# Patient Record
Sex: Female | Born: 1960 | Race: Black or African American | Hispanic: No | Smoking: Former smoker
Health system: Southern US, Community
[De-identification: ages and names within clinical notes are randomized; demographics above are authoritative.]

## PROBLEM LIST (undated history)

## (undated) DIAGNOSIS — Z923 Personal history of irradiation: Secondary | ICD-10-CM

## (undated) DIAGNOSIS — E785 Hyperlipidemia, unspecified: Secondary | ICD-10-CM

## (undated) DIAGNOSIS — C3490 Malignant neoplasm of unspecified part of unspecified bronchus or lung: Secondary | ICD-10-CM

## (undated) DIAGNOSIS — C7931 Secondary malignant neoplasm of brain: Secondary | ICD-10-CM

## (undated) DIAGNOSIS — M4800 Spinal stenosis, site unspecified: Secondary | ICD-10-CM

## (undated) DIAGNOSIS — D509 Iron deficiency anemia, unspecified: Secondary | ICD-10-CM

## (undated) DIAGNOSIS — I10 Essential (primary) hypertension: Secondary | ICD-10-CM

## (undated) DIAGNOSIS — C7951 Secondary malignant neoplasm of bone: Secondary | ICD-10-CM

## (undated) DIAGNOSIS — D519 Vitamin B12 deficiency anemia, unspecified: Secondary | ICD-10-CM

## (undated) DIAGNOSIS — E43 Unspecified severe protein-calorie malnutrition: Secondary | ICD-10-CM

## (undated) HISTORY — PX: TUBAL LIGATION: SHX77

---

## 2000-07-24 ENCOUNTER — Emergency Department (HOSPITAL_COMMUNITY): Admission: EM | Admit: 2000-07-24 | Discharge: 2000-07-25 | Payer: Self-pay | Admitting: *Deleted

## 2000-07-26 ENCOUNTER — Emergency Department (HOSPITAL_COMMUNITY): Admission: EM | Admit: 2000-07-26 | Discharge: 2000-07-26 | Payer: Self-pay | Admitting: *Deleted

## 2003-01-28 ENCOUNTER — Emergency Department (HOSPITAL_COMMUNITY): Admission: EM | Admit: 2003-01-28 | Discharge: 2003-01-28 | Payer: Self-pay | Admitting: Emergency Medicine

## 2003-09-25 ENCOUNTER — Emergency Department (HOSPITAL_COMMUNITY): Admission: EM | Admit: 2003-09-25 | Discharge: 2003-09-25 | Payer: Self-pay | Admitting: Emergency Medicine

## 2005-08-08 ENCOUNTER — Ambulatory Visit (HOSPITAL_COMMUNITY): Admission: RE | Admit: 2005-08-08 | Discharge: 2005-08-08 | Payer: Self-pay | Admitting: Obstetrics and Gynecology

## 2006-03-20 ENCOUNTER — Emergency Department (HOSPITAL_COMMUNITY): Admission: EM | Admit: 2006-03-20 | Discharge: 2006-03-20 | Payer: Self-pay | Admitting: Emergency Medicine

## 2007-05-17 ENCOUNTER — Emergency Department (HOSPITAL_COMMUNITY): Admission: EM | Admit: 2007-05-17 | Discharge: 2007-05-17 | Payer: Self-pay | Admitting: Emergency Medicine

## 2010-03-05 ENCOUNTER — Encounter: Payer: Self-pay | Admitting: Family Medicine

## 2011-08-22 ENCOUNTER — Encounter (HOSPITAL_COMMUNITY): Payer: Self-pay | Admitting: *Deleted

## 2011-08-22 ENCOUNTER — Emergency Department (HOSPITAL_COMMUNITY): Payer: Self-pay

## 2011-08-22 ENCOUNTER — Emergency Department (HOSPITAL_COMMUNITY)
Admission: EM | Admit: 2011-08-22 | Discharge: 2011-08-22 | Disposition: A | Discharge status: Home / Self Care | Payer: Self-pay | Attending: Emergency Medicine | Admitting: Emergency Medicine

## 2011-08-22 DIAGNOSIS — R51 Headache: Secondary | ICD-10-CM | POA: Insufficient documentation

## 2011-08-22 DIAGNOSIS — F17200 Nicotine dependence, unspecified, uncomplicated: Secondary | ICD-10-CM | POA: Insufficient documentation

## 2011-08-22 DIAGNOSIS — I10 Essential (primary) hypertension: Secondary | ICD-10-CM | POA: Insufficient documentation

## 2011-08-22 DIAGNOSIS — R519 Headache, unspecified: Secondary | ICD-10-CM

## 2011-08-22 HISTORY — DX: Essential (primary) hypertension: I10

## 2011-08-22 MED ORDER — NAPROXEN 500 MG PO TABS: 500.0000 mg | ORAL_TABLET | Freq: Two times a day (BID) | ORAL | Status: AC

## 2011-08-22 MED ORDER — OXYCODONE-ACETAMINOPHEN 5-325 MG PO TABS: 1.0000 | ORAL_TABLET | Freq: Four times a day (QID) | ORAL | Status: AC | PRN

## 2011-08-22 MED ORDER — OXYCODONE-ACETAMINOPHEN 5-325 MG PO TABS
1.0000 | ORAL_TABLET | Freq: Once | ORAL | Status: AC
  Administered 2011-08-22: 1 via ORAL
  Filled 2011-08-22: qty 1

## 2011-08-22 NOTE — ED Notes (Signed)
Pain rt periorbital region,  No injury.  Alert,

## 2011-08-22 NOTE — ED Provider Notes (Signed)
History     CSN: [id number]  Arrival date & time 08/22/11  1553   First MD Initiated Contact with Patient 08/22/11 1618      Chief Complaint  Patient presents with  . Facial Pain    (Consider location/radiation/quality/duration/timing/severity/associated sxs/prior treatment) Patient is a 51 y.o. female presenting with headaches. The history is provided by the patient (the pt complains of pain on the right side of her face). No language interpreter was used.  Headache  This is a new problem. The current episode started 2 days ago. The problem occurs constantly. The problem has not changed since onset.The headache is associated with nothing. The pain is located in the right unilateral region. The quality of the pain is described as sharp. The pain is at a severity of 6/10. The pain does not radiate. She has tried acetaminophen for the symptoms. The treatment provided no relief.    Past Medical History  Diagnosis Date  . Hypertension     History reviewed. No pertinent past surgical history.  History reviewed. No pertinent family history.  History  Substance Use Topics  . Smoking status: Current Everyday Smoker  . Smokeless tobacco: Not on file  . Alcohol Use: No    OB History    Grav Para Term Preterm Abortions TAB SAB Ect Mult Living                  Review of Systems  Constitutional: Negative for fatigue.  HENT: Negative for congestion, sinus pressure and ear discharge.        Facial pain  Eyes: Negative for discharge.  Respiratory: Negative for cough.   Cardiovascular: Negative for chest pain.  Gastrointestinal: Negative for abdominal pain and diarrhea.  Genitourinary: Negative for frequency and hematuria.  Musculoskeletal: Negative for back pain.  Skin: Negative for rash.  Neurological: Positive for headaches. Negative for seizures.  Hematological: Negative.   Psychiatric/Behavioral: Negative for hallucinations.    Allergies  Review of patient's allergies  indicates no known allergies.  Home Medications   Current Outpatient Rx  Name Route Sig Dispense Refill  . AMLODIPINE BESYLATE 10 MG PO TABS Oral Take 10 mg by mouth daily.    . IBUPROFEN 200 MG PO TABS Oral Take 800 mg by mouth as needed. For pain    . NAPROXEN 500 MG PO TABS Oral Take 1 tablet (500 mg total) by mouth 2 (two) times daily. 30 tablet 0  . OXYCODONE-ACETAMINOPHEN 5-325 MG PO TABS Oral Take 1 tablet by mouth every 6 (six) hours as needed for pain. 30 tablet 0    BP 145/84  Pulse 79  Temp 98.2 F (36.8 C) (Oral)  Resp 20  Ht 5\' 6"  (1.676 m)  Wt 160 lb (72.576 kg)  BMI 25.82 kg/m2  SpO2 98%  Physical Exam  Constitutional: She is oriented to person, place, and time. She appears well-developed.  HENT:  Head: Normocephalic and atraumatic.       Mild tenderness right cheek  Eyes: Conjunctivae and EOM are normal. No scleral icterus.  Neck: Neck supple. No thyromegaly present.  Cardiovascular: Normal rate and regular rhythm.  Exam reveals no gallop and no friction rub.   No murmur heard. Pulmonary/Chest: No stridor. She has no wheezes. She has no rales. She exhibits no tenderness.  Abdominal: She exhibits no distension. There is no tenderness. There is no rebound.  Musculoskeletal: Normal range of motion. She exhibits no edema.  Lymphadenopathy:    She has no cervical adenopathy.  Neurological: She is oriented to person, place, and time. Coordination normal.  Skin: No rash noted. No erythema.  Psychiatric: She has a normal mood and affect. Her behavior is normal.    ED Course  Procedures (including critical care time)  Labs Reviewed - No data to display Ct Head Wo Contrast  08/22/2011  *RADIOLOGY REPORT*  Clinical Data: Pain and headache  CT HEAD WITHOUT CONTRAST  Technique:  Contiguous axial images were obtained from the base of the skull through the vertex without contrast.  Comparison: None.  Findings: The brain has a normal appearance without evidence for  hemorrhage, infarction, hydrocephalus, or mass lesion.  There is no extra axial fluid collection.  The skull and paranasal sinuses are normal.  IMPRESSION: Negative exam.  Original Report Authenticated By: Rosealee Albee, M.D.     1. Facial pain       MDM          Benny Lennert, MD 08/22/11 [phone]

## 2011-08-22 NOTE — Discharge Instructions (Signed)
Follow up in 1-2 weeks if not improving. °

## 2012-11-04 ENCOUNTER — Other Ambulatory Visit (HOSPITAL_COMMUNITY): Payer: Self-pay | Admitting: Nurse Practitioner

## 2012-11-04 DIAGNOSIS — Z139 Encounter for screening, unspecified: Secondary | ICD-10-CM

## 2012-11-07 ENCOUNTER — Ambulatory Visit (HOSPITAL_COMMUNITY)
Admission: RE | Admit: 2012-11-07 | Discharge: 2012-11-07 | Disposition: A | Discharge status: Home / Self Care | Payer: PRIVATE HEALTH INSURANCE | Source: Ambulatory Visit | Attending: Nurse Practitioner | Admitting: Nurse Practitioner

## 2012-11-07 DIAGNOSIS — Z139 Encounter for screening, unspecified: Secondary | ICD-10-CM

## 2012-11-07 DIAGNOSIS — Z1231 Encounter for screening mammogram for malignant neoplasm of breast: Secondary | ICD-10-CM | POA: Insufficient documentation

## 2013-07-03 ENCOUNTER — Emergency Department (HOSPITAL_COMMUNITY): Payer: Self-pay

## 2013-07-03 ENCOUNTER — Emergency Department (HOSPITAL_COMMUNITY)
Admission: EM | Admit: 2013-07-03 | Discharge: 2013-07-03 | Disposition: A | Discharge status: Home / Self Care | Payer: Self-pay | Attending: Emergency Medicine | Admitting: Emergency Medicine

## 2013-07-03 ENCOUNTER — Encounter (HOSPITAL_COMMUNITY): Payer: Self-pay | Admitting: Emergency Medicine

## 2013-07-03 DIAGNOSIS — Y9389 Activity, other specified: Secondary | ICD-10-CM | POA: Insufficient documentation

## 2013-07-03 DIAGNOSIS — Y9289 Other specified places as the place of occurrence of the external cause: Secondary | ICD-10-CM | POA: Insufficient documentation

## 2013-07-03 DIAGNOSIS — M533 Sacrococcygeal disorders, not elsewhere classified: Secondary | ICD-10-CM

## 2013-07-03 DIAGNOSIS — F17200 Nicotine dependence, unspecified, uncomplicated: Secondary | ICD-10-CM | POA: Insufficient documentation

## 2013-07-03 DIAGNOSIS — I10 Essential (primary) hypertension: Secondary | ICD-10-CM | POA: Insufficient documentation

## 2013-07-03 DIAGNOSIS — IMO0002 Reserved for concepts with insufficient information to code with codable children: Secondary | ICD-10-CM | POA: Insufficient documentation

## 2013-07-03 DIAGNOSIS — W19XXXA Unspecified fall, initial encounter: Secondary | ICD-10-CM

## 2013-07-03 DIAGNOSIS — Z79899 Other long term (current) drug therapy: Secondary | ICD-10-CM | POA: Insufficient documentation

## 2013-07-03 DIAGNOSIS — W010XXA Fall on same level from slipping, tripping and stumbling without subsequent striking against object, initial encounter: Secondary | ICD-10-CM | POA: Insufficient documentation

## 2013-07-03 DIAGNOSIS — Z791 Long term (current) use of non-steroidal anti-inflammatories (NSAID): Secondary | ICD-10-CM | POA: Insufficient documentation

## 2013-07-03 MED ORDER — IBUPROFEN 800 MG PO TABS: 800.0000 mg | ORAL_TABLET | Freq: Three times a day (TID) | ORAL | Status: DC

## 2013-07-03 MED ORDER — TRAMADOL HCL 50 MG PO TABS: 50.0000 mg | ORAL_TABLET | Freq: Four times a day (QID) | ORAL | Status: DC | PRN

## 2013-07-03 NOTE — ED Notes (Signed)
Fell mopping floor.  Slipped on wet floor and fell onto back.  Since that time has had increasing coccyx pain.  Denies radiation of pain or loss of bowel or bladder function.

## 2013-07-03 NOTE — ED Notes (Signed)
Fell on Tuesday, slipped on wet floor. Pain sacral-coccyx region

## 2013-07-03 NOTE — Discharge Instructions (Signed)
Tailbone Injury °The tailbone (coccyx) is the small bone at the lower end of the spine. A tailbone injury may involve stretched ligaments, bruising, or a broken bone (fracture). Women are more vulnerable to this injury due to having a wider pelvis. °CAUSES  °This type of injury typically occurs from falling and landing on the tailbone. Repeated strain or friction from actions such as rowing and bicycling may also injure the area. The tailbone can be injured during childbirth. Infections or tumors may also press on the tailbone and cause pain. Sometimes, the cause of injury is unknown. °SYMPTOMS  °· Bruising. °· Pain when sitting. °· Painful bowel movements. °· In women, pain during intercourse. °DIAGNOSIS  °Your caregiver can diagnose a tailbone injury based on your symptoms and a physical exam. X-rays may be taken if a fracture is suspected. Your caregiver may also use an MRI scan imaging test to evaluate your symptoms. °TREATMENT  °Your caregiver may prescribe medicines to help relieve your pain. Most tailbone injuries heal on their own in 4 to 6 weeks. However, if the injury is caused by an infection or tumor, the recovery period may vary. °PREVENTION  °Wear appropriate padding and sports gear when bicycling and rowing. This can help prevent an injury from repeated strain or friction. °HOME CARE INSTRUCTIONS  °· Put ice on the injured area. °· Put ice in a plastic bag. °· Place a towel between your skin and the bag. °· Leave the ice on for 15-20 minutes, every hour while awake for the first 1 to 2 days. °· Sit on a large, rubber or inflated ring or cushion to ease your pain. Lean forward when sitting to help decrease discomfort. °· Avoid sitting for long periods of time. °· Increase your activity as the pain allows. °· Only take over-the-counter or prescription medicines for pain, discomfort, or fever as directed by your caregiver. °· You may use stool softeners if it is painful to have a bowel movement, or as  directed by your caregiver. °· Eat a diet with plenty of fiber to help prevent constipation. °· Keep all follow-up appointments as directed by your caregiver. °SEEK MEDICAL CARE IF:  °· Your pain becomes worse. °· Your bowel movements cause a great deal of discomfort. °· You are unable to have a bowel movement. °· You have a fever. °MAKE SURE YOU: °· Understand these instructions. °· Will watch your condition. °· Will get help right away if you are not doing well or get worse. °Document Released: 01/27/2000 Document Revised: 04/23/2011 Document Reviewed: 08/24/2010 °[employer]® Patient Information ©2014 [employer]. ° °

## 2013-07-06 NOTE — ED Provider Notes (Signed)
CSN: [id number]     Arrival date & time 07/03/13  1652 History   First MD Initiated Contact with Patient 07/03/13 1953     Chief Complaint  Patient presents with  . Fall     (Consider location/radiation/quality/duration/timing/severity/associated sxs/prior Treatment) Patient is a 53 y.o. female presenting with back pain. The history is provided by the patient.  Back Pain Location:  Gluteal region Quality:  Aching and shooting Radiates to:  Does not radiate Pain severity:  Severe Pain is:  Same all the time Onset quality:  Sudden Timing:  Constant Progression:  Unchanged Chronicity:  New Context: falling   Context comment:  Patient c/o pain to her tailbone after mechanical fall on hard floor.   Relieved by:  Being still and lying down Worsened by:  Bending, sitting and twisting Ineffective treatments:  OTC medications Associated symptoms: no abdominal pain, no abdominal swelling, no bladder incontinence, no bowel incontinence, no chest pain, no dysuria, no fever, no headaches, no leg pain, no numbness, no paresthesias, no pelvic pain, no perianal numbness, no tingling and no weakness     Past Medical History  Diagnosis Date  . Hypertension    History reviewed. No pertinent past surgical history. History reviewed. No pertinent family history. History  Substance Use Topics  . Smoking status: Current Every Day Smoker -- 0.50 packs/day    Types: Cigarettes  . Smokeless tobacco: Not on file  . Alcohol Use: No   OB History   Grav Para Term Preterm Abortions TAB SAB Ect Mult Living                 Review of Systems  Constitutional: Negative for fever.  Respiratory: Negative for chest tightness and shortness of breath.   Cardiovascular: Negative for chest pain.  Gastrointestinal: Negative for vomiting, abdominal pain, constipation and bowel incontinence.  Genitourinary: Negative for bladder incontinence, dysuria, hematuria, flank pain, decreased urine volume, vaginal  bleeding, difficulty urinating and pelvic pain.       No perineal numbness or incontinence of urine or feces  Musculoskeletal: Positive for back pain. Negative for joint swelling and neck pain.  Skin: Negative for rash.  Neurological: Negative for tingling, syncope, weakness, numbness, headaches and paresthesias.  All other systems reviewed and are negative.     Allergies  Review of patient's allergies indicates no known allergies.  Home Medications   Prior to Admission medications   Medication Sig Start Date End Date Taking? Authorizing Provider  acetaminophen (TYLENOL) 500 MG tablet Take 1,000 mg by mouth daily as needed for mild pain or moderate pain.   Yes Historical Provider, MD  amLODipine (NORVASC) 10 MG tablet Take 10 mg by mouth daily.   Yes Historical Provider, MD  ibuprofen (ADVIL,MOTRIN) 800 MG tablet Take 1 tablet (800 mg total) by mouth 3 (three) times daily. Take with food 07/03/13   Sebastyan Snodgrass L. Jabree Rebert, PA-C  traMADol (ULTRAM) 50 MG tablet Take 1 tablet (50 mg total) by mouth every 6 (six) hours as needed. 07/03/13   Keidy Thurgood L. Seraphim Affinito, PA-C   BP 148/79  Pulse 72  Temp(Src) 97.5 F (36.4 C) (Oral)  Resp 24  Wt 172 lb (78.019 kg)  SpO2 100% Physical Exam  Nursing note and vitals reviewed. Constitutional: She is oriented to person, place, and time. She appears well-developed and well-nourished. No distress.  HENT:  Head: Normocephalic and atraumatic.  Neck: Normal range of motion. Neck supple.  Cardiovascular: Normal rate, regular rhythm, normal heart sounds and intact distal pulses.  No murmur heard. Pulmonary/Chest: Effort normal and breath sounds normal. No respiratory distress. She exhibits no tenderness.  Abdominal: Soft. She exhibits no distension. There is no tenderness. There is no rebound and no guarding.  Musculoskeletal: She exhibits tenderness. She exhibits no edema.       Lumbar back: She exhibits tenderness, bony tenderness and pain. She exhibits normal  range of motion, no swelling, no deformity, no laceration and normal pulse.  Localized ttp of the  Coccyx.  No edema.  DP pulses are brisk and symmetrical.  Distal sensation intact.  Hip Flexors/Extensors are intact.  Pt has normal strength against resistance of bilateral lower extremities.     Neurological: She is alert and oriented to person, place, and time. She has normal strength. No sensory deficit. She exhibits normal muscle tone. Coordination and gait normal.  Reflex Scores:      Patellar reflexes are 2+ on the right side and 2+ on the left side.      Achilles reflexes are 2+ on the right side and 2+ on the left side. Skin: Skin is warm and dry. No rash noted.    ED Course  Procedures (including critical care time) Labs Review Labs Reviewed - No data to display  Imaging Review Dg Sacrum/coccyx  07/03/2013   CLINICAL DATA:  Patient fell 3 days ago and has pain in the coccyx.  EXAM: SACRUM AND COCCYX - 2+ VIEW  COMPARISON:  None.  FINDINGS: The mineralization and alignment are normal. There is no evidence of acute fracture or dislocation. The sacroiliac joints are intact. There are mild degenerative changes at the symphysis pubis.  IMPRESSION: No evidence of sacrococcygeal fracture.   Electronically Signed   By: Camie Patience M.D.   On: 07/03/2013 18:52      EKG Interpretation None      MDM   Final diagnoses:  Acute coccygeal pain  Fall    Pt with pain localized to the coccyx only.  XR neg for fx.  Discussed with pt possibility of occult fx and she verbalized understanding and agrees to use "dougnut ring" ibuprofen, and ultram for pain.  She is ambulatory, no focal neuro deficits. No concerning sx's for emergent neurological process.  VSS and pt appears stable for d/c    Merary Garguilo L. Amer Alcindor, PA-C 07/06/13 1608

## 2013-07-08 NOTE — ED Provider Notes (Signed)
Medical screening examination/treatment/procedure(s) were performed by non-physician practitioner and as supervising physician I was immediately available for consultation/collaboration.   EKG Interpretation None        Alfonzo Feller, DO 07/08/13 1054

## 2014-01-15 ENCOUNTER — Telehealth: Payer: Self-pay | Admitting: Physician Assistant

## 2014-01-17 NOTE — Telephone Encounter (Signed)
Opened in error

## 2014-08-19 ENCOUNTER — Other Ambulatory Visit (HOSPITAL_COMMUNITY): Payer: Self-pay | Admitting: *Deleted

## 2014-08-19 DIAGNOSIS — Z1231 Encounter for screening mammogram for malignant neoplasm of breast: Secondary | ICD-10-CM

## 2014-09-13 ENCOUNTER — Ambulatory Visit (HOSPITAL_COMMUNITY): Payer: Self-pay

## 2014-10-11 ENCOUNTER — Ambulatory Visit (HOSPITAL_COMMUNITY): Payer: Self-pay

## 2014-10-21 ENCOUNTER — Emergency Department (HOSPITAL_COMMUNITY)
Admission: EM | Admit: 2014-10-21 | Discharge: 2014-10-21 | Disposition: A | Discharge status: Home / Self Care | Payer: Self-pay | Attending: Emergency Medicine | Admitting: Emergency Medicine

## 2014-10-21 ENCOUNTER — Encounter (HOSPITAL_COMMUNITY): Payer: Self-pay | Admitting: Emergency Medicine

## 2014-10-21 DIAGNOSIS — I10 Essential (primary) hypertension: Secondary | ICD-10-CM | POA: Insufficient documentation

## 2014-10-21 DIAGNOSIS — H109 Unspecified conjunctivitis: Secondary | ICD-10-CM | POA: Insufficient documentation

## 2014-10-21 DIAGNOSIS — Z720 Tobacco use: Secondary | ICD-10-CM | POA: Insufficient documentation

## 2014-10-21 DIAGNOSIS — Z79899 Other long term (current) drug therapy: Secondary | ICD-10-CM | POA: Insufficient documentation

## 2014-10-21 MED ORDER — TOBRAMYCIN 0.3 % OP SOLN: 2.0000 [drp] | OPHTHALMIC | Status: DC

## 2014-10-21 NOTE — ED Notes (Signed)
Pt c/o drainage coming from L eye for approx last week

## 2014-10-21 NOTE — ED Provider Notes (Signed)
CSN: [id number]     Arrival date & time 10/21/14  9371 History   First MD Initiated Contact with Patient 10/21/14 0827     Chief Complaint  Patient presents with  . Eye Drainage     (Consider location/radiation/quality/duration/timing/severity/associated sxs/prior Treatment) Patient is a 54 y.o. female presenting with conjunctivitis. The history is provided by the patient. No language interpreter was used.  Conjunctivitis This is a new problem. The current episode started in the past 7 days. The problem occurs constantly. The problem has been gradually worsening. Pertinent negatives include no fever. Nothing aggravates the symptoms. She has tried nothing for the symptoms. The treatment provided mild relief.    Past Medical History  Diagnosis Date  . Hypertension    History reviewed. No pertinent past surgical history. No family history on file. Social History  Substance Use Topics  . Smoking status: Current Every Day Smoker -- 0.50 packs/day for 5 years    Types: Cigarettes  . Smokeless tobacco: None  . Alcohol Use: No   OB History    Gravida Para Term Preterm AB TAB SAB Ectopic Multiple Living   9 8             Review of Systems  Constitutional: Negative for fever.  Eyes: Positive for discharge, redness and itching.  All other systems reviewed and are negative.     Allergies  Review of patient's allergies indicates no known allergies.  Home Medications   Prior to Admission medications   Medication Sig Start Date End Date Taking? Authorizing Provider  acetaminophen (TYLENOL) 500 MG tablet Take 1,000 mg by mouth daily as needed for mild pain or moderate pain.    Historical Provider, MD  amLODipine (NORVASC) 10 MG tablet Take 10 mg by mouth daily.    Historical Provider, MD  ibuprofen (ADVIL,MOTRIN) 800 MG tablet Take 1 tablet (800 mg total) by mouth 3 (three) times daily. Take with food 07/03/13   Tammy Triplett, PA-C  traMADol (ULTRAM) 50 MG tablet Take 1 tablet (50  mg total) by mouth every 6 (six) hours as needed. 07/03/13   Tammy Triplett, PA-C   BP 165/94 mmHg  Pulse 83  Temp(Src) 98.2 F (36.8 C) (Oral)  Resp 18  Ht '5\' 6"'$  (1.676 m)  Wt 172 lb (78.019 kg)  BMI 27.77 kg/m2  SpO2 100% Physical Exam  Constitutional: She is oriented to person, place, and time. She appears well-developed and well-nourished.  HENT:  Head: Normocephalic.  Eyes: EOM are normal. Pupils are equal, round, and reactive to light.  Injected conjunctiva, drainage dorner of eye  Neck: Normal range of motion.  Pulmonary/Chest: Effort normal.  Abdominal: She exhibits no distension.  Musculoskeletal: Normal range of motion.  Neurological: She is alert and oriented to person, place, and time.  Skin: Skin is warm.  Psychiatric: She has a normal mood and affect.  Nursing note and vitals reviewed.   ED Course  Procedures (including critical care time) Labs Review Labs Reviewed - No data to display  Imaging Review No results found. I have personally reviewed and evaluated these images and lab results as part of my medical decision-making.   EKG Interpretation None      MDM  Pt works in a day care   Final diagnoses:  Conjunctivitis, left eye    tobrex Cool compresses Work note Recheck in 2-3 days if not improving    Fransico Meadow, PA-C 10/21/14 [place], MD 10/21/14 [phone]

## 2014-10-21 NOTE — ED Notes (Signed)
MD at bedside. 

## 2014-10-21 NOTE — Discharge Instructions (Signed)

## 2014-10-21 NOTE — ED Notes (Signed)
Discharge information reviewed with pt and understanding was reflected by same. Pt departed under her own power and in NAD.

## 2014-11-11 ENCOUNTER — Ambulatory Visit (HOSPITAL_COMMUNITY)
Admission: RE | Admit: 2014-11-11 | Discharge: 2014-11-11 | Disposition: A | Discharge status: Home / Self Care | Payer: PRIVATE HEALTH INSURANCE | Source: Ambulatory Visit | Attending: *Deleted | Admitting: *Deleted

## 2014-11-11 DIAGNOSIS — Z1231 Encounter for screening mammogram for malignant neoplasm of breast: Secondary | ICD-10-CM

## 2014-11-16 ENCOUNTER — Other Ambulatory Visit (HOSPITAL_COMMUNITY): Payer: Self-pay | Admitting: *Deleted

## 2014-11-16 ENCOUNTER — Other Ambulatory Visit: Payer: Self-pay | Admitting: *Deleted

## 2014-11-16 DIAGNOSIS — R928 Other abnormal and inconclusive findings on diagnostic imaging of breast: Secondary | ICD-10-CM

## 2014-11-16 DIAGNOSIS — N6310 Unspecified lump in the right breast, unspecified quadrant: Secondary | ICD-10-CM

## 2014-11-30 ENCOUNTER — Ambulatory Visit (HOSPITAL_COMMUNITY)
Admission: RE | Admit: 2014-11-30 | Discharge: 2014-11-30 | Disposition: A | Discharge status: Home / Self Care | Payer: PRIVATE HEALTH INSURANCE | Source: Ambulatory Visit | Attending: *Deleted | Admitting: *Deleted

## 2014-11-30 DIAGNOSIS — N6310 Unspecified lump in the right breast, unspecified quadrant: Secondary | ICD-10-CM

## 2014-11-30 DIAGNOSIS — R928 Other abnormal and inconclusive findings on diagnostic imaging of breast: Secondary | ICD-10-CM | POA: Diagnosis not present

## 2014-11-30 DIAGNOSIS — Z803 Family history of malignant neoplasm of breast: Secondary | ICD-10-CM | POA: Diagnosis not present

## 2014-11-30 DIAGNOSIS — N6001 Solitary cyst of right breast: Secondary | ICD-10-CM | POA: Insufficient documentation

## 2015-06-09 ENCOUNTER — Other Ambulatory Visit (HOSPITAL_COMMUNITY): Payer: Self-pay | Admitting: *Deleted

## 2015-06-09 DIAGNOSIS — Z09 Encounter for follow-up examination after completed treatment for conditions other than malignant neoplasm: Secondary | ICD-10-CM

## 2015-06-09 DIAGNOSIS — N6001 Solitary cyst of right breast: Secondary | ICD-10-CM

## 2015-06-14 ENCOUNTER — Inpatient Hospital Stay (HOSPITAL_COMMUNITY): Admission: RE | Admit: 2015-06-14 | Payer: PRIVATE HEALTH INSURANCE | Source: Ambulatory Visit

## 2015-06-14 ENCOUNTER — Ambulatory Visit (HOSPITAL_COMMUNITY)
Admission: RE | Admit: 2015-06-14 | Discharge: 2015-06-14 | Disposition: A | Discharge status: Home / Self Care | Payer: PRIVATE HEALTH INSURANCE | Source: Ambulatory Visit | Attending: *Deleted | Admitting: *Deleted

## 2015-06-14 DIAGNOSIS — Z09 Encounter for follow-up examination after completed treatment for conditions other than malignant neoplasm: Secondary | ICD-10-CM | POA: Diagnosis present

## 2015-06-14 DIAGNOSIS — N6001 Solitary cyst of right breast: Secondary | ICD-10-CM | POA: Insufficient documentation

## 2015-10-01 ENCOUNTER — Emergency Department (HOSPITAL_COMMUNITY)
Admission: EM | Admit: 2015-10-01 | Discharge: 2015-10-01 | Disposition: A | Discharge status: Home / Self Care | Payer: Self-pay | Attending: Emergency Medicine | Admitting: Emergency Medicine

## 2015-10-01 ENCOUNTER — Encounter (HOSPITAL_COMMUNITY): Payer: Self-pay | Admitting: *Deleted

## 2015-10-01 DIAGNOSIS — Z791 Long term (current) use of non-steroidal anti-inflammatories (NSAID): Secondary | ICD-10-CM | POA: Insufficient documentation

## 2015-10-01 DIAGNOSIS — K029 Dental caries, unspecified: Secondary | ICD-10-CM | POA: Insufficient documentation

## 2015-10-01 DIAGNOSIS — I10 Essential (primary) hypertension: Secondary | ICD-10-CM | POA: Insufficient documentation

## 2015-10-01 DIAGNOSIS — F17210 Nicotine dependence, cigarettes, uncomplicated: Secondary | ICD-10-CM | POA: Insufficient documentation

## 2015-10-01 DIAGNOSIS — R0781 Pleurodynia: Secondary | ICD-10-CM | POA: Insufficient documentation

## 2015-10-01 DIAGNOSIS — Z79899 Other long term (current) drug therapy: Secondary | ICD-10-CM | POA: Insufficient documentation

## 2015-10-01 DIAGNOSIS — R0981 Nasal congestion: Secondary | ICD-10-CM

## 2015-10-01 MED ORDER — FLUTICASONE PROPIONATE 50 MCG/ACT NA SUSP: 2.0000 | Freq: Every day | NASAL | 0 refills | Status: DC

## 2015-10-01 MED ORDER — PENICILLIN V POTASSIUM 250 MG PO TABS: 250.0000 mg | ORAL_TABLET | Freq: Four times a day (QID) | ORAL | 0 refills | Status: DC

## 2015-10-01 MED ORDER — NAPROXEN 250 MG PO TABS
250.0000 mg | ORAL_TABLET | Freq: Two times a day (BID) | ORAL | 0 refills | Status: DC | PRN
Start: 2015-10-01 — End: 2016-05-24

## 2015-10-01 NOTE — ED Notes (Signed)
Pt made aware to return if symptoms worsen or if any life threatening symptoms occur.   

## 2015-10-01 NOTE — ED Notes (Signed)
Dr. McManus at bedside. 

## 2015-10-01 NOTE — ED Triage Notes (Signed)
Pt comes in with left face pain. Pt staets she feels pressure in her left cheek area. She occasionally has pain in her left ear. She denies any nasal congestion, cough, or runny nose.

## 2015-10-01 NOTE — ED Provider Notes (Signed)
[facility] DEPT Provider Note   CSN: [id number] Arrival date & time: 10/01/15  2355     History   Chief Complaint Chief Complaint  Patient presents with  . Facial Pain    HPI Heather [facility] is a 55 y.o. female.  HPI  Pt was seen at 0735.   Per pt, c/o gradual onset and persistence of constant runny/stuffy nose and sinus congestion for the past 2-3 days. Has been associated with left sided face and ear "pain."  Denies injury, no teeth pain, no fevers, no rash, no CP/SOB, no N/V/D, no abd pain.    Past Medical History:  Diagnosis Date  . Hypertension     There are no active problems to display for this patient.   Past Surgical History:  Procedure Laterality Date  . TUBAL LIGATION      OB History    Gravida Para Term Preterm AB Living   9 8           SAB TAB Ectopic Multiple Live Births                   Home Medications    Prior to Admission medications   Medication Sig Start Date End Date Taking? Authorizing Provider  acetaminophen (TYLENOL) 500 MG tablet Take 1,000 mg by mouth daily as needed for mild pain or moderate pain.    Historical Provider, MD  amLODipine (NORVASC) 10 MG tablet Take 10 mg by mouth daily.    Historical Provider, MD  ibuprofen (ADVIL,MOTRIN) 800 MG tablet Take 1 tablet (800 mg total) by mouth 3 (three) times daily. Take with food 07/03/13   Tammy Triplett, PA-C  tobramycin (TOBREX) 0.3 % ophthalmic solution Place 2 drops into the left eye every 4 (four) hours. 10/21/14   Fransico Meadow, PA-C  traMADol (ULTRAM) 50 MG tablet Take 1 tablet (50 mg total) by mouth every 6 (six) hours as needed. 07/03/13   Tammy Triplett, PA-C    Family History No family history on file.  Social History Social History  Substance Use Topics  . Smoking status: Current Every Day Smoker    Packs/day: 0.50    Years: 5.00    Types: Cigarettes  . Smokeless tobacco: Never Used  . Alcohol use No     Allergies   Review of patient's allergies indicates no  known allergies.   Review of Systems Review of Systems ROS: Statement: All systems negative except as marked or noted in the HPI; Constitutional: Negative for fever and chills. ; ; Eyes: Negative for eye pain, redness and discharge. ; ; ENMT: Negative for ear pain, hoarseness, sore throat. +nasal congestion, sinus pressure and rhinorrhea.. ; ; Cardiovascular: Negative for chest pain, palpitations, diaphoresis, dyspnea and peripheral edema. ; ; Respiratory: Negative for cough, wheezing and stridor. ; ; Gastrointestinal: Negative for nausea, vomiting, diarrhea, abdominal pain, blood in stool, hematemesis, jaundice and rectal bleeding. . ; ; Genitourinary: Negative for dysuria, flank pain and hematuria. ; ; Musculoskeletal: Negative for back pain and neck pain. Negative for swelling and trauma.; ; Skin: Negative for pruritus, rash, abrasions, blisters, bruising and skin lesion.; ; Neuro: Negative for headache, lightheadedness and neck stiffness. Negative for weakness, altered level of consciousness, altered mental status, extremity weakness, paresthesias, involuntary movement, seizure and syncope.       Physical Exam Updated Vital Signs BP 110/82 (BP Location: Left Arm)   Pulse 80   Temp 97.9 F (36.6 C) (Oral)   Resp 16   Ht 5'  4" (1.626 m)   Wt 170 lb (77.1 kg)   SpO2 99%   BMI 29.18 kg/m   Physical Exam 0740: Physical examination: Vital signs and O2 SAT: Reviewed; Constitutional: Well developed, Well nourished, Well hydrated, In no acute distress; Head and Face: Normocephalic, Atraumatic; Eyes: EOMI, PERRL, No scleral icterus; ENMT: Mouth and pharynx normal, Poor dentition, Widespread dental decay, Left TM normal, Right TM normal, +edemetous nasal turbinates bilat with clear rhinorrhea. +tender to percuss left maxillary sinus. Mucous membranes moist, Multiple missing teeth. +upper left remaining molars with extensive dental decay. No gingival erythema, edema, fluctuance, or drainage.  No  intra-oral edema. No submandibular or sublingual edema. No hoarse voice, no drooling, no stridor. No trismus. ; Neck: Supple, Full range of motion, No lymphadenopathy; Cardiovascular: Regular rate and rhythm, No murmur, rub, or gallop; Respiratory: Breath sounds clear & equal bilaterally, No rales, rhonchi, wheezes, Normal respiratory effort/excursion; Chest: Nontender, Movement normal; Extremities: Pulses normal, No tenderness, No edema; Neuro: AA&Ox3, Major CN grossly intact.  No gross focal motor or sensory deficits in extremities.; Skin: Color normal, No rash, No petechiae, Warm, Dry   ED Treatments / Results  Labs (all labs ordered are listed, but only abnormal results are displayed)   EKG  EKG Interpretation None       Radiology   Procedures Procedures (including critical care time)  Medications Ordered in ED Medications - No data to display   Initial Impression / Assessment and Plan / ED Course  I have reviewed the triage vital signs and the nursing notes.  Pertinent labs & imaging results that were available during my care of the patient were reviewed by me and considered in my medical decision making (see chart for details).   MDM Reviewed: previous chart, nursing note and vitals    0745:  Pt with widespread dental decay and sinus congestion. Tx dental decay with PCN and symptomatically for sinusitis. Dx d/w pt.  Questions answered.  Verb understanding, agreeable to d/c home with outpt f/u.   Final Clinical Impressions(s) / ED Diagnoses   Final diagnoses:  None    New Prescriptions New Prescriptions   No medications on file     Francine Graven, DO 10/04/15 1507

## 2015-10-01 NOTE — Discharge Instructions (Signed)
Take over the counter decongestant (such as high blood pressure formulation sudafed), as directed on packaging, for the next week.  Use over the counter normal saline nasal spray, as instructed in the Emergency Department, several times per day for the next 2 weeks.  Call your regular medical doctor on Monday to schedule a follow up appointment this week.  Call your dentist on Monday to schedule a follow up appointment within the next week. Return to the Emergency Department immediately if worsening.

## 2015-11-29 ENCOUNTER — Other Ambulatory Visit (HOSPITAL_COMMUNITY): Payer: Self-pay | Admitting: *Deleted

## 2015-11-29 DIAGNOSIS — N6009 Solitary cyst of unspecified breast: Secondary | ICD-10-CM

## 2015-12-15 ENCOUNTER — Other Ambulatory Visit (HOSPITAL_COMMUNITY): Payer: Self-pay | Admitting: *Deleted

## 2015-12-15 DIAGNOSIS — N6009 Solitary cyst of unspecified breast: Secondary | ICD-10-CM

## 2015-12-20 ENCOUNTER — Encounter (HOSPITAL_COMMUNITY): Payer: Self-pay

## 2015-12-30 ENCOUNTER — Other Ambulatory Visit (HOSPITAL_COMMUNITY): Payer: Self-pay | Admitting: *Deleted

## 2015-12-30 DIAGNOSIS — N6009 Solitary cyst of unspecified breast: Secondary | ICD-10-CM

## 2016-01-03 ENCOUNTER — Inpatient Hospital Stay (HOSPITAL_COMMUNITY): Admission: RE | Admit: 2016-01-03 | Payer: Self-pay | Source: Ambulatory Visit

## 2016-01-24 ENCOUNTER — Ambulatory Visit (HOSPITAL_COMMUNITY)
Admission: RE | Admit: 2016-01-24 | Discharge: 2016-01-24 | Disposition: A | Discharge status: Home / Self Care | Payer: PRIVATE HEALTH INSURANCE | Source: Ambulatory Visit | Attending: *Deleted | Admitting: *Deleted

## 2016-01-24 DIAGNOSIS — N6009 Solitary cyst of unspecified breast: Secondary | ICD-10-CM

## 2016-01-24 DIAGNOSIS — N6001 Solitary cyst of right breast: Secondary | ICD-10-CM | POA: Insufficient documentation

## 2016-04-15 ENCOUNTER — Emergency Department (HOSPITAL_COMMUNITY)
Admission: EM | Admit: 2016-04-15 | Discharge: 2016-04-15 | Disposition: A | Discharge status: Home / Self Care | Payer: Self-pay | Attending: Emergency Medicine | Admitting: Emergency Medicine

## 2016-04-15 ENCOUNTER — Encounter (HOSPITAL_COMMUNITY): Payer: Self-pay | Admitting: *Deleted

## 2016-04-15 ENCOUNTER — Emergency Department (HOSPITAL_COMMUNITY): Payer: Self-pay

## 2016-04-15 DIAGNOSIS — D649 Anemia, unspecified: Secondary | ICD-10-CM | POA: Insufficient documentation

## 2016-04-15 DIAGNOSIS — Z79899 Other long term (current) drug therapy: Secondary | ICD-10-CM | POA: Insufficient documentation

## 2016-04-15 DIAGNOSIS — I10 Essential (primary) hypertension: Secondary | ICD-10-CM | POA: Insufficient documentation

## 2016-04-15 DIAGNOSIS — F17210 Nicotine dependence, cigarettes, uncomplicated: Secondary | ICD-10-CM | POA: Insufficient documentation

## 2016-04-15 DIAGNOSIS — R059 Cough, unspecified: Secondary | ICD-10-CM

## 2016-04-15 DIAGNOSIS — R05 Cough: Secondary | ICD-10-CM

## 2016-04-15 LAB — COMPREHENSIVE METABOLIC PANEL
ALK PHOS: 75 U/L (ref 38–126)
ALT: 17 U/L (ref 14–54)
ANION GAP: 8 (ref 5–15)
AST: 24 U/L (ref 15–41)
Albumin: 3.3 g/dL — ABNORMAL LOW (ref 3.5–5.0)
BILIRUBIN TOTAL: 0.5 mg/dL (ref 0.3–1.2)
BUN: 10 mg/dL (ref 6–20)
CALCIUM: 8.7 mg/dL — AB (ref 8.9–10.3)
CO2: 23 mmol/L (ref 22–32)
CREATININE: 0.73 mg/dL (ref 0.44–1.00)
Chloride: 112 mmol/L — ABNORMAL HIGH (ref 101–111)
GFR calc non Af Amer: >60 mL/min (ref >60)
Glucose, Bld: 111 mg/dL — ABNORMAL HIGH (ref 65–99)
Potassium: 3.5 mmol/L (ref 3.5–5.1)
Sodium: 143 mmol/L (ref 135–145)
TOTAL PROTEIN: 7.5 g/dL (ref 6.5–8.1)

## 2016-04-15 LAB — CBC WITH DIFFERENTIAL/PLATELET
BASOS ABS: 0 10*3/uL (ref 0.0–0.1)
BASOS PCT: 0 %
EOS ABS: 0 10*3/uL (ref 0.0–0.7)
Eosinophils Relative: 0 %
HEMATOCRIT: 30 % — AB (ref 36.0–46.0)
HEMOGLOBIN: 9.5 g/dL — AB (ref 12.0–15.0)
Lymphocytes Relative: 37 %
Lymphs Abs: 3.3 10*3/uL (ref 0.7–4.0)
MCH: 26.6 pg (ref 26.0–34.0)
MCHC: 31.7 g/dL (ref 30.0–36.0)
MCV: 84 fL (ref 78.0–100.0)
Monocytes Absolute: 0.8 10*3/uL (ref 0.1–1.0)
Monocytes Relative: 9 %
NEUTROS ABS: 4.8 10*3/uL (ref 1.7–7.7)
NEUTROS PCT: 54 %
Platelets: 368 10*3/uL (ref 150–400)
RBC: 3.57 MIL/uL — ABNORMAL LOW (ref 3.87–5.11)
RDW: 14.4 % (ref 11.5–15.5)
WBC: 8.9 10*3/uL (ref 4.0–10.5)

## 2016-04-15 MED ORDER — ALBUTEROL SULFATE HFA 108 (90 BASE) MCG/ACT IN AERS
2.0000 | INHALATION_SPRAY | RESPIRATORY_TRACT | Status: DC | PRN
  Administered 2016-04-15: 2 via RESPIRATORY_TRACT
  Filled 2016-04-15: qty 6.7

## 2016-04-15 MED ORDER — PREDNISONE 50 MG PO TABS
60.0000 mg | ORAL_TABLET | Freq: Once | ORAL | Status: AC
  Administered 2016-04-15: 60 mg via ORAL
  Filled 2016-04-15: qty 1

## 2016-04-15 MED ORDER — PREDNISONE 20 MG PO TABS: 60.0000 mg | ORAL_TABLET | Freq: Every day | ORAL | 0 refills | Status: DC

## 2016-04-15 MED ORDER — IPRATROPIUM-ALBUTEROL 0.5-2.5 (3) MG/3ML IN SOLN
3.0000 mL | Freq: Once | RESPIRATORY_TRACT | Status: AC
  Administered 2016-04-15: 3 mL via RESPIRATORY_TRACT
  Filled 2016-04-15: qty 3

## 2016-04-15 NOTE — ED Provider Notes (Signed)
[facility] DEPT Provider Note   CSN: [id number] Arrival date & time: 04/15/16  0239     History   Chief Complaint Chief Complaint  Patient presents with  . Cough    HPI Heather Hinton is a 56 y.o. female.  She has had a cough for the last 2 months. Cough is productive of white sputum. It is worse at night. Nothing makes it better. She has not tried any medication to treat it. Her last 2 weeks, she has had subjective fever with associated chills and sweats. She denies arthralgias or myalgias. She denies nausea, vomiting, diarrhea. She denies any sick contacts. She is sick with smoker admitting to approximately one third pack cigarettes a day. She thinks she has had some weight loss but it's not think she has had any change in her voice. She admits to early satiety.   The history is provided by the patient.    Past Medical History:  Diagnosis Date  . Hypertension     There are no active problems to display for this patient.   Past Surgical History:  Procedure Laterality Date  . TUBAL LIGATION      OB History    Gravida Para Term Preterm AB Living   9 8           SAB TAB Ectopic Multiple Live Births                   Home Medications    Prior to Admission medications   Medication Sig Start Date End Date Taking? Authorizing Provider  acetaminophen (TYLENOL) 500 MG tablet Take 1,000 mg by mouth daily as needed for mild pain or moderate pain.   Yes Historical Provider, MD  amLODipine (NORVASC) 10 MG tablet Take 10 mg by mouth daily.   Yes Historical Provider, MD  fluticasone (FLONASE) 50 MCG/ACT nasal spray Place 2 sprays into both nostrils daily. 10/01/15 10/15/15  Francine Graven, DO  ibuprofen (ADVIL,MOTRIN) 800 MG tablet Take 1 tablet (800 mg total) by mouth 3 (three) times daily. Take with food 07/03/13   Tammy Triplett, PA-C  naproxen (NAPROSYN) 250 MG tablet Take 1 tablet (250 mg total) by mouth 2 (two) times daily as needed for mild pain or moderate pain (take  with food). 10/01/15   Francine Graven, DO  penicillin v potassium (VEETID) 250 MG tablet Take 1 tablet (250 mg total) by mouth 4 (four) times daily. 10/01/15   Francine Graven, DO  tobramycin (TOBREX) 0.3 % ophthalmic solution Place 2 drops into the left eye every 4 (four) hours. 10/21/14   Fransico Meadow, PA-C  traMADol (ULTRAM) 50 MG tablet Take 1 tablet (50 mg total) by mouth every 6 (six) hours as needed. 07/03/13   Tammy Triplett, PA-C    Family History No family history on file.  Social History Social History  Substance Use Topics  . Smoking status: Current Every Day Smoker    Packs/day: 0.50    Years: 5.00    Types: Cigarettes  . Smokeless tobacco: Never Used  . Alcohol use No     Allergies   Iron   Review of Systems Review of Systems  All other systems reviewed and are negative.    Physical Exam Updated Vital Signs BP 156/71   Pulse 104   Temp 98.7 F (37.1 C) (Oral)   Resp 20   Ht '5\' 4"'$  (1.626 m)   Wt 172 lb (78 kg)   SpO2 100%   BMI 29.52 kg/m  Physical Exam  Nursing note and vitals reviewed.  56 year old female, resting comfortably and in no acute distress. Vital signs are significant for hypertension and tachycardia. Oxygen saturation is 100%, which is normal. Head is normocephalic and atraumatic. PERRLA, EOMI. Oropharynx is clear. Neck is nontender and supple without adenopathy or JVD. Back is nontender and there is no CVA tenderness. Lungs are clear without rales, wheezes, or rhonchi. Chest is nontender. Heart has regular rate and rhythm without murmur. Abdomen is soft, flat, nontender without masses or hepatosplenomegaly and peristalsis is normoactive. Extremities have no cyanosis or edema, full range of motion is present. Skin is warm and dry without rash. Neurologic: Mental status is normal, cranial nerves are intact, there are no motor or sensory deficits.  ED Treatments / Results  Labs (all labs ordered are listed, but only abnormal  results are displayed) Labs Reviewed  COMPREHENSIVE METABOLIC PANEL - Abnormal; Notable for the following:       Result Value   Chloride 112 (*)    Glucose, Bld 111 (*)    Calcium 8.7 (*)    Albumin 3.3 (*)    All other components within normal limits  CBC WITH DIFFERENTIAL/PLATELET - Abnormal; Notable for the following:    RBC 3.57 (*)    Hemoglobin 9.5 (*)    HCT 30.0 (*)    All other components within normal limits    Radiology Dg Chest 2 View  Result Date: 04/15/2016 CLINICAL DATA:  Cough with sputum production EXAM: CHEST  2 VIEW COMPARISON:  None. FINDINGS: The heart size and mediastinal contours are within normal limits. Both lungs are clear. The visualized skeletal structures are unremarkable. IMPRESSION: No active cardiopulmonary disease. Electronically Signed   By: Ulyses Jarred M.D.   On: 04/15/2016 03:15    Procedures Procedures (including critical care time)  Medications Ordered in ED Medications  predniSONE (DELTASONE) tablet 60 mg (not administered)  albuterol (PROVENTIL HFA;VENTOLIN HFA) 108 (90 Base) MCG/ACT inhaler 2 puff (not administered)  ipratropium-albuterol (DUONEB) 0.5-2.5 (3) MG/3ML nebulizer solution 3 mL (3 mLs Nebulization Given 04/15/16 0359)     Initial Impression / Assessment and Plan / ED Course  I have reviewed the triage vital signs and the nursing notes.  Pertinent labs & imaging results that were available during my care of the patient were reviewed by me and considered in my medical decision making (see chart for details).  Cough with no findings on physical exam. Reported subjective fevers but afebrile here. Chest x-ray shows no evidence of pneumonia. No worrisome shadows to suggest malignancy. Old records are reviewed, and she has no relevant past visits. Given the duration of symptoms and possible weight loss, will check screening labs. She will be given a therapeutic trial of albuterol with ipratropium.  She feels significantly better  after albuterol with ipratropium. She is given albuterol inhaler to take home and given prescription for prednisone. Laboratory workup shows moderate anemia. No old labs available for comparison. Patient is advised of this finding. Advise follow-up with PCP in one week. Urged to stop smoking.  Final Clinical Impressions(s) / ED Diagnoses   Final diagnoses:  Cough  Normochromic normocytic anemia    New Prescriptions New Prescriptions   PREDNISONE (DELTASONE) 20 MG TABLET    Take 3 tablets (60 mg total) by mouth daily. 2 tabs po daily x 4 days     Delora Fuel, MD 26/83/41 [id number]

## 2016-04-15 NOTE — ED Notes (Signed)
Pt alert & oriented x4, stable gait. Patient given discharge instructions, paperwork & prescription(s). Patient  instructed to stop at the registration desk to finish any additional paperwork. Patient verbalized understanding. Pt left department w/ no further questions. 

## 2016-04-15 NOTE — Discharge Instructions (Signed)
Use the inhaler - two puffs, every four hours as needed to control the cough  Stop smoking.

## 2016-04-15 NOTE — ED Triage Notes (Signed)
Pt c/o cough with white sputum production that started a few weeks ago that has also been associated with chills for the past few days,

## 2016-05-03 ENCOUNTER — Emergency Department (HOSPITAL_COMMUNITY): Payer: Self-pay

## 2016-05-03 ENCOUNTER — Emergency Department (HOSPITAL_COMMUNITY)
Admission: EM | Admit: 2016-05-03 | Discharge: 2016-05-03 | Disposition: A | Discharge status: Home / Self Care | Payer: Self-pay | Attending: Emergency Medicine | Admitting: Emergency Medicine

## 2016-05-03 ENCOUNTER — Encounter (HOSPITAL_COMMUNITY): Payer: Self-pay | Admitting: Emergency Medicine

## 2016-05-03 DIAGNOSIS — F17210 Nicotine dependence, cigarettes, uncomplicated: Secondary | ICD-10-CM | POA: Insufficient documentation

## 2016-05-03 DIAGNOSIS — S76212A Strain of adductor muscle, fascia and tendon of left thigh, initial encounter: Secondary | ICD-10-CM | POA: Insufficient documentation

## 2016-05-03 DIAGNOSIS — Z79899 Other long term (current) drug therapy: Secondary | ICD-10-CM | POA: Insufficient documentation

## 2016-05-03 DIAGNOSIS — I10 Essential (primary) hypertension: Secondary | ICD-10-CM | POA: Insufficient documentation

## 2016-05-03 DIAGNOSIS — Y929 Unspecified place or not applicable: Secondary | ICD-10-CM | POA: Insufficient documentation

## 2016-05-03 DIAGNOSIS — Y939 Activity, unspecified: Secondary | ICD-10-CM | POA: Insufficient documentation

## 2016-05-03 DIAGNOSIS — Y999 Unspecified external cause status: Secondary | ICD-10-CM | POA: Insufficient documentation

## 2016-05-03 DIAGNOSIS — X58XXXA Exposure to other specified factors, initial encounter: Secondary | ICD-10-CM | POA: Insufficient documentation

## 2016-05-03 MED ORDER — IBUPROFEN 600 MG PO TABS: 600.0000 mg | ORAL_TABLET | Freq: Four times a day (QID) | ORAL | 0 refills | Status: DC

## 2016-05-03 MED ORDER — CYCLOBENZAPRINE HCL 10 MG PO TABS: 10.0000 mg | ORAL_TABLET | Freq: Three times a day (TID) | ORAL | 0 refills | Status: DC

## 2016-05-03 NOTE — Discharge Instructions (Signed)
Your x-ray is negative for fracture, or dislocation of the hip or the pelvis area. I suspect you have a strain in the groin muscle area. Please use a heating pad to the area. Please rest your groin muscles is much as possible. Use Flexeril 3 times daily, use ibuprofen with breakfast, lunch, dinner, and at bedtime. Please see Dr. Aline Brochure for orthopedic evaluation if not improving.

## 2016-05-03 NOTE — ED Triage Notes (Signed)
Patient c/o left hip pain that radiates into left leg x3 weeks. Denies any injury or swelling. Per patient nothing relieves pain. Pedal pulse present.

## 2016-05-03 NOTE — ED Provider Notes (Signed)
[facility] DEPT Provider Note   CSN: [id number] Arrival date & time: 05/03/16  1421     History   Chief Complaint Chief Complaint  Patient presents with  . Leg Pain    HPI Heather Hinton is a 56 y.o. female.  Patient is a 56 year old female who presents to the emergency department with a complaint of left hip pain.  The patient states that over the last 3 weeks she's been having pain in her left hip and thigh area. She has not had any recent injury or trauma. She's not had any recent procedures. The patient reports that she has started a new job. And the pain that was previously in her thigh area has gotten worse. Rest helps the pain, but does not resolve it. Changing positions and certain movements make the pain worse. She denies any fever or chills related to this pain. She denies any vaginal issues, and his been no urine issues reported.      Past Medical History:  Diagnosis Date  . Hypertension     There are no active problems to display for this patient.   Past Surgical History:  Procedure Laterality Date  . TUBAL LIGATION      OB History    Gravida Para Term Preterm AB Living   9 8           SAB TAB Ectopic Multiple Live Births                   Home Medications    Prior to Admission medications   Medication Sig Start Date End Date Taking? Authorizing Provider  acetaminophen (TYLENOL) 500 MG tablet Take 1,000 mg by mouth daily as needed for mild pain or moderate pain.    Historical Provider, MD  amLODipine (NORVASC) 10 MG tablet Take 10 mg by mouth daily.    Historical Provider, MD  fluticasone (FLONASE) 50 MCG/ACT nasal spray Place 2 sprays into both nostrils daily. 10/01/15 10/15/15  Francine Graven, DO  ibuprofen (ADVIL,MOTRIN) 800 MG tablet Take 1 tablet (800 mg total) by mouth 3 (three) times daily. Take with food 07/03/13   Tammy Triplett, PA-C  naproxen (NAPROSYN) 250 MG tablet Take 1 tablet (250 mg total) by mouth 2 (two) times daily as needed  for mild pain or moderate pain (take with food). 10/01/15   Francine Graven, DO  penicillin v potassium (VEETID) 250 MG tablet Take 1 tablet (250 mg total) by mouth 4 (four) times daily. 10/01/15   Francine Graven, DO  predniSONE (DELTASONE) 20 MG tablet Take 3 tablets (60 mg total) by mouth daily. 2 tabs po daily x 4 days 05/16/30   Delora Fuel, MD  tobramycin (TOBREX) 0.3 % ophthalmic solution Place 2 drops into the left eye every 4 (four) hours. 10/21/14   Fransico Meadow, PA-C  traMADol (ULTRAM) 50 MG tablet Take 1 tablet (50 mg total) by mouth every 6 (six) hours as needed. 07/03/13   Tammy Triplett, PA-C    Family History No family history on file.  Social History Social History  Substance Use Topics  . Smoking status: Current Every Day Smoker    Packs/day: 0.50    Years: 5.00    Types: Cigarettes  . Smokeless tobacco: Never Used  . Alcohol use No     Allergies   Iron   Review of Systems Review of Systems  Musculoskeletal: Positive for arthralgias.  All other systems reviewed and are negative.    Physical Exam Updated Vital Signs  BP (!) 155/68 (BP Location: Left Arm)   Pulse 96   Temp 97.9 F (36.6 C) (Oral)   Resp 18   Ht '5\' 4"'$  (1.626 m)   Wt 78 kg   SpO2 99%   BMI 29.52 kg/m   Physical Exam  Constitutional: She is oriented to person, place, and time. She appears well-developed and well-nourished.  Non-toxic appearance.  HENT:  Head: Normocephalic.  Right Ear: Tympanic membrane and external ear normal.  Left Ear: Tympanic membrane and external ear normal.  Eyes: EOM and lids are normal. Pupils are equal, round, and reactive to light.  Neck: Normal range of motion. Neck supple. Carotid bruit is not present.  Cardiovascular: Normal rate, regular rhythm, normal heart sounds, intact distal pulses and normal pulses.   Pulmonary/Chest: Breath sounds normal. No respiratory distress.  Abdominal: Soft. Bowel sounds are normal. There is no tenderness. There is no  guarding.  Musculoskeletal: Normal range of motion.  There is no palpable step off of the cervical, thoracic, or lumbar spine. There is pain of the left hip with attempted range of motion. Pain is worse with standing. Is no evidence of any dislocation. There is no shortening of the lower extremities. There no hot areas are related to the left hip and thigh area.  Lymphadenopathy:       Head (right side): No submandibular adenopathy present.       Head (left side): No submandibular adenopathy present.    She has no cervical adenopathy.  Neurological: She is alert and oriented to person, place, and time. She has normal strength. No cranial nerve deficit or sensory deficit.  There no motor or sensory deficits of the upper or lower extremities.  Skin: Skin is warm and dry.  Psychiatric: She has a normal mood and affect. Her speech is normal.  Nursing note and vitals reviewed.    ED Treatments / Results  Labs (all labs ordered are listed, but only abnormal results are displayed) Labs Reviewed - No data to display  EKG  EKG Interpretation None       Radiology No results found.  Procedures Procedures (including critical care time)  Medications Ordered in ED Medications - No data to display   Initial Impression / Assessment and Plan / ED Course  I have reviewed the triage vital signs and the nursing notes.  Pertinent labs & imaging results that were available during my care of the patient were reviewed by me and considered in my medical decision making (see chart for details).     *I have reviewed nursing notes, vital signs, and all appropriate lab and imaging results for this patient.**  Final Clinical Impressions(s) / ED Diagnoses MDM Vital signs reviewed. Patient has pain in the thigh and hip area with attempting to walk and with change of positions. X-ray of the hip and pelvis has been ordered.  X-ray of the left hip and pelvis is negative for fracture or dislocation.  The examination suggests groin muscle strain. Patient will be treated with heating pad, Flexeril, ibuprofen. I've asked the patient to use crutches to keep the weight off of her groin area. The patient will follow-up with Dr. Aline Brochure for orthopedic evaluation if not improving.    Final diagnoses:  Groin strain, left, initial encounter    New Prescriptions New Prescriptions   No medications on file     Lily Kocher, PA-C 05/03/16 [place], MD 05/04/16 [phone]

## 2016-05-24 ENCOUNTER — Encounter: Payer: Self-pay | Admitting: Orthopaedic Surgery

## 2016-05-24 ENCOUNTER — Ambulatory Visit (INDEPENDENT_AMBULATORY_CARE_PROVIDER_SITE_OTHER): Payer: Self-pay | Admitting: Orthopaedic Surgery

## 2016-05-24 VITALS — BP 135/89 | HR 106 | Temp 97.5°F | Ht 67.5 in | Wt 148.0 lb

## 2016-05-24 DIAGNOSIS — M25552 Pain in left hip: Secondary | ICD-10-CM

## 2016-05-24 MED ORDER — PREDNISONE 5 MG (21) PO TBPK: ORAL_TABLET | ORAL | 0 refills | Status: DC

## 2016-05-24 NOTE — Progress Notes (Signed)
Subjective:    Patient ID: Heather Hinton, female    DOB: [birth year], 56 y.o.   MRN: [id number]  HPI She has had pain with motion of the left hip over the last four to five months getting worse.  She has pain with weight bearing and with external motion of the hip.  She has tried Advil, Tylenol, heat, ice and rest with no help.  She has been to the ER several times for this.  She has had x-rays of the left hip which are negative.  She has no trauma, no redness, no swelling, no paresthesias.  She is tired of hurting more and more.   Review of Systems  HENT: Negative for congestion.   Respiratory: Negative for cough and shortness of breath.   Cardiovascular: Negative for chest pain and leg swelling.  Endocrine: Negative for cold intolerance.  Musculoskeletal: Positive for arthralgias and gait problem.  Allergic/Immunologic: Negative for environmental allergies.   Past Medical History:  Diagnosis Date  . Hypertension     Past Surgical History:  Procedure Laterality Date  . TUBAL LIGATION      Current Outpatient Prescriptions on File Prior to Visit  Medication Sig Dispense Refill  . cyclobenzaprine (FLEXERIL) 10 MG tablet Take 1 tablet (10 mg total) by mouth 3 (three) times daily. 20 tablet 0  . ibuprofen (ADVIL,MOTRIN) 600 MG tablet Take 1 tablet (600 mg total) by mouth 4 (four) times daily. 30 tablet 0   No current facility-administered medications on file prior to visit.     Social History   Social History  . Marital status: Legally Separated    Spouse name: N/A  . Number of children: N/A  . Years of education: N/A   Occupational History  . Not on file.   Social History Main Topics  . Smoking status: Current Every Day Smoker    Packs/day: 0.50    Years: 5.00    Types: Cigarettes  . Smokeless tobacco: Never Used  . Alcohol use No  . Drug use: No  . Sexual activity: Yes   Other Topics Concern  . Not on file   Social History Narrative  . No narrative on file     Family History  Problem Relation Age of Onset  . Cancer Mother   . Hypertension Mother   . Diabetes Mother   . Hypertension Father   . Cancer Sister   . Hypertension Sister     BP 135/89   Pulse (!) 106   Temp 97.5 F (36.4 C)   Ht 5' 7.5" (1.715 m)   Wt 148 lb (67.1 kg)   BMI 22.84 kg/m      Objective:   Physical Exam  Constitutional: She is oriented to person, place, and time. She appears well-developed and well-nourished.  HENT:  Head: Normocephalic and atraumatic.  Eyes: Conjunctivae and EOM are normal. Pupils are equal, round, and reactive to light.  Neck: Normal range of motion. Neck supple.  Cardiovascular: Normal rate, regular rhythm and intact distal pulses.   Pulmonary/Chest: Effort normal.  Abdominal: Soft.  Musculoskeletal: She exhibits tenderness (Pain of left hip with motion, external only 10 degrees, internal 20, flexion 90, abduction and adduction painful, extension 5, limp to the left, no redness.  NV intact.  Right hip negative.  Back negative.).  Neurological: She is alert and oriented to person, place, and time. She displays normal reflexes. No cranial nerve deficit. She exhibits normal muscle tone. Coordination normal.  Skin: Skin is warm and  dry.  Psychiatric: She has a normal mood and affect. Her behavior is normal. Judgment and thought content normal.  Vitals reviewed.         Assessment & Plan:   Encounter Diagnosis  Name Primary?  . Left hip pain Yes   I will give prednisone dose pack.  I have told her about [facility] and she will see if she qualifies.  I believe she needs MRI of the hip to rule out avascular necrosis.  Return in two weeks.  Call if any problem.  Precautions given.  Electronically Signed Sanjuana Kava, MD 4/12/20189:30 AM

## 2016-05-24 NOTE — Patient Instructions (Signed)
Steps to Quit Smoking Smoking tobacco can be bad for your health. It can also affect almost every organ in your body. Smoking puts you and people around you at risk for many serious long-lasting (chronic) diseases. Quitting smoking is hard, but it is one of the best things that you can do for your health. It is never too late to quit. What are the benefits of quitting smoking? When you quit smoking, you lower your risk for getting serious diseases and conditions. They can include:  Lung cancer or lung disease.  Heart disease.  Stroke.  Heart attack.  Not being able to have children (infertility).  Weak bones (osteoporosis) and broken bones (fractures). If you have coughing, wheezing, and shortness of breath, those symptoms may get better when you quit. You may also get sick less often. If you are pregnant, quitting smoking can help to lower your chances of having a baby of low birth weight. What can I do to help me quit smoking? Talk with your doctor about what can help you quit smoking. Some things you can do (strategies) include:  Quitting smoking totally, instead of slowly cutting back how much you smoke over a period of time.  Going to in-person counseling. You are more likely to quit if you go to many counseling sessions.  Using resources and support systems, such as:  Online chats with a counselor.  Phone quitlines.  Printed self-help materials.  Support groups or group counseling.  Text messaging programs.  Mobile phone apps or applications.  Taking medicines. Some of these medicines may have nicotine in them. If you are pregnant or breastfeeding, do not take any medicines to quit smoking unless your doctor says it is okay. Talk with your doctor about counseling or other things that can help you. Talk with your doctor about using more than one strategy at the same time, such as taking medicines while you are also going to in-person counseling. This can help make quitting  easier. What things can I do to make it easier to quit? Quitting smoking might feel very hard at first, but there is a lot that you can do to make it easier. Take these steps:  Talk to your family and friends. Ask them to support and encourage you.  Call phone quitlines, reach out to support groups, or work with a counselor.  Ask people who smoke to not smoke around you.  Avoid places that make you want (trigger) to smoke, such as:  Bars.  Parties.  Smoke-break areas at work.  Spend time with people who do not smoke.  Lower the stress in your life. Stress can make you want to smoke. Try these things to help your stress:  Getting regular exercise.  Deep-breathing exercises.  Yoga.  Meditating.  Doing a body scan. To do this, close your eyes, focus on one area of your body at a time from head to toe, and notice which parts of your body are tense. Try to relax the muscles in those areas.  Download or buy apps on your mobile phone or tablet that can help you stick to your quit plan. There are many free apps, such as QuitGuide from the CDC (Centers for Disease Control and Prevention). You can find more support from smokefree.gov and other websites. This information is not intended to replace advice given to you by your health care provider. Make sure you discuss any questions you have with your health care provider. Document Released: 11/25/2008 Document Revised: 09/27/2015 Document   Reviewed: 06/15/2014 Elsevier Interactive Patient Education  2017 [employer]  

## 2016-06-05 ENCOUNTER — Encounter (HOSPITAL_COMMUNITY): Payer: Self-pay | Admitting: *Deleted

## 2016-06-05 ENCOUNTER — Emergency Department (HOSPITAL_COMMUNITY)
Admission: EM | Admit: 2016-06-05 | Discharge: 2016-06-05 | Disposition: A | Discharge status: Home / Self Care | Payer: Self-pay | Attending: Emergency Medicine | Admitting: Emergency Medicine

## 2016-06-05 DIAGNOSIS — G8929 Other chronic pain: Secondary | ICD-10-CM | POA: Insufficient documentation

## 2016-06-05 DIAGNOSIS — I10 Essential (primary) hypertension: Secondary | ICD-10-CM | POA: Insufficient documentation

## 2016-06-05 DIAGNOSIS — M79605 Pain in left leg: Secondary | ICD-10-CM | POA: Insufficient documentation

## 2016-06-05 DIAGNOSIS — Z79899 Other long term (current) drug therapy: Secondary | ICD-10-CM | POA: Insufficient documentation

## 2016-06-05 DIAGNOSIS — F17210 Nicotine dependence, cigarettes, uncomplicated: Secondary | ICD-10-CM | POA: Insufficient documentation

## 2016-06-05 MED ORDER — TRAMADOL HCL 50 MG PO TABS: 50.0000 mg | ORAL_TABLET | Freq: Four times a day (QID) | ORAL | 0 refills | Status: DC | PRN

## 2016-06-05 MED ORDER — METHOCARBAMOL 500 MG PO TABS: 500.0000 mg | ORAL_TABLET | Freq: Two times a day (BID) | ORAL | 0 refills | Status: DC

## 2016-06-05 MED ORDER — IBUPROFEN 800 MG PO TABS: 800.0000 mg | ORAL_TABLET | Freq: Three times a day (TID) | ORAL | 0 refills | Status: DC | PRN

## 2016-06-05 NOTE — Discharge Instructions (Signed)

## 2016-06-05 NOTE — ED Triage Notes (Addendum)
Pt c/o pain that starts in the left buttocks and goes down into pt's left thigh x 2-3 months. Pt reports the pain is constant. Denies injury.

## 2016-06-05 NOTE — ED Provider Notes (Signed)
Emergency Department Provider Note   I have reviewed the triage vital signs and the nursing notes.  HISTORY  Chief Complaint Leg Pain   HPI Heather Hinton is a 56 y.o. female with a history of hypertension who presents to the ED with complaints of chronic left leg pain. She describes this pain as a knot near her posterior left buttocks, with radiation down her down leg, ending above the knee joint. She has associated left muscular atrophy. Per chart review, pt was seen on 05/24/2016 with a negative hip x-ray, and was discharged with prednisone, however, her sx have persisted. It was proposed at that time that she may need an MRI to rule out avascular necrosis. Pt denies experiencing any back pain, swelling, redness, bruising, dysuria, abdominal pain, vaginal bleeding, vaginal discharge, numbness or tingling. She reports no recent injury, but during discussion notes a broken tail bone >4 months ago that has since healed. Pt has no PCP and made an outpatient orthopedic appointment. This was scheduled for next month and in the setting of her sx, the pt felt that she could not wait until then.  Past Medical History:  Diagnosis Date  . Hypertension     There are no active problems to display for this patient.   Past Surgical History:  Procedure Laterality Date  . TUBAL LIGATION      Current Outpatient Rx  . Order #: [id number] Class: Historical Med  . Order #: [id number] Class: Print  . Order #: [id number] Class: Print  . Order #: [id number] Class: Normal  . Order #: [id number] Class: Print    Allergies Iron  Family History  Problem Relation Age of Onset  . Cancer Mother   . Hypertension Mother   . Diabetes Mother   . Hypertension Father   . Cancer Sister   . Hypertension Sister     Social History Social History  Substance Use Topics  . Smoking status: Current Every Day Smoker    Packs/day: 0.50    Years: 5.00    Types: Cigarettes  . Smokeless tobacco: Never Used  .  Alcohol use No    Review of Systems Constitutional: No fever/chills Eyes: No visual changes. ENT: No sore throat. Cardiovascular: Denies chest pain. Respiratory: Denies shortness of breath. Gastrointestinal: No abdominal pain.  No nausea, no vomiting.  No diarrhea.  No constipation. Genitourinary: Negative for dysuria. Musculoskeletal: Negative for back pain. Positive for left leg pain and muscular atrophy.  Skin: Negative for rash. Neurological: Negative for headaches, focal weakness or numbness.  10-point ROS otherwise negative.  ____________________________________________   PHYSICAL EXAM:  VITAL SIGNS: ED Triage Vitals  Enc Vitals Group     BP 06/05/16 0859 (!) 141/68     Pulse Rate 06/05/16 0859 100     Resp 06/05/16 0859 16     Temp 06/05/16 0859 98.5 F (36.9 C)     Temp Source 06/05/16 0859 Oral     SpO2 06/05/16 0859 100 %     Weight 06/05/16 0858 148 lb (67.1 kg)     Height 06/05/16 0858 5' 7.5" (1.715 m)     Pain Score 06/05/16 0857 2   Constitutional: Alert and oriented. Well appearing and in no acute distress. Eyes: Conjunctivae are normal. Head: Atraumatic. Nose: No congestion/rhinnorhea. Mouth/Throat: Mucous membranes are moist.  Neck: No stridor.  Cardiovascular: Normal rate, regular rhythm. Good peripheral circulation. Grossly normal heart sounds.   Respiratory: Normal respiratory effort.  No retractions. Lungs CTAB. Gastrointestinal: Soft and nontender. No distention.  Musculoskeletal:  No lower extremity tenderness nor edema. No gross deformities of extremities. Slightly limited ROM of the left hip. No midline spine tenderness.  Neurologic:  Normal speech and language. No gross focal neurologic deficits are appreciated.  Skin:  Skin is warm, dry and intact. No rash noted.  ____________________________________________   PROCEDURES  Procedure(s) performed:   Procedures  None ____________________________________________   INITIAL IMPRESSION  / ASSESSMENT AND PLAN / ED COURSE  Pertinent labs & imaging results that were available during my care of the patient were reviewed by me and considered in my medical decision making (see chart for details).  Patient presents to the ED with chronic left hip pain. No midline spine tenderness. Normal reflexes. No evidence to suggest spinal cord injury/emergency. No evidence of DVT. No evidence to suggest septic hip. Patient has seen orthopedics with plan to obtain second opinion next week. Plain film of the hip performed during last ED presentation. No indication for emergent CT or MRI at this time.   At this time, I do not feel there is any life-threatening condition present. I have reviewed and discussed all results (EKG, imaging, lab, urine as appropriate), exam findings with patient. I have reviewed nursing notes and appropriate previous records.  I feel the patient is safe to be discharged home without further emergent workup. Discussed usual and customary return precautions. Patient and family (if present) verbalize understanding and are comfortable with this plan.  Patient will follow-up with their primary care provider. If they do not have a primary care provider, information for follow-up has been provided to them. All questions have been answered.    ____________________________________________  FINAL CLINICAL IMPRESSION(S) / ED DIAGNOSES  Final diagnoses:  Left leg pain     MEDICATIONS GIVEN DURING THIS VISIT:  None  NEW OUTPATIENT MEDICATIONS STARTED DURING THIS VISIT:  Discharge Medication List as of 06/05/2016  9:19 AM    START taking these medications   Details  methocarbamol (ROBAXIN) 500 MG tablet Take 1 tablet (500 mg total) by mouth 2 (two) times daily., Starting Tue 06/05/2016, Print    traMADol (ULTRAM) 50 MG tablet Take 1 tablet (50 mg total) by mouth every 6 (six) hours as needed., Starting Tue 06/05/2016, Print         Note:  This document was prepared using  Dragon voice recognition software and may include unintentional dictation errors.  Nanda Quinton, MD Emergency Medicine  I personally performed the services described in this documentation, which was scribed in my presence. The recorded information has been reviewed and is accurate.   By signing my name below, I, Levester Fresh, attest that this documentation has been prepared under the direction and in the presence of Margette Fast, MD . Electronically Signed: Levester Fresh, Scribe. 06/05/2016. 9:29 AM.   Margette Fast, MD 06/05/16 [phone]

## 2016-06-07 ENCOUNTER — Ambulatory Visit: Payer: Self-pay | Admitting: Family Medicine

## 2016-06-11 ENCOUNTER — Other Ambulatory Visit (HOSPITAL_COMMUNITY): Payer: Self-pay | Admitting: Preventative Medicine

## 2016-06-11 ENCOUNTER — Other Ambulatory Visit: Payer: Self-pay | Admitting: Preventative Medicine

## 2016-06-11 DIAGNOSIS — M5416 Radiculopathy, lumbar region: Secondary | ICD-10-CM

## 2016-06-13 ENCOUNTER — Encounter: Payer: Self-pay | Admitting: Adult Health

## 2016-06-16 ENCOUNTER — Ambulatory Visit (HOSPITAL_COMMUNITY)
Admission: RE | Admit: 2016-06-16 | Discharge: 2016-06-16 | Disposition: A | Discharge status: Home / Self Care | Payer: Medicaid Other | Source: Ambulatory Visit | Attending: Preventative Medicine | Admitting: Preventative Medicine

## 2016-06-16 DIAGNOSIS — M5126 Other intervertebral disc displacement, lumbar region: Secondary | ICD-10-CM | POA: Insufficient documentation

## 2016-06-16 DIAGNOSIS — M4316 Spondylolisthesis, lumbar region: Secondary | ICD-10-CM | POA: Diagnosis not present

## 2016-06-16 DIAGNOSIS — M5416 Radiculopathy, lumbar region: Secondary | ICD-10-CM | POA: Insufficient documentation

## 2016-06-16 DIAGNOSIS — N281 Cyst of kidney, acquired: Secondary | ICD-10-CM | POA: Diagnosis not present

## 2016-06-16 DIAGNOSIS — M48061 Spinal stenosis, lumbar region without neurogenic claudication: Secondary | ICD-10-CM | POA: Diagnosis not present

## 2016-07-14 ENCOUNTER — Encounter (HOSPITAL_COMMUNITY): Payer: Self-pay | Admitting: Emergency Medicine

## 2016-07-14 ENCOUNTER — Inpatient Hospital Stay (HOSPITAL_COMMUNITY)
Admission: EM | Admit: 2016-07-14 | Discharge: 2016-07-25 | DRG: 477 | Disposition: A | Discharge status: Home / Self Care | Payer: Medicaid Other | Attending: Internal Medicine | Admitting: Internal Medicine

## 2016-07-14 ENCOUNTER — Emergency Department (HOSPITAL_COMMUNITY): Payer: Medicaid Other

## 2016-07-14 DIAGNOSIS — J189 Pneumonia, unspecified organism: Secondary | ICD-10-CM | POA: Diagnosis present

## 2016-07-14 DIAGNOSIS — K5903 Drug induced constipation: Secondary | ICD-10-CM | POA: Diagnosis not present

## 2016-07-14 DIAGNOSIS — D473 Essential (hemorrhagic) thrombocythemia: Secondary | ICD-10-CM | POA: Diagnosis present

## 2016-07-14 DIAGNOSIS — I10 Essential (primary) hypertension: Secondary | ICD-10-CM | POA: Diagnosis present

## 2016-07-14 DIAGNOSIS — C7801 Secondary malignant neoplasm of right lung: Secondary | ICD-10-CM | POA: Diagnosis present

## 2016-07-14 DIAGNOSIS — K5909 Other constipation: Secondary | ICD-10-CM

## 2016-07-14 DIAGNOSIS — C342 Malignant neoplasm of middle lobe, bronchus or lung: Secondary | ICD-10-CM | POA: Diagnosis present

## 2016-07-14 DIAGNOSIS — D649 Anemia, unspecified: Secondary | ICD-10-CM | POA: Diagnosis not present

## 2016-07-14 DIAGNOSIS — R64 Cachexia: Secondary | ICD-10-CM | POA: Diagnosis present

## 2016-07-14 DIAGNOSIS — C7951 Secondary malignant neoplasm of bone: Secondary | ICD-10-CM | POA: Diagnosis present

## 2016-07-14 DIAGNOSIS — G893 Neoplasm related pain (acute) (chronic): Secondary | ICD-10-CM | POA: Diagnosis present

## 2016-07-14 DIAGNOSIS — Z6823 Body mass index (BMI) 23.0-23.9, adult: Secondary | ICD-10-CM

## 2016-07-14 DIAGNOSIS — C3490 Malignant neoplasm of unspecified part of unspecified bronchus or lung: Secondary | ICD-10-CM | POA: Diagnosis not present

## 2016-07-14 DIAGNOSIS — Z87891 Personal history of nicotine dependence: Secondary | ICD-10-CM | POA: Diagnosis not present

## 2016-07-14 DIAGNOSIS — D7589 Other specified diseases of blood and blood-forming organs: Secondary | ICD-10-CM | POA: Diagnosis present

## 2016-07-14 DIAGNOSIS — D72829 Elevated white blood cell count, unspecified: Secondary | ICD-10-CM

## 2016-07-14 DIAGNOSIS — R739 Hyperglycemia, unspecified: Secondary | ICD-10-CM | POA: Diagnosis present

## 2016-07-14 DIAGNOSIS — E876 Hypokalemia: Secondary | ICD-10-CM | POA: Diagnosis not present

## 2016-07-14 DIAGNOSIS — Z7189 Other specified counseling: Secondary | ICD-10-CM | POA: Diagnosis not present

## 2016-07-14 DIAGNOSIS — E785 Hyperlipidemia, unspecified: Secondary | ICD-10-CM | POA: Diagnosis present

## 2016-07-14 DIAGNOSIS — R195 Other fecal abnormalities: Secondary | ICD-10-CM | POA: Diagnosis not present

## 2016-07-14 DIAGNOSIS — C3491 Malignant neoplasm of unspecified part of right bronchus or lung: Secondary | ICD-10-CM | POA: Diagnosis not present

## 2016-07-14 DIAGNOSIS — R509 Fever, unspecified: Secondary | ICD-10-CM

## 2016-07-14 DIAGNOSIS — Z510 Encounter for antineoplastic radiation therapy: Secondary | ICD-10-CM | POA: Diagnosis present

## 2016-07-14 DIAGNOSIS — C7931 Secondary malignant neoplasm of brain: Secondary | ICD-10-CM | POA: Diagnosis not present

## 2016-07-14 DIAGNOSIS — T451X5A Adverse effect of antineoplastic and immunosuppressive drugs, initial encounter: Secondary | ICD-10-CM | POA: Diagnosis present

## 2016-07-14 DIAGNOSIS — Z79899 Other long term (current) drug therapy: Secondary | ICD-10-CM | POA: Diagnosis not present

## 2016-07-14 DIAGNOSIS — M48061 Spinal stenosis, lumbar region without neurogenic claudication: Secondary | ICD-10-CM | POA: Diagnosis present

## 2016-07-14 DIAGNOSIS — E43 Unspecified severe protein-calorie malnutrition: Secondary | ICD-10-CM | POA: Diagnosis present

## 2016-07-14 DIAGNOSIS — Z515 Encounter for palliative care: Secondary | ICD-10-CM

## 2016-07-14 DIAGNOSIS — K5900 Constipation, unspecified: Secondary | ICD-10-CM | POA: Diagnosis not present

## 2016-07-14 DIAGNOSIS — D75839 Thrombocytosis, unspecified: Secondary | ICD-10-CM | POA: Diagnosis present

## 2016-07-14 DIAGNOSIS — D509 Iron deficiency anemia, unspecified: Secondary | ICD-10-CM

## 2016-07-14 DIAGNOSIS — C7802 Secondary malignant neoplasm of left lung: Secondary | ICD-10-CM | POA: Diagnosis not present

## 2016-07-14 DIAGNOSIS — D496 Neoplasm of unspecified behavior of brain: Secondary | ICD-10-CM

## 2016-07-14 DIAGNOSIS — D638 Anemia in other chronic diseases classified elsewhere: Secondary | ICD-10-CM | POA: Diagnosis present

## 2016-07-14 DIAGNOSIS — D61810 Antineoplastic chemotherapy induced pancytopenia: Secondary | ICD-10-CM | POA: Diagnosis present

## 2016-07-14 DIAGNOSIS — R531 Weakness: Secondary | ICD-10-CM | POA: Diagnosis present

## 2016-07-14 DIAGNOSIS — M25552 Pain in left hip: Secondary | ICD-10-CM | POA: Diagnosis not present

## 2016-07-14 DIAGNOSIS — T402X5A Adverse effect of other opioids, initial encounter: Secondary | ICD-10-CM | POA: Diagnosis not present

## 2016-07-14 HISTORY — DX: Spinal stenosis, site unspecified: M48.00

## 2016-07-14 LAB — CBC WITH DIFFERENTIAL/PLATELET
BASOS PCT: 0 %
Basophils Absolute: 0 10*3/uL (ref 0.0–0.1)
EOS ABS: 0.1 10*3/uL (ref 0.0–0.7)
EOS PCT: 0 %
HCT: 25.9 % — ABNORMAL LOW (ref 36.0–46.0)
Hemoglobin: 7.9 g/dL — ABNORMAL LOW (ref 12.0–15.0)
Lymphocytes Relative: 15 %
Lymphs Abs: 3.4 10*3/uL (ref 0.7–4.0)
MCH: 24.1 pg — ABNORMAL LOW (ref 26.0–34.0)
MCHC: 30.5 g/dL (ref 30.0–36.0)
MCV: 79 fL (ref 78.0–100.0)
MONOS PCT: 5 %
Monocytes Absolute: 1.2 10*3/uL — ABNORMAL HIGH (ref 0.1–1.0)
NEUTROS PCT: 79 %
Neutro Abs: 17.7 10*3/uL — ABNORMAL HIGH (ref 1.7–7.7)
PLATELETS: 743 10*3/uL — AB (ref 150–400)
RBC: 3.28 MIL/uL — ABNORMAL LOW (ref 3.87–5.11)
RDW: 16.1 % — AB (ref 11.5–15.5)
WBC: 22.4 10*3/uL — AB (ref 4.0–10.5)

## 2016-07-14 LAB — PREPARE RBC (CROSSMATCH)

## 2016-07-14 LAB — COMPREHENSIVE METABOLIC PANEL
ALK PHOS: 174 U/L — AB (ref 38–126)
ALT: 11 U/L — AB (ref 14–54)
AST: 14 U/L — AB (ref 15–41)
Albumin: 2.5 g/dL — ABNORMAL LOW (ref 3.5–5.0)
Anion gap: 13 (ref 5–15)
BUN: 12 mg/dL (ref 6–20)
CALCIUM: 9.1 mg/dL (ref 8.9–10.3)
CO2: 25 mmol/L (ref 22–32)
CREATININE: 0.82 mg/dL (ref 0.44–1.00)
Chloride: 104 mmol/L (ref 101–111)
Glucose, Bld: 115 mg/dL — ABNORMAL HIGH (ref 65–99)
Potassium: 2.5 mmol/L — CL (ref 3.5–5.1)
Sodium: 142 mmol/L (ref 135–145)
Total Bilirubin: 0.6 mg/dL (ref 0.3–1.2)
Total Protein: 8.1 g/dL (ref 6.5–8.1)

## 2016-07-14 LAB — URINALYSIS, ROUTINE W REFLEX MICROSCOPIC
BILIRUBIN URINE: NEGATIVE
GLUCOSE, UA: NEGATIVE mg/dL
HGB URINE DIPSTICK: NEGATIVE
KETONES UR: NEGATIVE mg/dL
Leukocytes, UA: NEGATIVE
Nitrite: NEGATIVE
PROTEIN: NEGATIVE mg/dL
Specific Gravity, Urine: 1.02 (ref 1.005–1.030)
pH: 7 (ref 5.0–8.0)

## 2016-07-14 LAB — POC OCCULT BLOOD, ED: Fecal Occult Bld: POSITIVE — AB

## 2016-07-14 LAB — MAGNESIUM: MAGNESIUM: 2.1 mg/dL (ref 1.7–2.4)

## 2016-07-14 LAB — ABO/RH: ABO/RH(D): A POS

## 2016-07-14 MED ORDER — ACETAMINOPHEN 500 MG PO TABS
1000.0000 mg | ORAL_TABLET | Freq: Once | ORAL | Status: AC
  Administered 2016-07-14: 1000 mg via ORAL
  Filled 2016-07-14: qty 2

## 2016-07-14 MED ORDER — SODIUM CHLORIDE 0.9 % IV SOLN: Freq: Once | INTRAVENOUS | Status: DC

## 2016-07-14 MED ORDER — SODIUM CHLORIDE 0.9 % IV BOLUS (SEPSIS)
1000.0000 mL | Freq: Once | INTRAVENOUS | Status: AC
  Administered 2016-07-14: 1000 mL via INTRAVENOUS

## 2016-07-14 MED ORDER — FAMOTIDINE IN NACL 20-0.9 MG/50ML-% IV SOLN
20.0000 mg | Freq: Once | INTRAVENOUS | Status: AC
Start: 2016-07-14 — End: 2016-07-14
  Administered 2016-07-14: 20 mg via INTRAVENOUS
  Filled 2016-07-14: qty 50

## 2016-07-14 MED ORDER — POTASSIUM CHLORIDE IN NACL 40-0.9 MEQ/L-% IV SOLN
INTRAVENOUS | Status: DC
  Administered 2016-07-15 – 2016-07-18 (×3): 100 mL/h via INTRAVENOUS
  Administered 2016-07-19 – 2016-07-23 (×6): 75 mL/h via INTRAVENOUS
  Filled 2016-07-14 (×13): qty 1000

## 2016-07-14 MED ORDER — ACETAMINOPHEN 325 MG PO TABS
650.0000 mg | ORAL_TABLET | Freq: Four times a day (QID) | ORAL | Status: DC | PRN
  Administered 2016-07-15 (×2): 650 mg via ORAL
  Filled 2016-07-14 (×2): qty 2

## 2016-07-14 MED ORDER — ONDANSETRON HCL 4 MG PO TABS
4.0000 mg | ORAL_TABLET | Freq: Four times a day (QID) | ORAL | Status: DC | PRN
  Administered 2016-07-15: 4 mg via ORAL
  Filled 2016-07-14: qty 1

## 2016-07-14 MED ORDER — DOCUSATE SODIUM 100 MG PO CAPS
100.0000 mg | ORAL_CAPSULE | Freq: Two times a day (BID) | ORAL | Status: DC
  Administered 2016-07-15 – 2016-07-17 (×5): 100 mg via ORAL
  Filled 2016-07-14 (×6): qty 1

## 2016-07-14 MED ORDER — ACETAMINOPHEN 650 MG RE SUPP: 650.0000 mg | Freq: Four times a day (QID) | RECTAL | Status: DC | PRN

## 2016-07-14 MED ORDER — GABAPENTIN 300 MG PO CAPS
300.0000 mg | ORAL_CAPSULE | Freq: Two times a day (BID) | ORAL | Status: DC
  Administered 2016-07-15 – 2016-07-25 (×22): 300 mg via ORAL
  Filled 2016-07-14 (×22): qty 1

## 2016-07-14 MED ORDER — PRAVASTATIN SODIUM 20 MG PO TABS
20.0000 mg | ORAL_TABLET | Freq: Every day | ORAL | Status: DC
  Administered 2016-07-15 – 2016-07-25 (×10): 20 mg via ORAL
  Filled 2016-07-14 (×10): qty 1

## 2016-07-14 MED ORDER — ONDANSETRON HCL 4 MG/2ML IJ SOLN
4.0000 mg | Freq: Four times a day (QID) | INTRAMUSCULAR | Status: DC | PRN
  Filled 2016-07-14: qty 2

## 2016-07-14 MED ORDER — IOPAMIDOL (ISOVUE-300) INJECTION 61%
100.0000 mL | Freq: Once | INTRAVENOUS | Status: AC | PRN
  Administered 2016-07-14: 100 mL via INTRAVENOUS

## 2016-07-14 MED ORDER — DEXTROSE 5 % IV SOLN
500.0000 mg | Freq: Four times a day (QID) | INTRAVENOUS | Status: DC | PRN
  Administered 2016-07-15: 500 mg via INTRAVENOUS
  Filled 2016-07-14: qty 550
  Filled 2016-07-14: qty 5

## 2016-07-14 MED ORDER — MORPHINE SULFATE (PF) 2 MG/ML IV SOLN
2.0000 mg | INTRAVENOUS | Status: DC | PRN
  Administered 2016-07-15 – 2016-07-18 (×10): 2 mg via INTRAVENOUS
  Filled 2016-07-14 (×11): qty 1

## 2016-07-14 MED ORDER — POTASSIUM CHLORIDE 10 MEQ/100ML IV SOLN
10.0000 meq | Freq: Once | INTRAVENOUS | Status: AC
  Administered 2016-07-14: 10 meq via INTRAVENOUS
  Filled 2016-07-14: qty 100

## 2016-07-14 MED ORDER — POTASSIUM CHLORIDE CRYS ER 20 MEQ PO TBCR
40.0000 meq | EXTENDED_RELEASE_TABLET | Freq: Once | ORAL | Status: AC
  Administered 2016-07-14: 40 meq via ORAL
  Filled 2016-07-14: qty 2

## 2016-07-14 MED ORDER — AMLODIPINE BESYLATE 10 MG PO TABS
10.0000 mg | ORAL_TABLET | Freq: Every day | ORAL | Status: DC
  Administered 2016-07-15 – 2016-07-24 (×10): 10 mg via ORAL
  Filled 2016-07-14 (×11): qty 1

## 2016-07-14 NOTE — ED Notes (Signed)
CRITICAL VALUE ALERT  Critical Value: Potassium 2.5  Date & Time Notied: 07/14/2016 1652  Provider Notified: Dr. Gilford Raid

## 2016-07-14 NOTE — ED Triage Notes (Signed)
Pt reports feeling of fatigue and generally not feeling well since waking this morning.

## 2016-07-14 NOTE — ED Notes (Signed)
Phlebotomy at bedside.

## 2016-07-14 NOTE — ED Notes (Signed)
Pt to receive 2 units of blood. Per Dr. Lorin Mercy, pt can wait to receive the blood once she is transferred to [facility].

## 2016-07-14 NOTE — ED Provider Notes (Signed)
[place] DEPT Provider Note   CSN: [id number] Arrival date & time: 07/14/16  1550     History   Chief Complaint Chief Complaint  Patient presents with  . Weakness    HPI Heather Hinton is a 56 y.o. female.  Pt presents to the ED today with fatigue and not feeling here.  Pt did not know she had a fever, but felt hot.  The pt has spinal stenosis and has denies any new pain.      Past Medical History:  Diagnosis Date  . Hypertension   . Spinal stenosis     Patient Active Problem List   Diagnosis Date Noted  . 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 Metastasis to bone ([place])   . Metastatic lung cancer (metastasis from lung to other site) ([place]) 07/14/2016  . Essential hypertension 07/14/2016  . Hypokalemia 07/14/2016  . Hyperglycemia 07/14/2016  . Symptomatic anemia 07/14/2016  . Thrombocytosis ([place]) 07/14/2016    Past Surgical History:  Procedure Laterality Date  . TUBAL LIGATION      OB History    Gravida Para Term Preterm AB Living   9 8           SAB TAB Ectopic Multiple Live Births                   Home Medications    Prior to Admission medications   Medication Sig Start Date End Date Taking? Authorizing Provider  amLODipine (NORVASC) 10 MG tablet Take 10 mg by mouth at bedtime.   Yes [provider]  gabapentin (NEURONTIN) 300 MG capsule Take 300 mg by mouth 2 (two) times daily.   Yes [provider]  lovastatin (MEVACOR) 20 MG tablet Take 20 mg by mouth at bedtime.   Yes [provider]  Multiple Vitamin (MULTIVITAMIN WITH MINERALS) TABS tablet Take 1 tablet by mouth daily.   Yes [provider]    Family History Family History  Problem Relation Age of Onset  . Hypertension Mother   . Diabetes Mother   . Breast cancer Mother   . Hypertension Father   . Hypertension Sister   . Pancreatic cancer Sister   . Lung cancer Maternal Grandmother     Social History Social History  Substance Use Topics  .  Smoking status: Former Smoker    Packs/day: 0.50    Years: 40.00    Types: Cigarettes    Quit date: 10/2015  . Smokeless tobacco: Never Used  . Alcohol use No     Allergies   Iron   Review of Systems Review of Systems  Constitutional: Positive for fatigue.  Neurological: Positive for weakness.  All other systems reviewed and are negative.    Physical Exam Updated Vital Signs BP 129/88 (BP Location: Left Arm)   Pulse (!) 114   Temp (!) 100.5 F (38.1 C) (Oral)   Resp 18   Ht 5\' 7"  (1.702 m)   Wt 69.4 kg (153 lb)   SpO2 98%   BMI 23.96 kg/m   Physical Exam  Constitutional: She is oriented to person, place, and time. She appears well-developed and well-nourished.  HENT:  Head: Normocephalic and atraumatic.  Right Ear: External ear normal.  Left Ear: External ear normal.  Nose: Nose normal.  Mouth/Throat: Oropharynx is clear and moist.  Eyes: Conjunctivae and EOM are normal. Pupils are equal, round, and reactive to light.  Neck: Normal range of motion. Neck supple.  Cardiovascular: Normal rate, regular rhythm, normal heart  sounds and intact distal pulses.   Pulmonary/Chest: Effort normal and breath sounds normal.  Abdominal: Soft. Bowel sounds are normal.  Genitourinary: Rectal exam shows guaiac positive stool.  Musculoskeletal:       Left hip: She exhibits decreased range of motion and tenderness.  Neurological: She is alert and oriented to person, place, and time.  Skin: Skin is warm.  Psychiatric: She has a normal mood and affect. Her behavior is normal. Judgment and thought content normal.  Nursing note and vitals reviewed.    ED Treatments / Results  Labs (all labs ordered are listed, but only abnormal results are displayed) Labs Reviewed  COMPREHENSIVE METABOLIC PANEL - Abnormal; Notable for the following:       Result Value   Potassium 2.5 (*)    Glucose, Bld 115 (*)    Albumin 2.5 (*)    AST 14 (*)    ALT 11 (*)    Alkaline Phosphatase 174 (*)     All other components within normal limits  CBC WITH DIFFERENTIAL/PLATELET - Abnormal; Notable for the following:    WBC 22.4 (*)    RBC 3.28 (*)    Hemoglobin 7.9 (*)    HCT 25.9 (*)    MCH 24.1 (*)    RDW 16.1 (*)    Platelets 743 (*)    Neutro Abs 17.7 (*)    Monocytes Absolute 1.2 (*)    All other components within normal limits  URINALYSIS, ROUTINE W REFLEX MICROSCOPIC - Abnormal; Notable for the following:    Color, Urine STRAW (*)    All other components within normal limits  BASIC METABOLIC PANEL - Abnormal; Notable for the following:    Potassium 2.8 (*)    Glucose, Bld 120 (*)    Calcium 8.5 (*)    All other components within normal limits  CBC - Abnormal; Notable for the following:    WBC 29.1 (*)    Hemoglobin 10.6 (*)    HCT 31.9 (*)    MCV 76.7 (*)    MCH 25.5 (*)    Platelets 660 (*)    All other components within normal limits  POC OCCULT BLOOD, ED - Abnormal; Notable for the following:    Fecal Occult Bld POSITIVE (*)    All other components within normal limits  MAGNESIUM  HIV ANTIBODY (ROUTINE TESTING)  TYPE AND SCREEN  PREPARE RBC (CROSSMATCH)  ABO/RH  TYPE AND SCREEN  ABO/RH    EKG  EKG Interpretation None       Radiology Dg Chest 2 View  Result Date: 07/14/2016 CLINICAL DATA:  Fatigue EXAM: CHEST  2 VIEW COMPARISON:  04/15/2016 FINDINGS: Cardiac shadow is within normal limits. Lungs are well aerated bilaterally. Right middle lobe infiltrate with volume loss is noted. Additionally there is a a rounded masslike density in the lateral aspect of the right measuring approximately 3 cm. Considerable right peritracheal and right hilar adenopathy is noted. These changes are highly suspicious for underlying neoplasm. Further evaluation by means of CT of the chest with contrast is recommended. No bony abnormality is seen. IMPRESSION: Changes consistent with a right middle lobe mass and central hilar and mediastinal adenopathy with partial right middle  lobe collapse. This is consistent with neoplasm and CT evaluation is recommended. Electronically Signed   By: Inez Catalina M.D.   On: 07/14/2016 16:33   Ct Chest W Contrast  Result Date: 07/14/2016 CLINICAL DATA:  Right lung mass and right middle lobe collapse EXAM: CT CHEST,  ABDOMEN, AND PELVIS WITH CONTRAST TECHNIQUE: Multidetector CT imaging of the chest, abdomen and pelvis was performed following the standard protocol during bolus administration of intravenous contrast. CONTRAST:  179mL ISOVUE-300 IOPAMIDOL (ISOVUE-300) INJECTION 61% COMPARISON:  Chest x-ray from earlier in the same day. FINDINGS: CT CHEST FINDINGS Cardiovascular: The thoracic aorta shows a bovine it branching pattern. No aneurysmal dilatation or dissection is seen. No cardiac enlargement is noted. The pulmonary artery as visualized is within normal limits with the exception of attenuation of branches on the right secondary to hilar and mediastinal adenopathy. Mediastinum/Nodes: Thoracic inlet is within normal limits. Multiple mediastinal mobile masses are seen. One is noted in the anterior mediastinum measuring 5.8 x 2.9 cm. A second is noted anterior to the carina measuring at least 5.1 by 5.8 cm. This mildly displaces the superior vena cava and causes mass effect upon the right main pulmonary artery. Subcarinal nodal mass is also noted measuring 4.5 by 3.4 cm in greatest dimension. Smaller right hilar nodes are seen. The largest of these measures 26 mm in short axis. The esophagus as visualize is within normal limits. Lungs/Pleura: The left lung demonstrates some emphysematous changes. No focal infiltrate or sizable effusion is seen. There is a 1.8 x 1.0 soft tissue density along the posteromedial aspect of the left lung. This appears to be within the chest wall as opposed to within the lung itself. The right lung also demonstrates significant emphysematous changes. There is a mass lesion which measures 2.8 by 3.0 cm in greatest transverse  and AP dimension within the right middle lobe as seen on previous chest x-ray. Near complete collapse of the right middle lobe is seen secondary to central bronchial occlusion. Musculoskeletal: Degenerative changes of the thoracic spine are noted. No definitive lytic or sclerotic lesions are seen. CT ABDOMEN PELVIS FINDINGS Hepatobiliary: The liver demonstrates a focal hyperdense lesion likely representing a small hemangioma. The gallbladder is within normal limits. Pancreas: Pancreas is within normal limits. Spleen: Normal in size without focal abnormality. Adrenals/Urinary Tract: The adrenal glands are unremarkable. The kidneys demonstrate normal enhancement bilaterally. Renal cyst is noted on the left. A few small nonobstructing calculi are noted in the right kidney. Mild scarring in the right kidney is noted as well. The bladder is partially distended. Stomach/Bowel: No obstructive or inflammatory changes are seen. The appendix is within normal limits. Vascular/Lymphatic: Aortic atherosclerosis. No enlarged abdominal or pelvic lymph nodes. Reproductive: The uterus is within normal limits and somewhat displaced to the right. Ovarian cystic changes are seen bilaterally. Other: No abdominal wall hernia or abnormality. No abdominopelvic ascites. Musculoskeletal: In the left ischial tuberosity and extending superiorly into the posterior aspect of the left acetabulum, there is a lytic lesion with significant soft tissue component. It nearly completely destroyed is the ischial tuberosity and posterior elements of the right acetabulum. The soft tissue component extends inferiorly along the obturator foramen and measures approximately 12 by 8.6 cm in greatest dimension. It also extends superiorly along the left pelvic wall. IMPRESSION: Right middle lobe mass lesion with associated hilar and mediastinal adenopathy and near complete right middle lobe collapse. This is consistent with a primary pulmonary neoplasm with  metastatic lesions till proven otherwise. PET-CT and tissue sampling are recommended for further evaluation. Small soft tissue nodule along the posterior costophrenic angle medially on the left. This may represent a small metastatic focus. Bony metastatic disease involving the ischial tuberosity on the left with large significant surrounding soft tissue component consistent with metastatic disease. Electronically Signed  By: Inez Catalina M.D.   On: 07/14/2016 18:10   Ct Abdomen Pelvis W Contrast  Result Date: 07/14/2016 CLINICAL DATA:  Right lung mass and right middle lobe collapse EXAM: CT CHEST, ABDOMEN, AND PELVIS WITH CONTRAST TECHNIQUE: Multidetector CT imaging of the chest, abdomen and pelvis was performed following the standard protocol during bolus administration of intravenous contrast. CONTRAST:  134mL ISOVUE-300 IOPAMIDOL (ISOVUE-300) INJECTION 61% COMPARISON:  Chest x-ray from earlier in the same day. FINDINGS: CT CHEST FINDINGS Cardiovascular: The thoracic aorta shows a bovine it branching pattern. No aneurysmal dilatation or dissection is seen. No cardiac enlargement is noted. The pulmonary artery as visualized is within normal limits with the exception of attenuation of branches on the right secondary to hilar and mediastinal adenopathy. Mediastinum/Nodes: Thoracic inlet is within normal limits. Multiple mediastinal mobile masses are seen. One is noted in the anterior mediastinum measuring 5.8 x 2.9 cm. A second is noted anterior to the carina measuring at least 5.1 by 5.8 cm. This mildly displaces the superior vena cava and causes mass effect upon the right main pulmonary artery. Subcarinal nodal mass is also noted measuring 4.5 by 3.4 cm in greatest dimension. Smaller right hilar nodes are seen. The largest of these measures 26 mm in short axis. The esophagus as visualize is within normal limits. Lungs/Pleura: The left lung demonstrates some emphysematous changes. No focal infiltrate or sizable  effusion is seen. There is a 1.8 x 1.0 soft tissue density along the posteromedial aspect of the left lung. This appears to be within the chest wall as opposed to within the lung itself. The right lung also demonstrates significant emphysematous changes. There is a mass lesion which measures 2.8 by 3.0 cm in greatest transverse and AP dimension within the right middle lobe as seen on previous chest x-ray. Near complete collapse of the right middle lobe is seen secondary to central bronchial occlusion. Musculoskeletal: Degenerative changes of the thoracic spine are noted. No definitive lytic or sclerotic lesions are seen. CT ABDOMEN PELVIS FINDINGS Hepatobiliary: The liver demonstrates a focal hyperdense lesion likely representing a small hemangioma. The gallbladder is within normal limits. Pancreas: Pancreas is within normal limits. Spleen: Normal in size without focal abnormality. Adrenals/Urinary Tract: The adrenal glands are unremarkable. The kidneys demonstrate normal enhancement bilaterally. Renal cyst is noted on the left. A few small nonobstructing calculi are noted in the right kidney. Mild scarring in the right kidney is noted as well. The bladder is partially distended. Stomach/Bowel: No obstructive or inflammatory changes are seen. The appendix is within normal limits. Vascular/Lymphatic: Aortic atherosclerosis. No enlarged abdominal or pelvic lymph nodes. Reproductive: The uterus is within normal limits and somewhat displaced to the right. Ovarian cystic changes are seen bilaterally. Other: No abdominal wall hernia or abnormality. No abdominopelvic ascites. Musculoskeletal: In the left ischial tuberosity and extending superiorly into the posterior aspect of the left acetabulum, there is a lytic lesion with significant soft tissue component. It nearly completely destroyed is the ischial tuberosity and posterior elements of the right acetabulum. The soft tissue component extends inferiorly along the  obturator foramen and measures approximately 12 by 8.6 cm in greatest dimension. It also extends superiorly along the left pelvic wall. IMPRESSION: Right middle lobe mass lesion with associated hilar and mediastinal adenopathy and near complete right middle lobe collapse. This is consistent with a primary pulmonary neoplasm with metastatic lesions till proven otherwise. PET-CT and tissue sampling are recommended for further evaluation. Small soft tissue nodule along the posterior costophrenic  angle medially on the left. This may represent a small metastatic focus. Bony metastatic disease involving the ischial tuberosity on the left with large significant surrounding soft tissue component consistent with metastatic disease. Electronically Signed   By: Inez Catalina M.D.   On: 07/14/2016 18:10    Procedures Procedures (including critical care time)  Medications Ordered in ED Medications  amLODipine (NORVASC) tablet 10 mg (10 mg Oral Given 07/15/16 0018)  gabapentin (NEURONTIN) capsule 300 mg (300 mg Oral Given 07/15/16 0921)  pravastatin (PRAVACHOL) tablet 20 mg (not administered)  0.9 %  sodium chloride infusion ( Intravenous Not Given 07/14/16 2330)  acetaminophen (TYLENOL) tablet 650 mg (650 mg Oral Given 07/15/16 0403)    Or  acetaminophen (TYLENOL) suppository 650 mg ( Rectal See Alternative 07/15/16 0403)  docusate sodium (COLACE) capsule 100 mg (100 mg Oral Given 07/15/16 0922)  ondansetron (ZOFRAN) tablet 4 mg (4 mg Oral Given 07/15/16 0453)    Or  ondansetron (ZOFRAN) injection 4 mg ( Intravenous See Alternative 07/15/16 0453)  0.9 % NaCl with KCl 40 mEq / L  infusion (100 mL/hr Intravenous New Bag/Given 07/15/16 0019)  methocarbamol (ROBAXIN) 500 mg in dextrose 5 % 50 mL IVPB (500 mg Intravenous New Bag/Given 07/15/16 0019)  morphine 2 MG/ML injection 2 mg (2 mg Intravenous Given 07/15/16 1111)  piperacillin-tazobactam (ZOSYN) IVPB 3.375 g (0 g Intravenous Stopped 07/15/16 1321)  HYDROcodone-acetaminophen  (NORCO/VICODIN) 5-325 MG per tablet 1 tablet (not administered)  morphine (MS CONTIN) 12 hr tablet 15 mg (15 mg Oral Given 07/15/16 1330)  dexamethasone (DECADRON) tablet 2 mg (2 mg Oral Given 07/15/16 1330)  potassium chloride SA (K-DUR,KLOR-CON) CR tablet 40 mEq (not administered)  sodium chloride 0.9 % bolus 1,000 mL (0 mLs Intravenous Stopped 07/14/16 1813)  acetaminophen (TYLENOL) tablet 1,000 mg (1,000 mg Oral Given 07/14/16 1633)  potassium chloride 10 mEq in 100 mL IVPB (0 mEq Intravenous Stopped 07/14/16 1848)  iopamidol (ISOVUE-300) 61 % injection 100 mL (100 mLs Intravenous Contrast Given 07/14/16 1734)  famotidine (PEPCID) IVPB 20 mg premix (0 mg Intravenous Stopped 07/14/16 1952)  potassium chloride SA (K-DUR,KLOR-CON) CR tablet 40 mEq (40 mEq Oral Given 07/14/16 2032)     Initial Impression / Assessment and Plan / ED Course  I have reviewed the triage vital signs and the nursing notes.  Pertinent labs & imaging results that were available during my care of the patient were reviewed by me and considered in my medical decision making (see chart for details).    Pt and her husband told of abnormal CT results.    Pt is anemic, so I ordered 2 units for transfusion.  I think it is mainly from the cancer, but her stool was guaiac +.  Pt was d/w Dr. Lorin Mercy (triad).  She admitted pt and sent her to [facility] for pain control and expedited work up.    Final Clinical Impressions(s) / ED Diagnoses   Final diagnoses:  Hypokalemia  Primary malignant neoplasm of right lung metastatic to other site [facility])  Symptomatic anemia  Fever, unspecified fever cause  Metastasis to bone ([facility])  Guaiac positive stools  Leukocytosis, unspecified type  Left hip pain    New Prescriptions Current Discharge Medication List       Isla Pence, MD 07/15/16 1513

## 2016-07-14 NOTE — ED Notes (Signed)
EDP at bedside  

## 2016-07-14 NOTE — H&P (Addendum)
History and Physical    Heather Hinton ZOX:[id number] DOB: 1960/03/09 DOA: 07/14/2016  PCP: Neale Burly, MD Consultants:  None Patient coming from: Home - lives with granddaughter and husband; NOK: husband, [phone]  Chief Complaint: weakness  HPI: Heather Hinton is a 56 y.o. female with medical history significant of HTN and recent (3 months) h/o spinal stenosis presenting with weakness.  She reports that she came here today because she felt very hot and was sweating a lot.  Daughter encouraged her to come to the ER.  She has been seeing her doctor for spinal stenosis- pain in left hip and radiating down leg.  That has been happening for about 3 months.  Unable to lay on or sit on her left hip for the last year.  Coughs a little while lying down and turning and mildly SOB with exertion.  No other pain, no chest pain.  +weight loss maybe 10+ pounds in about 3 months (172 on 3/4 ER visit for cough, negative CXR; not weighed on 3/22 ER visit for hip pain; was 148 on 4/24 ER visit for hip pain; 153 today).  She has been feeling weak/tired.  She was told last week that her WBC count was high.  CXR from 3/4/8 was negative. Hip x-ray from 3/22 was negative. MRI L-spine from 5/5 showed disc and severe facet degeneration; disc herniation with mild-moderate stenosis.   ED Course: Metastatic lung cancer to left pelvis by CXR/CT results.  +sx anemia, ordered 2 units PRBC.  Heme positive.  Review of Systems: As per HPI; otherwise review of systems reviewed and negative.   Ambulatory Status:  Ambulates with a cane over the last 3 months  Past Medical History:  Diagnosis Date  . Hypertension   . Spinal stenosis     Past Surgical History:  Procedure Laterality Date  . TUBAL LIGATION      Social History   Social History  . Marital status: Legally Separated    Spouse name: N/A  . Number of children: N/A  . Years of education: N/A   Occupational History  . Unemployed    Social  History Main Topics  . Smoking status: Former Smoker    Packs/day: 0.50    Years: 40.00    Types: Cigarettes    Quit date: 10/2015  . Smokeless tobacco: Never Used  . Alcohol use No  . Drug use: No  . Sexual activity: Yes   Other Topics Concern  . Not on file   Social History Narrative  . No narrative on file    Allergies  Allergen Reactions  . Iron Nausea And Vomiting    Family History  Problem Relation Age of Onset  . Hypertension Mother   . Diabetes Mother   . Breast cancer Mother   . Hypertension Father   . Hypertension Sister   . Pancreatic cancer Sister   . Lung cancer Maternal Grandmother     Prior to Admission medications   Medication Sig Start Date End Date Taking? Authorizing Provider  amLODipine (NORVASC) 5 MG tablet Take 5 mg by mouth daily.    [provider]  ibuprofen (ADVIL,MOTRIN) 800 MG tablet Take 1 tablet (800 mg total) by mouth every 8 (eight) hours as needed. 06/05/16   Long, Wonda Olds, MD  methocarbamol (ROBAXIN) 500 MG tablet Take 1 tablet (500 mg total) by mouth 2 (two) times daily. 06/05/16   Long, Wonda Olds, MD  predniSONE (STERAPRED UNI-PAK 21 TAB) 5 MG (21) TBPK tablet  Take 6 pills first day; 5 pills second day; 4 pills third day; 3 pills fourth day; 2 pills next day and 1 pill last day. 05/24/16   Sanjuana Kava, MD  traMADol (ULTRAM) 50 MG tablet Take 1 tablet (50 mg total) by mouth every 6 (six) hours as needed. 06/05/16   Margette Fast, MD    Physical Exam: Vitals:   07/14/16 2000 07/14/16 2015 07/14/16 2130 07/14/16 2200  BP: (!) 151/73  (!) 143/76   Pulse: (!) 104 (!) 104  (!) 111  Resp:      Temp:      TempSrc:      SpO2: 96% 98%  98%  Weight:      Height:         General:  Appears calm and comfortable and is NAD Eyes:  PERRL, EOMI, normal lids, iris ENT:  grossly normal hearing, lips & tongue, mmm Neck:  no LAD, masses or thyromegaly Cardiovascular:  RRR, no m/r/g. No LE edema.  Respiratory:  CTA bilaterally, no  w/r/r. Normal respiratory effort. Abdomen:  soft, ntnd, NABS Skin:  no rash or induration seen on limited exam Musculoskeletal:  She has muscle wasting along her thighs and hips with bony/soft tissue prominence in her left gluteal region Psychiatric:  grossly normal mood and affect, speech fluent and appropriate, AOx3 Neurologic:  CN 2-12 grossly intact, moves all extremities in coordinated fashion, sensation intact  Labs on Admission: I have personally reviewed following labs and imaging studies  CBC:  Recent Labs Lab 07/14/16 1612  WBC 22.4*  NEUTROABS 17.7*  HGB 7.9*  HCT 25.9*  MCV 79.0  PLT 433*   Basic Metabolic Panel:  Recent Labs Lab 07/14/16 1612  NA 142  K 2.5*  CL 104  CO2 25  GLUCOSE 115*  BUN 12  CREATININE 0.82  CALCIUM 9.1  MG 2.1   GFR: Estimated Creatinine Clearance: 74.5 mL/min (by C-G formula based on SCr of 0.82 mg/dL). Liver Function Tests:  Recent Labs Lab 07/14/16 1612  AST 14*  ALT 11*  ALKPHOS 174*  BILITOT 0.6  PROT 8.1  ALBUMIN 2.5*   No results for input(s): LIPASE, AMYLASE in the last 168 hours. No results for input(s): AMMONIA in the last 168 hours. Coagulation Profile: No results for input(s): INR, PROTIME in the last 168 hours. Cardiac Enzymes: No results for input(s): CKTOTAL, CKMB, CKMBINDEX, TROPONINI in the last 168 hours. BNP (last 3 results) No results for input(s): PROBNP in the last 8760 hours. HbA1C: No results for input(s): HGBA1C in the last 72 hours. CBG: No results for input(s): GLUCAP in the last 168 hours. Lipid Profile: No results for input(s): CHOL, HDL, LDLCALC, TRIG, CHOLHDL, LDLDIRECT in the last 72 hours. Thyroid Function Tests: No results for input(s): TSH, T4TOTAL, FREET4, T3FREE, THYROIDAB in the last 72 hours. Anemia Panel: No results for input(s): VITAMINB12, FOLATE, FERRITIN, TIBC, IRON, RETICCTPCT in the last 72 hours. Urine analysis:    Component Value Date/Time   COLORURINE STRAW (A)  07/14/2016 1856   APPEARANCEUR CLEAR 07/14/2016 1856   LABSPEC 1.020 07/14/2016 1856   PHURINE 7.0 07/14/2016 1856   GLUCOSEU NEGATIVE 07/14/2016 1856   HGBUR NEGATIVE 07/14/2016 1856   BILIRUBINUR NEGATIVE 07/14/2016 1856   KETONESUR NEGATIVE 07/14/2016 1856   PROTEINUR NEGATIVE 07/14/2016 1856   NITRITE NEGATIVE 07/14/2016 1856   LEUKOCYTESUR NEGATIVE 07/14/2016 1856    Creatinine Clearance: Estimated Creatinine Clearance: 74.5 mL/min (by C-G formula based on SCr of 0.82 mg/dL).  Sepsis  Labs: @LABRCNTIP (procalcitonin:4,lacticidven:4) )No results found for this or any previous visit (from the past 240 hour(s)).   Radiological Exams on Admission: Dg Chest 2 View  Result Date: 07/14/2016 CLINICAL DATA:  Fatigue EXAM: CHEST  2 VIEW COMPARISON:  04/15/2016 FINDINGS: Cardiac shadow is within normal limits. Lungs are well aerated bilaterally. Right middle lobe infiltrate with volume loss is noted. Additionally there is a a rounded masslike density in the lateral aspect of the right measuring approximately 3 cm. Considerable right peritracheal and right hilar adenopathy is noted. These changes are highly suspicious for underlying neoplasm. Further evaluation by means of CT of the chest with contrast is recommended. No bony abnormality is seen. IMPRESSION: Changes consistent with a right middle lobe mass and central hilar and mediastinal adenopathy with partial right middle lobe collapse. This is consistent with neoplasm and CT evaluation is recommended. Electronically Signed   By: Inez Catalina M.D.   On: 07/14/2016 16:33   Ct Chest W Contrast  Result Date: 07/14/2016 CLINICAL DATA:  Right lung mass and right middle lobe collapse EXAM: CT CHEST, ABDOMEN, AND PELVIS WITH CONTRAST TECHNIQUE: Multidetector CT imaging of the chest, abdomen and pelvis was performed following the standard protocol during bolus administration of intravenous contrast. CONTRAST:  120mL ISOVUE-300 IOPAMIDOL (ISOVUE-300)  INJECTION 61% COMPARISON:  Chest x-ray from earlier in the same day. FINDINGS: CT CHEST FINDINGS Cardiovascular: The thoracic aorta shows a bovine it branching pattern. No aneurysmal dilatation or dissection is seen. No cardiac enlargement is noted. The pulmonary artery as visualized is within normal limits with the exception of attenuation of branches on the right secondary to hilar and mediastinal adenopathy. Mediastinum/Nodes: Thoracic inlet is within normal limits. Multiple mediastinal mobile masses are seen. One is noted in the anterior mediastinum measuring 5.8 x 2.9 cm. A second is noted anterior to the carina measuring at least 5.1 by 5.8 cm. This mildly displaces the superior vena cava and causes mass effect upon the right main pulmonary artery. Subcarinal nodal mass is also noted measuring 4.5 by 3.4 cm in greatest dimension. Smaller right hilar nodes are seen. The largest of these measures 26 mm in short axis. The esophagus as visualize is within normal limits. Lungs/Pleura: The left lung demonstrates some emphysematous changes. No focal infiltrate or sizable effusion is seen. There is a 1.8 x 1.0 soft tissue density along the posteromedial aspect of the left lung. This appears to be within the chest wall as opposed to within the lung itself. The right lung also demonstrates significant emphysematous changes. There is a mass lesion which measures 2.8 by 3.0 cm in greatest transverse and AP dimension within the right middle lobe as seen on previous chest x-ray. Near complete collapse of the right middle lobe is seen secondary to central bronchial occlusion. Musculoskeletal: Degenerative changes of the thoracic spine are noted. No definitive lytic or sclerotic lesions are seen. CT ABDOMEN PELVIS FINDINGS Hepatobiliary: The liver demonstrates a focal hyperdense lesion likely representing a small hemangioma. The gallbladder is within normal limits. Pancreas: Pancreas is within normal limits. Spleen: Normal in  size without focal abnormality. Adrenals/Urinary Tract: The adrenal glands are unremarkable. The kidneys demonstrate normal enhancement bilaterally. Renal cyst is noted on the left. A few small nonobstructing calculi are noted in the right kidney. Mild scarring in the right kidney is noted as well. The bladder is partially distended. Stomach/Bowel: No obstructive or inflammatory changes are seen. The appendix is within normal limits. Vascular/Lymphatic: Aortic atherosclerosis. No enlarged abdominal or pelvic lymph  nodes. Reproductive: The uterus is within normal limits and somewhat displaced to the right. Ovarian cystic changes are seen bilaterally. Other: No abdominal wall hernia or abnormality. No abdominopelvic ascites. Musculoskeletal: In the left ischial tuberosity and extending superiorly into the posterior aspect of the left acetabulum, there is a lytic lesion with significant soft tissue component. It nearly completely destroyed is the ischial tuberosity and posterior elements of the right acetabulum. The soft tissue component extends inferiorly along the obturator foramen and measures approximately 12 by 8.6 cm in greatest dimension. It also extends superiorly along the left pelvic wall. IMPRESSION: Right middle lobe mass lesion with associated hilar and mediastinal adenopathy and near complete right middle lobe collapse. This is consistent with a primary pulmonary neoplasm with metastatic lesions till proven otherwise. PET-CT and tissue sampling are recommended for further evaluation. Small soft tissue nodule along the posterior costophrenic angle medially on the left. This may represent a small metastatic focus. Bony metastatic disease involving the ischial tuberosity on the left with large significant surrounding soft tissue component consistent with metastatic disease. Electronically Signed   By: Inez Catalina M.D.   On: 07/14/2016 18:10   Ct Abdomen Pelvis W Contrast  Result Date: 07/14/2016 CLINICAL  DATA:  Right lung mass and right middle lobe collapse EXAM: CT CHEST, ABDOMEN, AND PELVIS WITH CONTRAST TECHNIQUE: Multidetector CT imaging of the chest, abdomen and pelvis was performed following the standard protocol during bolus administration of intravenous contrast. CONTRAST:  181mL ISOVUE-300 IOPAMIDOL (ISOVUE-300) INJECTION 61% COMPARISON:  Chest x-ray from earlier in the same day. FINDINGS: CT CHEST FINDINGS Cardiovascular: The thoracic aorta shows a bovine it branching pattern. No aneurysmal dilatation or dissection is seen. No cardiac enlargement is noted. The pulmonary artery as visualized is within normal limits with the exception of attenuation of branches on the right secondary to hilar and mediastinal adenopathy. Mediastinum/Nodes: Thoracic inlet is within normal limits. Multiple mediastinal mobile masses are seen. One is noted in the anterior mediastinum measuring 5.8 x 2.9 cm. A second is noted anterior to the carina measuring at least 5.1 by 5.8 cm. This mildly displaces the superior vena cava and causes mass effect upon the right main pulmonary artery. Subcarinal nodal mass is also noted measuring 4.5 by 3.4 cm in greatest dimension. Smaller right hilar nodes are seen. The largest of these measures 26 mm in short axis. The esophagus as visualize is within normal limits. Lungs/Pleura: The left lung demonstrates some emphysematous changes. No focal infiltrate or sizable effusion is seen. There is a 1.8 x 1.0 soft tissue density along the posteromedial aspect of the left lung. This appears to be within the chest wall as opposed to within the lung itself. The right lung also demonstrates significant emphysematous changes. There is a mass lesion which measures 2.8 by 3.0 cm in greatest transverse and AP dimension within the right middle lobe as seen on previous chest x-ray. Near complete collapse of the right middle lobe is seen secondary to central bronchial occlusion. Musculoskeletal: Degenerative  changes of the thoracic spine are noted. No definitive lytic or sclerotic lesions are seen. CT ABDOMEN PELVIS FINDINGS Hepatobiliary: The liver demonstrates a focal hyperdense lesion likely representing a small hemangioma. The gallbladder is within normal limits. Pancreas: Pancreas is within normal limits. Spleen: Normal in size without focal abnormality. Adrenals/Urinary Tract: The adrenal glands are unremarkable. The kidneys demonstrate normal enhancement bilaterally. Renal cyst is noted on the left. A few small nonobstructing calculi are noted in the right kidney. Mild  scarring in the right kidney is noted as well. The bladder is partially distended. Stomach/Bowel: No obstructive or inflammatory changes are seen. The appendix is within normal limits. Vascular/Lymphatic: Aortic atherosclerosis. No enlarged abdominal or pelvic lymph nodes. Reproductive: The uterus is within normal limits and somewhat displaced to the right. Ovarian cystic changes are seen bilaterally. Other: No abdominal wall hernia or abnormality. No abdominopelvic ascites. Musculoskeletal: In the left ischial tuberosity and extending superiorly into the posterior aspect of the left acetabulum, there is a lytic lesion with significant soft tissue component. It nearly completely destroyed is the ischial tuberosity and posterior elements of the right acetabulum. The soft tissue component extends inferiorly along the obturator foramen and measures approximately 12 by 8.6 cm in greatest dimension. It also extends superiorly along the left pelvic wall. IMPRESSION: Right middle lobe mass lesion with associated hilar and mediastinal adenopathy and near complete right middle lobe collapse. This is consistent with a primary pulmonary neoplasm with metastatic lesions till proven otherwise. PET-CT and tissue sampling are recommended for further evaluation. Small soft tissue nodule along the posterior costophrenic angle medially on the left. This may  represent a small metastatic focus. Bony metastatic disease involving the ischial tuberosity on the left with large significant surrounding soft tissue component consistent with metastatic disease. Electronically Signed   By: Inez Catalina M.D.   On: 07/14/2016 18:10    EKG: Not done  Assessment/Plan Principal Problem:   Metastatic lung cancer (metastasis from lung to other site) [facility]) Active Problems:   Essential hypertension   Hypokalemia   Hyperglycemia   Symptomatic anemia   Thrombocytosis (HCC)   Metastatic lung CA -Tragic situation -Patient with a long-standing smoking history presenting with apparent new-onset lung CA with mets to pelvis -She has been seeing ER and PCP for persistent left hip pain that was thought to be sciatica based on abnormal spinal MRI from 1 month ago -She also had a negative CXR and blood work just 3 months ago -This is obviously a rapidly progressive malignancy which indicates a possible poor prognosis -Her greatest complaint is the hip pain and she may be a candidate for radiation oncology for that issue -Her work-up may be expedited if she is admitted to [facility] rather than here at [facility] (where she is unlikely to see oncology until Monday and where rad onc is not available) -After discussion, patient/family prefer transfer to [place] -Patient will need oncology consultation in the AM -AP 174; prior 75 on 3/4 - likely from bony mets -WBC 22.4; Platelets 743; prior 8.9/368 on 3/4 - likely reactive in nature -She will need tissue biopsy - either via bronchoscopy or via IR-directed needle biopsy in all likelihood - probably early next week. -For now, IVF, pain control, and transfer for expert consultation  Symptomatic anemia -Hgb 7.9; prior 9.5 on 3/4 -Patient without other values available for consideration -She has been feeling very weak and tired and somewhat SOB with exertion -This may be a product of the cancer itself, but it is reasonable to  transfuse to see if this improves her symptoms and functionality -Transfusion initiated by ER physician, but since the patient is transferring to [facility] and she seems unlikely to be able to complete the transfusion prior to transfer and then would have to be re-type and screened there, will hold transfer and initiate after arrival at accepting facility -Recheck CBC post-transfusion -Heme positive so no Lovenox, but low suspicion for GI bleed at this time -She has been taking high-dose  Motrin for pain and so could have gastric irritation; will discontinue  Hypokalemia -K+ 2.5 -Given 70mEq IV KCl in the ER -Given an additional 40 mEq KCl PO -Will give 100 cc/hr NS with 40 mEq KCl/Liter -Recheck K+ in AM  Hyperglycemia -Glucose 115 -Possibly reactive -Will follow with AM labs  HTN -Continue Norvasc   DVT prophylaxis:  SCDs Code Status:  Full - confirmed with patient/family Family Communication: Husband, daughter, and other family members present throughout evaluation  Disposition Plan:  Home once clinically improved Consults called: Will need oncology in AM  Admission status: Admit - It is my clinical opinion that admission to INPATIENT is reasonable and necessary because this patient will require at least 2 midnights in the hospital to treat this condition based on the medical complexity of the problems presented.  Given the aforementioned information, the predictability of an adverse outcome is felt to be significant.    Karmen Bongo MD [facility]  If 7PM-7AM, please contact night-coverage [email or web address] Password [facility]  07/14/2016, 10:21 PM

## 2016-07-14 NOTE — ED Notes (Signed)
Pt returned from xray

## 2016-07-14 NOTE — ED Notes (Signed)
Patient assisted to restroom. Patient returned to room.

## 2016-07-14 NOTE — ED Triage Notes (Signed)
Pt reports feeling weak since this morning and desires to be checked out  Dr Sherrie Sport is PCP

## 2016-07-14 NOTE — ED Notes (Signed)
Pt attempted to obtain a urine sample without success. Will try again after IV fluid bolus.

## 2016-07-14 NOTE — ED Notes (Signed)
Pt transported to xray 

## 2016-07-14 NOTE — ED Notes (Signed)
Pt returned from CT °

## 2016-07-15 ENCOUNTER — Encounter (HOSPITAL_COMMUNITY): Payer: Self-pay | Admitting: *Deleted

## 2016-07-15 DIAGNOSIS — C3490 Malignant neoplasm of unspecified part of unspecified bronchus or lung: Secondary | ICD-10-CM

## 2016-07-15 DIAGNOSIS — R509 Fever, unspecified: Secondary | ICD-10-CM

## 2016-07-15 DIAGNOSIS — D509 Iron deficiency anemia, unspecified: Secondary | ICD-10-CM

## 2016-07-15 DIAGNOSIS — C7951 Secondary malignant neoplasm of bone: Secondary | ICD-10-CM

## 2016-07-15 DIAGNOSIS — G893 Neoplasm related pain (acute) (chronic): Secondary | ICD-10-CM

## 2016-07-15 DIAGNOSIS — R64 Cachexia: Secondary | ICD-10-CM

## 2016-07-15 DIAGNOSIS — R630 Anorexia: Secondary | ICD-10-CM

## 2016-07-15 DIAGNOSIS — R531 Weakness: Secondary | ICD-10-CM

## 2016-07-15 DIAGNOSIS — Z515 Encounter for palliative care: Secondary | ICD-10-CM

## 2016-07-15 DIAGNOSIS — M25552 Pain in left hip: Secondary | ICD-10-CM

## 2016-07-15 LAB — TYPE AND SCREEN
ABO/RH(D): A POS
ANTIBODY SCREEN: NEGATIVE
Unit division: 0
Unit division: 0

## 2016-07-15 LAB — CBC
HCT: 31.9 % — ABNORMAL LOW (ref 36.0–46.0)
Hemoglobin: 10.6 g/dL — ABNORMAL LOW (ref 12.0–15.0)
MCH: 25.5 pg — ABNORMAL LOW (ref 26.0–34.0)
MCHC: 33.2 g/dL (ref 30.0–36.0)
MCV: 76.7 fL — AB (ref 78.0–100.0)
Platelets: 660 10*3/uL — ABNORMAL HIGH (ref 150–400)
RBC: 4.16 MIL/uL (ref 3.87–5.11)
RDW: 15.4 % (ref 11.5–15.5)
WBC: 29.1 10*3/uL — AB (ref 4.0–10.5)

## 2016-07-15 LAB — BPAM RBC
BLOOD PRODUCT EXPIRATION DATE: 201806122359
Blood Product Expiration Date: 201806122359
UNIT TYPE AND RH: 6200
Unit Type and Rh: 6200

## 2016-07-15 LAB — BASIC METABOLIC PANEL
Anion gap: 10 (ref 5–15)
BUN: 7 mg/dL (ref 6–20)
CALCIUM: 8.5 mg/dL — AB (ref 8.9–10.3)
CHLORIDE: 104 mmol/L (ref 101–111)
CO2: 25 mmol/L (ref 22–32)
Creatinine, Ser: 0.67 mg/dL (ref 0.44–1.00)
Glucose, Bld: 120 mg/dL — ABNORMAL HIGH (ref 65–99)
Potassium: 2.8 mmol/L — ABNORMAL LOW (ref 3.5–5.1)
SODIUM: 139 mmol/L (ref 135–145)

## 2016-07-15 LAB — ABO/RH: ABO/RH(D): A POS

## 2016-07-15 MED ORDER — PIPERACILLIN-TAZOBACTAM 3.375 G IVPB
3.3750 g | Freq: Three times a day (TID) | INTRAVENOUS | Status: DC
  Administered 2016-07-15 – 2016-07-17 (×7): 3.375 g via INTRAVENOUS
  Filled 2016-07-15 (×7): qty 50

## 2016-07-15 MED ORDER — DEXAMETHASONE 4 MG PO TABS
2.0000 mg | ORAL_TABLET | Freq: Two times a day (BID) | ORAL | Status: DC
  Administered 2016-07-15 – 2016-07-16 (×4): 2 mg via ORAL
  Filled 2016-07-15 (×4): qty 1

## 2016-07-15 MED ORDER — POLYETHYLENE GLYCOL 3350 17 G PO PACK
17.0000 g | PACK | Freq: Every day | ORAL | Status: DC
  Administered 2016-07-16 – 2016-07-25 (×5): 17 g via ORAL
  Filled 2016-07-15 (×6): qty 1

## 2016-07-15 MED ORDER — HYDROCODONE-ACETAMINOPHEN 5-325 MG PO TABS
1.0000 | ORAL_TABLET | ORAL | Status: DC | PRN
  Filled 2016-07-15: qty 1

## 2016-07-15 MED ORDER — MORPHINE SULFATE ER 15 MG PO TBCR
15.0000 mg | EXTENDED_RELEASE_TABLET | Freq: Two times a day (BID) | ORAL | Status: DC
  Administered 2016-07-15 – 2016-07-25 (×21): 15 mg via ORAL
  Filled 2016-07-15 (×21): qty 1

## 2016-07-15 MED ORDER — POTASSIUM CHLORIDE CRYS ER 20 MEQ PO TBCR
40.0000 meq | EXTENDED_RELEASE_TABLET | Freq: Once | ORAL | Status: AC
  Administered 2016-07-15: 40 meq via ORAL
  Filled 2016-07-15: qty 2

## 2016-07-15 NOTE — Consult Note (Signed)
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 female is admitted to the hospital after severe, uncontrolled left hip pain, anorexia with weight loss. This patient has significant smoking history, quit several months ago. She has been complaining of severe anorexia, more than 20 pound weight loss and severe uncontrolled left hip pain for several months. She had chest x-ray and x-ray of her left hip in March 2018 that came back unremarkable. On 06/16/2016, she underwent MRI of her back which show degenerative joint disease. Orthopedic consultation was pending. She was prescribed ibuprofen and muscle relaxant without significant improvement of her pain control. In the meantime, she has been having anorexia, weight loss and occasional night sweats. She came to the emergency department again and repeat blood work shows significant leukocytosis, microcytic anemia with high platelet count. Her repeat chest x-ray came back abnormal, leading to CT imaging study. CT imaging study showed a large right middle lobe mass and mediastinal lymphadenopathy with near-complete right middle lobe collapse. There is also significant soft tissue destruction involving the left pelvis. She is being admitted to expedite workup and for further pain management. At present time, she complained of 10 out of 10 pain over the left hip area. She denies recent significant cough or hemoptysis. Appetite is poor. She had mild recent constipation. No nausea. She denies headache or new neurological deficits.  MEDICAL HISTORY:  Past Medical History:  Diagnosis Date  . Hypertension   . Spinal stenosis     SURGICAL HISTORY: Past Surgical History:  Procedure Laterality Date   . TUBAL LIGATION      SOCIAL HISTORY: Social History   Social History  . Marital status: Legally Separated    Spouse name: N/A  . Number of children: 31  . Years of education: N/A   Occupational History  . Unemployed    Social History Main Topics  . Smoking status: Former Smoker    Packs/day: 0.50    Years: 40.00    Types: Cigarettes    Quit date: 10/2015  . Smokeless tobacco: Never Used  . Alcohol use No  . Drug use: No  . Sexual activity: Yes   Other Topics Concern  . Not on file   Social History Narrative  . No narrative on file    FAMILY HISTORY: Family History  Problem Relation Age of Onset  . Hypertension Mother   . Diabetes Mother   . Breast cancer Mother   . Hypertension Father   . Hypertension Sister   . Pancreatic cancer Sister   . Lung cancer Maternal Grandmother     ALLERGIES:  is allergic to iron.  MEDICATIONS:  Current Facility-Administered Medications  Medication Dose Route Frequency Provider Last Rate Last Dose  . 0.9 %  sodium chloride infusion   Intravenous Once Karmen Bongo, MD      . 0.9 % NaCl with KCl 40 mEq / L  infusion   Intravenous Continuous Karmen Bongo, MD 100 mL/hr at 07/15/16 0019 100 mL/hr at 07/15/16 0019  . acetaminophen (TYLENOL) tablet 650 mg  650 mg Oral Q6H PRN Karmen Bongo, MD   650 mg at 07/15/16 0403   Or  . acetaminophen (TYLENOL) suppository 650 mg  650 mg Rectal Q6H PRN Karmen Bongo, MD      .  amLODipine (NORVASC) tablet 10 mg  10 mg Oral Ivery Quale, MD   10 mg at 07/15/16 0018  . dexamethasone (DECADRON) tablet 2 mg  2 mg Oral Q12H Cruz Bong, MD      . docusate sodium (COLACE) capsule 100 mg  100 mg Oral BID Karmen Bongo, MD   100 mg at 07/15/16 0350  . gabapentin (NEURONTIN) capsule 300 mg  300 mg Oral BID Karmen Bongo, MD   300 mg at 07/15/16 0938  . HYDROcodone-acetaminophen (NORCO/VICODIN) 5-325 MG per tablet 1 tablet  1 tablet Oral Q4H PRN Caren Griffins, MD      .  methocarbamol (ROBAXIN) 500 mg in dextrose 5 % 50 mL IVPB  500 mg Intravenous Q6H PRN Karmen Bongo, MD 110 mL/hr at 07/15/16 0019 500 mg at 07/15/16 0019  . morphine (MS CONTIN) 12 hr tablet 15 mg  15 mg Oral Q12H Ayona Yniguez, MD      . morphine 2 MG/ML injection 2 mg  2 mg Intravenous Q2H PRN Karmen Bongo, MD   2 mg at 07/15/16 1111  . ondansetron (ZOFRAN) tablet 4 mg  4 mg Oral Q6H PRN Karmen Bongo, MD   4 mg at 07/15/16 0453   Or  . ondansetron [employer]) injection 4 mg  4 mg Intravenous Q6H PRN Karmen Bongo, MD      . piperacillin-tazobactam (ZOSYN) IVPB 3.375 g  3.375 g Intravenous Q8H Adrian Saran, RPH 12.5 mL/hr at 07/15/16 0921 3.375 g at 07/15/16 0921  . pravastatin (PRAVACHOL) tablet 20 mg  20 mg Oral q1800 Karmen Bongo, MD        REVIEW OF SYSTEMS:   Constitutional: Denies fevers, chills or abnormal night sweats Eyes: Denies blurriness of vision, double vision or watery eyes Ears, nose, mouth, throat, and face: Denies mucositis or sore throat Respiratory: Denies cough, dyspnea or wheezes Cardiovascular: Denies palpitation, chest discomfort or lower extremity swelling Skin: Denies abnormal skin rashes Lymphatics: Denies new lymphadenopathy or easy bruising Behavioral/Psych: Mood is stable, no new changes  All other systems were reviewed with the patient and are negative.  PHYSICAL EXAMINATION: ECOG PERFORMANCE STATUS: 2 - Symptomatic, <50% confined to bed  Vitals:   07/15/16 0701 07/15/16 0920  BP: (!) 121/56 138/64  Pulse: 98 (!) 101  Resp: 18 18  Temp: 99.1 F (37.3 C) 99.2 F (37.3 C)   Filed Weights   07/14/16 1553  Weight: 153 lb (69.4 kg)    GENERAL:alert, no distress and comfortable. She looks thin and mildly cachectic SKIN: skin color, texture, turgor are normal, no rashes or significant lesions EYES: normal, conjunctiva are pale and non-injected, sclera clear OROPHARYNX:no exudate, no erythema and lips, buccal mucosa, and tongue normal   NECK: supple, thyroid normal size, non-tender, without nodularity LYMPH:  no palpable lymphadenopathy in the cervical, axillary or inguinal LUNGS: clear to auscultation and percussion with normal breathing effort HEART: regular rate & rhythm and no murmurs and no lower extremity edema ABDOMEN:abdomen soft, non-tender and normal bowel sounds Musculoskeletal:no cyanosis of digits and no clubbing . Significant pain over the left pelvis region PSYCH: alert & oriented x 3 with fluent speech NEURO: no focal motor/sensory deficits  LABORATORY DATA:  I have reviewed the data as listed Lab Results  Component Value Date   WBC 22.4 (H) 07/14/2016   HGB 7.9 (L) 07/14/2016   HCT 25.9 (L) 07/14/2016   MCV 79.0 07/14/2016   PLT 743 (H) 07/14/2016    Recent Labs  04/15/16 0429  07/14/16 1612  NA 143 142  K 3.5 2.5*  CL 112* 104  CO2 23 25  GLUCOSE 111* 115*  BUN 10 12  CREATININE 0.73 0.82  CALCIUM 8.7* 9.1  GFRNONAA >60 >60  GFRAA >60 >60  PROT 7.5 8.1  ALBUMIN 3.3* 2.5*  AST 24 14*  ALT 17 11*  ALKPHOS 75 174*  BILITOT 0.5 0.6    RADIOGRAPHIC STUDIES: I have personally reviewed the radiological images as listed and agreed with the findings in the report. Dg Chest 2 View  Result Date: 07/14/2016 CLINICAL DATA:  Fatigue EXAM: CHEST  2 VIEW COMPARISON:  04/15/2016 FINDINGS: Cardiac shadow is within normal limits. Lungs are well aerated bilaterally. Right middle lobe infiltrate with volume loss is noted. Additionally there is a a rounded masslike density in the lateral aspect of the right measuring approximately 3 cm. Considerable right peritracheal and right hilar adenopathy is noted. These changes are highly suspicious for underlying neoplasm. Further evaluation by means of CT of the chest with contrast is recommended. No bony abnormality is seen. IMPRESSION: Changes consistent with a right middle lobe mass and central hilar and mediastinal adenopathy with partial right middle lobe  collapse. This is consistent with neoplasm and CT evaluation is recommended. Electronically Signed   By: Inez Catalina M.D.   On: 07/14/2016 16:33   Ct Chest W Contrast  Result Date: 07/14/2016 CLINICAL DATA:  Right lung mass and right middle lobe collapse EXAM: CT CHEST, ABDOMEN, AND PELVIS WITH CONTRAST TECHNIQUE: Multidetector CT imaging of the chest, abdomen and pelvis was performed following the standard protocol during bolus administration of intravenous contrast. CONTRAST:  1100mL ISOVUE-300 IOPAMIDOL (ISOVUE-300) INJECTION 61% COMPARISON:  Chest x-ray from earlier in the same day. FINDINGS: CT CHEST FINDINGS Cardiovascular: The thoracic aorta shows a bovine it branching pattern. No aneurysmal dilatation or dissection is seen. No cardiac enlargement is noted. The pulmonary artery as visualized is within normal limits with the exception of attenuation of branches on the right secondary to hilar and mediastinal adenopathy. Mediastinum/Nodes: Thoracic inlet is within normal limits. Multiple mediastinal mobile masses are seen. One is noted in the anterior mediastinum measuring 5.8 x 2.9 cm. A second is noted anterior to the carina measuring at least 5.1 by 5.8 cm. This mildly displaces the superior vena cava and causes mass effect upon the right main pulmonary artery. Subcarinal nodal mass is also noted measuring 4.5 by 3.4 cm in greatest dimension. Smaller right hilar nodes are seen. The largest of these measures 26 mm in short axis. The esophagus as visualize is within normal limits. Lungs/Pleura: The left lung demonstrates some emphysematous changes. No focal infiltrate or sizable effusion is seen. There is a 1.8 x 1.0 soft tissue density along the posteromedial aspect of the left lung. This appears to be within the chest wall as opposed to within the lung itself. The right lung also demonstrates significant emphysematous changes. There is a mass lesion which measures 2.8 by 3.0 cm in greatest transverse and  AP dimension within the right middle lobe as seen on previous chest x-ray. Near complete collapse of the right middle lobe is seen secondary to central bronchial occlusion. Musculoskeletal: Degenerative changes of the thoracic spine are noted. No definitive lytic or sclerotic lesions are seen. CT ABDOMEN PELVIS FINDINGS Hepatobiliary: The liver demonstrates a focal hyperdense lesion likely representing a small hemangioma. The gallbladder is within normal limits. Pancreas: Pancreas is within normal limits. Spleen: Normal in size without focal abnormality. Adrenals/Urinary Tract: The  adrenal glands are unremarkable. The kidneys demonstrate normal enhancement bilaterally. Renal cyst is noted on the left. A few small nonobstructing calculi are noted in the right kidney. Mild scarring in the right kidney is noted as well. The bladder is partially distended. Stomach/Bowel: No obstructive or inflammatory changes are seen. The appendix is within normal limits. Vascular/Lymphatic: Aortic atherosclerosis. No enlarged abdominal or pelvic lymph nodes. Reproductive: The uterus is within normal limits and somewhat displaced to the right. Ovarian cystic changes are seen bilaterally. Other: No abdominal wall hernia or abnormality. No abdominopelvic ascites. Musculoskeletal: In the left ischial tuberosity and extending superiorly into the posterior aspect of the left acetabulum, there is a lytic lesion with significant soft tissue component. It nearly completely destroyed is the ischial tuberosity and posterior elements of the right acetabulum. The soft tissue component extends inferiorly along the obturator foramen and measures approximately 12 by 8.6 cm in greatest dimension. It also extends superiorly along the left pelvic wall. IMPRESSION: Right middle lobe mass lesion with associated hilar and mediastinal adenopathy and near complete right middle lobe collapse. This is consistent with a primary pulmonary neoplasm with  metastatic lesions till proven otherwise. PET-CT and tissue sampling are recommended for further evaluation. Small soft tissue nodule along the posterior costophrenic angle medially on the left. This may represent a small metastatic focus. Bony metastatic disease involving the ischial tuberosity on the left with large significant surrounding soft tissue component consistent with metastatic disease. Electronically Signed   By: Inez Catalina M.D.   On: 07/14/2016 18:10   Mr Lumbar Spine Wo Contrast  Result Date: 06/16/2016 CLINICAL DATA:  56 year old female with 2 months of lumbar back pain radiating to the left leg. No known injury. EXAM: MRI LUMBAR SPINE WITHOUT CONTRAST TECHNIQUE: Multiplanar, multisequence MR imaging of the lumbar spine was performed. No intravenous contrast was administered. COMPARISON:  [facility] radiographs 07/03/2013. FINDINGS: Segmentation: Lumbar segmentation appears to be normal and will be designated as such for this report. Alignment: Subtle anterolisthesis of L4 on L5. Otherwise within normal limits. Vertebrae: Degenerative appearing anterior endplate marrow edema at T12-L1. Underlying mildly to moderately heterogeneous bone marrow signal but no other marrow edema or acute osseous abnormality. Visible sacrum is intact. Conus medullaris: Extends to the L1-L2 level and appears normal. Paraspinal and other soft tissues: Small simple left lower pole renal cyst. Visualized abdominal viscera and paraspinal soft tissues are within normal limits. Disc levels: T11-T12:  Negative. T12-L1: Disc space loss and right eccentric circumferential disc bulge. Mild spinal stenosis and right lateral recess stenosis (descending right L1 nerve root level series 5, image 3). Mild right T12 foraminal stenosis. L1-L2:  Mild circumferential disc bulge.  No stenosis. L2-L3: Mild far lateral disc bulging. Mild facet hypertrophy. No significant stenosis. L3-L4: Mild far lateral disc bulging. Mild facet and ligament  flavum hypertrophy. No spinal or lateral recess stenosis. Borderline to mild bilateral L3 foraminal stenosis. L4-L5: Subtle anterolisthesis. Circumferential disc bulge with broad-based posterior component, but superimposed small left subarticular disc protrusion (series 2, image 10 and series 5, image 29). Severe facet and mild to moderate ligament flavum hypertrophy. Facet joint fluid on the right. Mild left greater than right lateral recess stenosis. No significant spinal stenosis. Mild to moderate left and mild right L4 foraminal stenosis. L5-S1: Mild disc bulge, endplate spurring and facet hypertrophy. No spinal or lateral recess stenosis. Mild bilateral L5 foraminal stenosis. IMPRESSION: 1. Symptomatic level favored to be L4-L5 where subtle anterolisthesis is associated with disc and severe facet  degeneration. There is a small left subarticular disc herniation contributing to mild to moderate left foraminal stenosis and mild left lateral recess stenosis. Query left L4 and/or L5 radiculitis, respectively. 2. Mild spinal and right lateral recess stenosis at T12-L1. No mass effect on the lower thoracic spinal cord at that level. 3. Otherwise no significant lumbar spinal stenosis. Disc bulging and facet degeneration elsewhere. Mild bilateral L3 and L5 neural foraminal stenosis. Electronically Signed   By: Genevie Ann M.D.   On: 06/16/2016 14:34   Ct Abdomen Pelvis W Contrast  Result Date: 07/14/2016 CLINICAL DATA:  Right lung mass and right middle lobe collapse EXAM: CT CHEST, ABDOMEN, AND PELVIS WITH CONTRAST TECHNIQUE: Multidetector CT imaging of the chest, abdomen and pelvis was performed following the standard protocol during bolus administration of intravenous contrast. CONTRAST:  178mL ISOVUE-300 IOPAMIDOL (ISOVUE-300) INJECTION 61% COMPARISON:  Chest x-ray from earlier in the same day. FINDINGS: CT CHEST FINDINGS Cardiovascular: The thoracic aorta shows a bovine it branching pattern. No aneurysmal dilatation  or dissection is seen. No cardiac enlargement is noted. The pulmonary artery as visualized is within normal limits with the exception of attenuation of branches on the right secondary to hilar and mediastinal adenopathy. Mediastinum/Nodes: Thoracic inlet is within normal limits. Multiple mediastinal mobile masses are seen. One is noted in the anterior mediastinum measuring 5.8 x 2.9 cm. A second is noted anterior to the carina measuring at least 5.1 by 5.8 cm. This mildly displaces the superior vena cava and causes mass effect upon the right main pulmonary artery. Subcarinal nodal mass is also noted measuring 4.5 by 3.4 cm in greatest dimension. Smaller right hilar nodes are seen. The largest of these measures 26 mm in short axis. The esophagus as visualize is within normal limits. Lungs/Pleura: The left lung demonstrates some emphysematous changes. No focal infiltrate or sizable effusion is seen. There is a 1.8 x 1.0 soft tissue density along the posteromedial aspect of the left lung. This appears to be within the chest wall as opposed to within the lung itself. The right lung also demonstrates significant emphysematous changes. There is a mass lesion which measures 2.8 by 3.0 cm in greatest transverse and AP dimension within the right middle lobe as seen on previous chest x-ray. Near complete collapse of the right middle lobe is seen secondary to central bronchial occlusion. Musculoskeletal: Degenerative changes of the thoracic spine are noted. No definitive lytic or sclerotic lesions are seen. CT ABDOMEN PELVIS FINDINGS Hepatobiliary: The liver demonstrates a focal hyperdense lesion likely representing a small hemangioma. The gallbladder is within normal limits. Pancreas: Pancreas is within normal limits. Spleen: Normal in size without focal abnormality. Adrenals/Urinary Tract: The adrenal glands are unremarkable. The kidneys demonstrate normal enhancement bilaterally. Renal cyst is noted on the left. A few small  nonobstructing calculi are noted in the right kidney. Mild scarring in the right kidney is noted as well. The bladder is partially distended. Stomach/Bowel: No obstructive or inflammatory changes are seen. The appendix is within normal limits. Vascular/Lymphatic: Aortic atherosclerosis. No enlarged abdominal or pelvic lymph nodes. Reproductive: The uterus is within normal limits and somewhat displaced to the right. Ovarian cystic changes are seen bilaterally. Other: No abdominal wall hernia or abnormality. No abdominopelvic ascites. Musculoskeletal: In the left ischial tuberosity and extending superiorly into the posterior aspect of the left acetabulum, there is a lytic lesion with significant soft tissue component. It nearly completely destroyed is the ischial tuberosity and posterior elements of the right acetabulum. The soft  tissue component extends inferiorly along the obturator foramen and measures approximately 12 by 8.6 cm in greatest dimension. It also extends superiorly along the left pelvic wall. IMPRESSION: Right middle lobe mass lesion with associated hilar and mediastinal adenopathy and near complete right middle lobe collapse. This is consistent with a primary pulmonary neoplasm with metastatic lesions till proven otherwise. PET-CT and tissue sampling are recommended for further evaluation. Small soft tissue nodule along the posterior costophrenic angle medially on the left. This may represent a small metastatic focus. Bony metastatic disease involving the ischial tuberosity on the left with large significant surrounding soft tissue component consistent with metastatic disease. Electronically Signed   By: Inez Catalina M.D.   On: 07/14/2016 18:10    ASSESSMENT & PLAN:  Lung masses, significant bone lesion, metastatic lung cancer until proven otherwise I recommend MRI of the brain to complete staging I recommend interventional radiologists for biopsy I recommend radiation oncology consultation for  palliative radiation treatments, both to alleviate postobstructive pneumonitis as well as palliative control of her bone pain in the pelvis I recommend palliative care consult to assist in pain management I will consult physical therapy to assess in gait assessment and safety  Severe cancer pain I will start her on MS Contin along with morphine as needed We'll consult palliative care to assist in pain management I will start her on low-dose dexamethasone for pain control  Severe anorexia, malignant cachexia Hopefully, low-dose dexamethasone will help with appetite She went a dietitian consult for nutritional supplement  Severe microcytic anemia She has received blood transfusion Likely anemia of chronic disease but I will check iron studies  Discharge planning She is too ill to be discharged. She will likely be here until the end of the week  All questions were answered. The patient knows to call the clinic with any problems, questions or concerns.    Heath Lark, MD 07/15/2016 12:00 PM

## 2016-07-15 NOTE — Progress Notes (Signed)
Pharmacy Antibiotic Note  Heather Hinton is a 56 y.o. female admitted on 07/14/2016 with post obstructive PNA.  Pharmacy has been consulted for Zosyn dosing.  Plan: Zosyn 3.375g IV q8h (4 hour infusion).  Height: 5\' 7"  (170.2 cm) Weight: 153 lb (69.4 kg) IBW/kg (Calculated) : 61.6  Temp (24hrs), Avg:99.9 F (37.7 C), Min:99 F (37.2 C), Max:101.1 F (38.4 C)   Recent Labs Lab 07/14/16 1612  WBC 22.4*  CREATININE 0.82    Estimated Creatinine Clearance: 74.5 mL/min (by C-G formula based on SCr of 0.82 mg/dL).    Allergies  Allergen Reactions  . Iron Nausea And Vomiting    Thank you for allowing pharmacy to be a part of this patient's care.   Adrian Saran, PharmD, BCPS Pager [phone] 07/15/2016 7:46 AM

## 2016-07-15 NOTE — Consult Note (Signed)
Consultation Note Date: 07/15/2016   Patient Name: Heather Hinton  DOB: November 07, 1960  MRN: [id number]  Age / Sex: 56 y.o., female  PCP: Neale Burly, MD Referring Physician: Caren Griffins, MD  Reason for Consultation: Pain control and Psychosocial/spiritual support  HPI/Patient Profile: 56 y.o. female  with past medical history of Hypertension, left hip pain with associated weakness, fevers, intermittent night sweats and anorexia admitted on 07/14/2016 with worsening left hip pain. Patient began experiencing left hip pain several months back. She had an x-ray of her left hip as well as chest x-ray in March 2018 which came back unremarkable. On 06/16/2016 she underwent an MRI of her back which showed degenerative joint disease. Orthopedic consult was pending at the time of her admission to [facility]. She has tried treating this with ibuprofen and muscle relaxants without  improvement. Additionally she has had loss of appetite with reported 20 pound weight loss. Upon presentation to the emergency department she had significant leukocytosis, microcytic anemia, and thrombocytosis. CT scan of the chest showed a large right middle lobe mass with associated mediastinal lymphadenopathy, near-complete right middle lobe collapse. Further workup revealed lytic lesion to left hip .   Clinical Assessment and Goals of Care: Focus today was on pain management versus goals of care. This is a very new diagnosis for patient and her family. Introduce palliative medicine team is part of her overall care team, resource as well as source of support.  Patient describes her pain is continuous, throbbing, aching. She is unable to lie on her left side and bearing weight on her left leg feels weak as if her leg, buccal and worsened her pain. Her pain was a 10 of 10 upon admission. She has been started on MS Contin 15mg  twice daily as well as  Decadron 2 mg twice daily; she has received 1 dose of each thus far. She rates her pain  lying in bed with her left hip up as a 6 out of 10. She is denying dyspnea. She has felt nauseated but this was 07/14/16. Marland Kitchen She has not had a worsening of nausea since starting oral morphine . Patient is scheduled for CT-guided biopsy on 07/16/2016  Patient at this point is decisional. Would recommend in the near future patient designated eating a healthcare power of attorney    SUMMARY OF RECOMMENDATIONS   Patient is verbalizing relief with current medication regimen of extended release morphine and low-dose Decadron. Discussed at length importance of not letting her pain increased to an 8 out of 10 and to ask for breakthrough medicine. Reviewed her medication list and what her options are for  treatment in terms of breakthrough pain, nausea Once patient has had biopsy performed ,higher doses of steroids could be a good option for addressing bony pain Palliative medicine to round on patient on 07/16/2016 to assess her pain control Code Status/Advance Care Planning:  Full code    Symptom Management:   Pain: Continue morphine oral extended release 15 mg twice daily and morphine IV  2 mg every 2 hours as needed. Continue Neurontin 300 mg twice daily; Vicodin 1 tablet every 4 hours as needed, Decadron 2 mg every 12 hours  Constipation: We'll add MiraLAX 17 g daily to avoid opioid-induced constipation  Palliative Prophylaxis:   Bowel Regimen and Frequent Pain Assessment  Additional Recommendations (Limitations, Scope, Preferences):  Full Scope Treatment  Psycho-social/Spiritual:   Desire for further Chaplaincy support:no  Additional Recommendations: Grief/Bereavement Support  Prognosis:   Unable to determine  Discharge Planning: To Be Determined      Primary Diagnoses: Present on Admission: . Metastatic lung cancer (metastasis from lung to other site) [facility]) . Essential hypertension .  Hypokalemia . Hyperglycemia . Symptomatic anemia . Thrombocytosis ([place])   I have reviewed the medical record, interviewed the patient and family, and examined the patient. The following aspects are pertinent.  Past Medical History:  Diagnosis Date  . Hypertension   . Spinal stenosis    Social History   Social History  . Marital status: Legally Separated    Spouse name: N/A  . Number of children: 49  . Years of education: N/A   Occupational History  . Unemployed    Social History Main Topics  . Smoking status: Former Smoker    Packs/day: 0.50    Years: 40.00    Types: Cigarettes    Quit date: 10/2015  . Smokeless tobacco: Never Used  . Alcohol use No  . Drug use: No  . Sexual activity: Yes   Other Topics Concern  . None   Social History Narrative  . None   Family History  Problem Relation Age of Onset  . Hypertension Mother   . Diabetes Mother   . Breast cancer Mother   . Hypertension Father   . Hypertension Sister   . Pancreatic cancer Sister   . Lung cancer Maternal Grandmother    Scheduled Meds: . amLODipine  10 mg Oral QHS  . dexamethasone  2 mg Oral Q12H  . docusate sodium  100 mg Oral BID  . gabapentin  300 mg Oral BID  . morphine  15 mg Oral Q12H  . pravastatin  20 mg Oral q1800   Continuous Infusions: . sodium chloride    . 0.9 % NaCl with KCl 40 mEq / L 100 mL/hr (07/15/16 0019)  . methocarbamol (ROBAXIN)  IV 500 mg (07/15/16 0019)  . piperacillin-tazobactam (ZOSYN)  IV 3.375 g (07/15/16 1719)   PRN Meds:.acetaminophen **OR** acetaminophen, HYDROcodone-acetaminophen, methocarbamol (ROBAXIN)  IV, morphine injection, ondansetron **OR** ondansetron (ZOFRAN) IV Medications Prior to Admission:  Prior to Admission medications   Medication Sig Start Date End Date Taking? Authorizing Provider  amLODipine (NORVASC) 10 MG tablet Take 10 mg by mouth at bedtime.   Yes [provider]  gabapentin (NEURONTIN) 300 MG capsule Take 300 mg by mouth  2 (two) times daily.   Yes [provider]  lovastatin (MEVACOR) 20 MG tablet Take 20 mg by mouth at bedtime.   Yes [provider]  Multiple Vitamin (MULTIVITAMIN WITH MINERALS) TABS tablet Take 1 tablet by mouth daily.   Yes [provider]   Allergies  Allergen Reactions  . Iron Nausea And Vomiting   Review of Systems  Constitutional: Positive for activity change, appetite change and unexpected weight change.  Eyes: Negative.   Respiratory: Negative.   Cardiovascular: Negative.   Gastrointestinal: Positive for nausea.  Endocrine:       Night sweats  Genitourinary: Negative.   Musculoskeletal:  Severe left hip pain  Skin: Negative.   Allergic/Immunologic: Negative.   Neurological: Positive for weakness.  Hematological: Negative.   Psychiatric/Behavioral: Negative.     Physical Exam  Constitutional: She is oriented to person, place, and time. She appears well-developed and well-nourished.  HENT:  Head: Normocephalic and atraumatic.  Neck: Normal range of motion.  Cardiovascular: Normal rate and regular rhythm.   Pulmonary/Chest: Effort normal.  Abdominal: Soft.  Musculoskeletal:  Lying with left hip up 2/2 pain Weakness to left leg  Neurological: She is alert and oriented to person, place, and time.  Skin: Skin is warm and dry.  Psychiatric: She has a normal mood and affect. Her behavior is normal. Judgment and thought content normal.  Nursing note and vitals reviewed.   Vital Signs: BP 129/88 (BP Location: Left Arm)   Pulse (!) 114   Temp (!) 100.5 F (38.1 C) (Oral)   Resp 18   Ht 5\' 7"  (1.702 m)   Wt 69.4 kg (153 lb)   SpO2 98%   BMI 23.96 kg/m  Pain Assessment: 0-10   Pain Score: 5    SpO2: SpO2: 98 % O2 Device:SpO2: 98 % O2 Flow Rate: .   IO: Intake/output summary:  Intake/Output Summary (Last 24 hours) at 07/15/16 1728 Last data filed at 07/15/16 1253  Gross per 24 hour  Intake           3422.5 ml  Output               500 ml  Net           2922.5 ml    LBM: Last BM Date: 07/15/16 Baseline Weight: Weight: 69.4 kg (153 lb) Most recent weight: Weight: 69.4 kg (153 lb)     Palliative Assessment/Data:   Flowsheet Rows     Most Recent Value  Intake Tab  Referral Department  Hospitalist  Unit at Time of Referral  Oncology Unit  Palliative Care Primary Diagnosis  Cancer  Date Notified  07/15/16  Palliative Care Type  New Palliative care  Reason for referral  Pain  Date of Admission  07/14/16  Date first seen by Palliative Care  07/15/16  # of days Palliative referral response time  0 Day(s)  # of days IP prior to Palliative referral  1  Clinical Assessment  Palliative Performance Scale Score  50%  Pain Max last 24 hours  10  Pain Min Last 24 hours  6  Dyspnea Max Last 24 Hours  0  Dyspnea Min Last 24 hours  0  Nausea Max Last 24 Hours  2  Nausea Min Last 24 Hours  0  Anxiety Max Last 24 Hours  0  Anxiety Min Last 24 Hours  0  Other Max Last 24 Hours  0  Psychosocial & Spiritual Assessment  Palliative Care Outcomes  Patient/Family meeting held?  Yes  Who was at the meeting?  pt and multiple family members  Palliative Care Outcomes  Improved pain interventions  Palliative Care follow-up planned  Yes, Facility      Time In: 1630 Time Out: 1740 Time Total: 70 min Greater than 50%  of this time was spent counseling and coordinating care related to the above assessment and plan. Staffed with Dr. Alvy Bimler and Dr. Rowe Pavy  Signed by: Dory Horn, NP   Please contact Palliative Medicine Team phone at [phone] for questions and concerns.  For individual provider: See Shea Evans

## 2016-07-15 NOTE — Progress Notes (Signed)
Pt alert x4, [place] from [facility] ED, family at the bedside, order placed for 2 units of blood, called the blood bank blood is not ready at the monment. The blood bank will call me, when the blood is available. I will continue to monitor.

## 2016-07-15 NOTE — Progress Notes (Signed)
PROGRESS NOTE  Lyndsay Talamante LSL:[id number] DOB: [birth year] DOA: 07/14/2016 PCP: Neale Burly, MD   LOS: 1 day   Brief Narrative / Interim history: 56 y.o. female with medical history significant of HTN and recent (3 months) h/o spinal stenosis presenting with weakness, left hip pain as well as subjective chills and fevers with cough.  Symptoms have been progressing  Assessment & Plan: Principal Problem:   Metastatic lung cancer (metastasis from lung to other site) [facility]) Active Problems:   Essential hypertension   Hypokalemia   Hyperglycemia   Symptomatic anemia   Thrombocytosis (HCC)   Probable metastatic lung cancer -Patient with new diagnosis of lung mass -Consulted oncology, discussed with Dr. Alvy Bimler -We will need tissue sample, have asked interventional radiology to review images and try to go for the safest approach -She has lymphadenopathy as well as bony metastatic disease on the left ischial tuberosity so by definition is stage IV  Postobstructive pneumonia -She is near complete right middle lobe collapse, she has been febrile in the hospital and has a cough -Started IV antibiotics coverage  Hypertension -Continue home Norvasc, blood pressure is stable  Hyperlipidemia -Continue home statin  Normocytic anemia -Perhaps due to underlying malignancy, recheck a CBC in the morning  Tobacco abuse, 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  Very emotional regarding all this  Objective: Vitals:   07/15/16 0630 07/15/16 0649 07/15/16 0701 07/15/16 0920  BP: (!) 129/55 (!) 127/56 (!) 121/56 138/64  Pulse:  100 99 98 (!) 101  Resp: 16 16 18 18   Temp: 99.6 F (37.6 C) 99 F (37.2 C) 99.1 F (37.3 C) 99.2 F (37.3 C)  TempSrc: Oral Oral Oral Oral  SpO2: 98% 99% 99% 98%  Weight:      Height:        Intake/Output Summary (Last 24 hours) at 07/15/16 1131 Last data filed at 07/15/16 0920  Gross per 24 hour  Intake           3302.5 ml  Output              250 ml  Net           3052.5 ml   Filed Weights   07/14/16 1553  Weight: 69.4 kg (153 lb)    Examination:  Vitals:   07/15/16 0630 07/15/16 0649 07/15/16 0701 07/15/16 0920  BP: (!) 129/55 (!) 127/56 (!) 121/56 138/64  Pulse: 100 99 98 (!) 101  Resp: 16 16 18 18   Temp: 99.6 F (37.6 C) 99 F (37.2 C) 99.1 F (37.3 C) 99.2 F (37.3 C)  TempSrc: Oral Oral Oral Oral  SpO2: 98% 99% 99% 98%  Weight:      Height:        Constitutional: NAD Eyes: PERRL, lids and conjunctivae normal ENMT: Mucous membranes are moist. No oropharyngeal exudates. Poor dentition Respiratory: mild rhonchi on right base, no wheezing, no crackles. Normal respiratory effort. No accessory muscle use.  Cardiovascular: Regular rate and rhythm, no murmurs / rubs / gallops. No LE edema. 2+ pedal pulses. No carotid bruits.  Abdomen: no tenderness. Bowel sounds  positive.  Skin: no rashes, lesions, ulcers. No induration Neurologic: CN 2-12 grossly intact. Strength 5/5 in all 4.    Data Reviewed: I have independently reviewed following labs and imaging studies   CT scan chest, abdomen, and pelvis -Large anterior mediastinal mass, middle lobe mass with middle lobe collapse, also visualized metastatic lesion on the left ischial tuberosity  I have reviewed old records and imaging and images in the past did not show clear evidence of malignancy   CBC:  Recent Labs Lab 07/14/16 1612  WBC 22.4*  NEUTROABS 17.7*  HGB 7.9*  HCT 25.9*  MCV 79.0  PLT 073*   Basic Metabolic Panel:  Recent Labs Lab 07/14/16 1612  NA 142  K 2.5*  CL 104  CO2 25    GLUCOSE 115*  BUN 12  CREATININE 0.82  CALCIUM 9.1  MG 2.1   GFR: Estimated Creatinine Clearance: 74.5 mL/min (by C-G formula based on SCr of 0.82 mg/dL). Liver Function Tests:  Recent Labs Lab 07/14/16 1612  AST 14*  ALT 11*  ALKPHOS 174*  BILITOT 0.6  PROT 8.1  ALBUMIN 2.5*   No results for input(s): LIPASE, AMYLASE in the last 168 hours. No results for input(s): AMMONIA in the last 168 hours. Coagulation Profile: No results for input(s): INR, PROTIME in the last 168 hours. Cardiac Enzymes: No results for input(s): CKTOTAL, CKMB, CKMBINDEX, TROPONINI in the last 168 hours. BNP (last 3 results) No results for input(s): PROBNP in the last 8760 hours. HbA1C: No results for input(s): HGBA1C in the last 72 hours. CBG: No results for input(s): GLUCAP in the last 168 hours. Lipid Profile: No results for input(s): CHOL, HDL, LDLCALC, TRIG, CHOLHDL, LDLDIRECT in the last 72 hours. Thyroid Function Tests: No results for input(s): TSH, T4TOTAL, FREET4, T3FREE, THYROIDAB in the last 72 hours. Anemia Panel: No results for input(s): VITAMINB12, FOLATE, FERRITIN, TIBC, IRON, RETICCTPCT in the last 72 hours. Urine analysis:    Component Value Date/Time   COLORURINE STRAW (A) 07/14/2016 1856   APPEARANCEUR CLEAR 07/14/2016 1856   LABSPEC 1.020 07/14/2016 1856   PHURINE 7.0 07/14/2016 1856   GLUCOSEU NEGATIVE 07/14/2016 1856   HGBUR NEGATIVE 07/14/2016 1856   BILIRUBINUR NEGATIVE 07/14/2016 1856   KETONESUR NEGATIVE 07/14/2016 1856   PROTEINUR NEGATIVE 07/14/2016 1856   NITRITE NEGATIVE 07/14/2016 1856   LEUKOCYTESUR NEGATIVE 07/14/2016 1856   Sepsis Labs: Invalid input(s): PROCALCITONIN, LACTICIDVEN  No results found for this or any previous visit (from the past 240 hour(s)).    Radiology Studies: Dg Chest 2 View  Result Date: 07/14/2016 CLINICAL DATA:  Fatigue EXAM: CHEST  2 VIEW COMPARISON:  04/15/2016 FINDINGS: Cardiac shadow is within normal limits. Lungs are well  aerated bilaterally. Right middle lobe infiltrate with volume loss is noted. Additionally there is a a rounded masslike density in the lateral aspect of the right measuring approximately 3 cm. Considerable right peritracheal and right hilar adenopathy is noted. These changes are highly suspicious for underlying neoplasm. Further evaluation by means of CT of the chest with contrast is recommended. No bony abnormality is seen. IMPRESSION: Changes consistent with a right middle lobe mass and central hilar and mediastinal adenopathy with partial right middle lobe collapse. This is consistent with neoplasm and CT evaluation is recommended. Electronically Signed   By: Inez Catalina M.D.   On: 07/14/2016 16:33   Ct Chest W Contrast  Result Date: 07/14/2016 CLINICAL DATA:  Right lung mass and right middle lobe collapse EXAM: CT CHEST, ABDOMEN, AND  PELVIS WITH CONTRAST TECHNIQUE: Multidetector CT imaging of the chest, abdomen and pelvis was performed following the standard protocol during bolus administration of intravenous contrast. CONTRAST:  142mL ISOVUE-300 IOPAMIDOL (ISOVUE-300) INJECTION 61% COMPARISON:  Chest x-ray from earlier in the same day. FINDINGS: CT CHEST FINDINGS Cardiovascular: The thoracic aorta shows a bovine it branching pattern. No aneurysmal dilatation or dissection is seen. No cardiac enlargement is noted. The pulmonary artery as visualized is within normal limits with the exception of attenuation of branches on the right secondary to hilar and mediastinal adenopathy. Mediastinum/Nodes: Thoracic inlet is within normal limits. Multiple mediastinal mobile masses are seen. One is noted in the anterior mediastinum measuring 5.8 x 2.9 cm. A second is noted anterior to the carina measuring at least 5.1 by 5.8 cm. This mildly displaces the superior vena cava and causes mass effect upon the right main pulmonary artery. Subcarinal nodal mass is also noted measuring 4.5 by 3.4 cm in greatest dimension. Smaller  right hilar nodes are seen. The largest of these measures 26 mm in short axis. The esophagus as visualize is within normal limits. Lungs/Pleura: The left lung demonstrates some emphysematous changes. No focal infiltrate or sizable effusion is seen. There is a 1.8 x 1.0 soft tissue density along the posteromedial aspect of the left lung. This appears to be within the chest wall as opposed to within the lung itself. The right lung also demonstrates significant emphysematous changes. There is a mass lesion which measures 2.8 by 3.0 cm in greatest transverse and AP dimension within the right middle lobe as seen on previous chest x-ray. Near complete collapse of the right middle lobe is seen secondary to central bronchial occlusion. Musculoskeletal: Degenerative changes of the thoracic spine are noted. No definitive lytic or sclerotic lesions are seen. CT ABDOMEN PELVIS FINDINGS Hepatobiliary: The liver demonstrates a focal hyperdense lesion likely representing a small hemangioma. The gallbladder is within normal limits. Pancreas: Pancreas is within normal limits. Spleen: Normal in size without focal abnormality. Adrenals/Urinary Tract: The adrenal glands are unremarkable. The kidneys demonstrate normal enhancement bilaterally. Renal cyst is noted on the left. A few small nonobstructing calculi are noted in the right kidney. Mild scarring in the right kidney is noted as well. The bladder is partially distended. Stomach/Bowel: No obstructive or inflammatory changes are seen. The appendix is within normal limits. Vascular/Lymphatic: Aortic atherosclerosis. No enlarged abdominal or pelvic lymph nodes. Reproductive: The uterus is within normal limits and somewhat displaced to the right. Ovarian cystic changes are seen bilaterally. Other: No abdominal wall hernia or abnormality. No abdominopelvic ascites. Musculoskeletal: In the left ischial tuberosity and extending superiorly into the posterior aspect of the left acetabulum,  there is a lytic lesion with significant soft tissue component. It nearly completely destroyed is the ischial tuberosity and posterior elements of the right acetabulum. The soft tissue component extends inferiorly along the obturator foramen and measures approximately 12 by 8.6 cm in greatest dimension. It also extends superiorly along the left pelvic wall. IMPRESSION: Right middle lobe mass lesion with associated hilar and mediastinal adenopathy and near complete right middle lobe collapse. This is consistent with a primary pulmonary neoplasm with metastatic lesions till proven otherwise. PET-CT and tissue sampling are recommended for further evaluation. Small soft tissue nodule along the posterior costophrenic angle medially on the left. This may represent a small metastatic focus. Bony metastatic disease involving the ischial tuberosity on the left with large significant surrounding soft tissue component consistent with metastatic disease. Electronically Signed  By: Inez Catalina M.D.   On: 07/14/2016 18:10   Ct Abdomen Pelvis W Contrast  Result Date: 07/14/2016 CLINICAL DATA:  Right lung mass and right middle lobe collapse EXAM: CT CHEST, ABDOMEN, AND PELVIS WITH CONTRAST TECHNIQUE: Multidetector CT imaging of the chest, abdomen and pelvis was performed following the standard protocol during bolus administration of intravenous contrast. CONTRAST:  110mL ISOVUE-300 IOPAMIDOL (ISOVUE-300) INJECTION 61% COMPARISON:  Chest x-ray from earlier in the same day. FINDINGS: CT CHEST FINDINGS Cardiovascular: The thoracic aorta shows a bovine it branching pattern. No aneurysmal dilatation or dissection is seen. No cardiac enlargement is noted. The pulmonary artery as visualized is within normal limits with the exception of attenuation of branches on the right secondary to hilar and mediastinal adenopathy. Mediastinum/Nodes: Thoracic inlet is within normal limits. Multiple mediastinal mobile masses are seen. One is noted  in the anterior mediastinum measuring 5.8 x 2.9 cm. A second is noted anterior to the carina measuring at least 5.1 by 5.8 cm. This mildly displaces the superior vena cava and causes mass effect upon the right main pulmonary artery. Subcarinal nodal mass is also noted measuring 4.5 by 3.4 cm in greatest dimension. Smaller right hilar nodes are seen. The largest of these measures 26 mm in short axis. The esophagus as visualize is within normal limits. Lungs/Pleura: The left lung demonstrates some emphysematous changes. No focal infiltrate or sizable effusion is seen. There is a 1.8 x 1.0 soft tissue density along the posteromedial aspect of the left lung. This appears to be within the chest wall as opposed to within the lung itself. The right lung also demonstrates significant emphysematous changes. There is a mass lesion which measures 2.8 by 3.0 cm in greatest transverse and AP dimension within the right middle lobe as seen on previous chest x-ray. Near complete collapse of the right middle lobe is seen secondary to central bronchial occlusion. Musculoskeletal: Degenerative changes of the thoracic spine are noted. No definitive lytic or sclerotic lesions are seen. CT ABDOMEN PELVIS FINDINGS Hepatobiliary: The liver demonstrates a focal hyperdense lesion likely representing a small hemangioma. The gallbladder is within normal limits. Pancreas: Pancreas is within normal limits. Spleen: Normal in size without focal abnormality. Adrenals/Urinary Tract: The adrenal glands are unremarkable. The kidneys demonstrate normal enhancement bilaterally. Renal cyst is noted on the left. A few small nonobstructing calculi are noted in the right kidney. Mild scarring in the right kidney is noted as well. The bladder is partially distended. Stomach/Bowel: No obstructive or inflammatory changes are seen. The appendix is within normal limits. Vascular/Lymphatic: Aortic atherosclerosis. No enlarged abdominal or pelvic lymph nodes.  Reproductive: The uterus is within normal limits and somewhat displaced to the right. Ovarian cystic changes are seen bilaterally. Other: No abdominal wall hernia or abnormality. No abdominopelvic ascites. Musculoskeletal: In the left ischial tuberosity and extending superiorly into the posterior aspect of the left acetabulum, there is a lytic lesion with significant soft tissue component. It nearly completely destroyed is the ischial tuberosity and posterior elements of the right acetabulum. The soft tissue component extends inferiorly along the obturator foramen and measures approximately 12 by 8.6 cm in greatest dimension. It also extends superiorly along the left pelvic wall. IMPRESSION: Right middle lobe mass lesion with associated hilar and mediastinal adenopathy and near complete right middle lobe collapse. This is consistent with a primary pulmonary neoplasm with metastatic lesions till proven otherwise. PET-CT and tissue sampling are recommended for further evaluation. Small soft tissue nodule along the posterior costophrenic  angle medially on the left. This may represent a small metastatic focus. Bony metastatic disease involving the ischial tuberosity on the left with large significant surrounding soft tissue component consistent with metastatic disease. Electronically Signed   By: Inez Catalina M.D.   On: 07/14/2016 18:10     Scheduled Meds: . amLODipine  10 mg Oral QHS  . docusate sodium  100 mg Oral BID  . gabapentin  300 mg Oral BID  . pravastatin  20 mg Oral q1800   Continuous Infusions: . sodium chloride    . 0.9 % NaCl with KCl 40 mEq / L 100 mL/hr (07/15/16 0019)  . methocarbamol (ROBAXIN)  IV 500 mg (07/15/16 0019)  . piperacillin-tazobactam (ZOSYN)  IV 3.375 g (07/15/16 4175)     Marzetta Board, MD, PhD [facility] Pager [phone]  If 7PM-7AM, please contact night-coverage [email or web address] Password [email or web address] 07/15/2016, 11:31 AM

## 2016-07-16 ENCOUNTER — Inpatient Hospital Stay (HOSPITAL_COMMUNITY): Payer: Medicaid Other

## 2016-07-16 ENCOUNTER — Ambulatory Visit
Admit: 2016-07-16 | Discharge: 2016-07-16 | Disposition: A | Discharge status: Home / Self Care | Payer: Medicaid Other | Source: Ambulatory Visit | Attending: Radiation Oncology | Admitting: Radiation Oncology

## 2016-07-16 ENCOUNTER — Encounter (HOSPITAL_COMMUNITY): Payer: Self-pay | Admitting: Radiology

## 2016-07-16 ENCOUNTER — Ambulatory Visit
Admit: 2016-07-16 | Discharge: 2016-07-16 | Disposition: A | Discharge status: Home / Self Care | Payer: Medicaid Other | Attending: Radiation Oncology | Admitting: Radiation Oncology

## 2016-07-16 DIAGNOSIS — C3490 Malignant neoplasm of unspecified part of unspecified bronchus or lung: Secondary | ICD-10-CM

## 2016-07-16 DIAGNOSIS — Z510 Encounter for antineoplastic radiation therapy: Secondary | ICD-10-CM | POA: Diagnosis not present

## 2016-07-16 DIAGNOSIS — C7951 Secondary malignant neoplasm of bone: Secondary | ICD-10-CM | POA: Insufficient documentation

## 2016-07-16 DIAGNOSIS — M25552 Pain in left hip: Secondary | ICD-10-CM | POA: Insufficient documentation

## 2016-07-16 DIAGNOSIS — D72829 Elevated white blood cell count, unspecified: Secondary | ICD-10-CM

## 2016-07-16 DIAGNOSIS — C7931 Secondary malignant neoplasm of brain: Secondary | ICD-10-CM | POA: Insufficient documentation

## 2016-07-16 DIAGNOSIS — E43 Unspecified severe protein-calorie malnutrition: Secondary | ICD-10-CM

## 2016-07-16 DIAGNOSIS — C3491 Malignant neoplasm of unspecified part of right bronchus or lung: Secondary | ICD-10-CM

## 2016-07-16 DIAGNOSIS — C7802 Secondary malignant neoplasm of left lung: Secondary | ICD-10-CM | POA: Insufficient documentation

## 2016-07-16 LAB — TYPE AND SCREEN
ABO/RH(D): A POS
Antibody Screen: NEGATIVE
UNIT DIVISION: 0
UNIT DIVISION: 0

## 2016-07-16 LAB — COMPREHENSIVE METABOLIC PANEL
ALBUMIN: 2.3 g/dL — AB (ref 3.5–5.0)
ALT: 11 U/L — AB (ref 14–54)
AST: 10 U/L — ABNORMAL LOW (ref 15–41)
Alkaline Phosphatase: 138 U/L — ABNORMAL HIGH (ref 38–126)
Anion gap: 10 (ref 5–15)
BILIRUBIN TOTAL: 0.6 mg/dL (ref 0.3–1.2)
BUN: 12 mg/dL (ref 6–20)
CHLORIDE: 106 mmol/L (ref 101–111)
CO2: 24 mmol/L (ref 22–32)
CREATININE: 0.7 mg/dL (ref 0.44–1.00)
Calcium: 8.7 mg/dL — ABNORMAL LOW (ref 8.9–10.3)
GFR calc Af Amer: >60 mL/min (ref >60)
GLUCOSE: 125 mg/dL — AB (ref 65–99)
Potassium: 3.9 mmol/L (ref 3.5–5.1)
Sodium: 140 mmol/L (ref 135–145)
TOTAL PROTEIN: 7.1 g/dL (ref 6.5–8.1)

## 2016-07-16 LAB — CBC WITH DIFFERENTIAL/PLATELET
Basophils Absolute: 0 10*3/uL (ref 0.0–0.1)
Basophils Relative: 0 %
Eosinophils Absolute: 0 10*3/uL (ref 0.0–0.7)
Eosinophils Relative: 0 %
HCT: 32 % — ABNORMAL LOW (ref 36.0–46.0)
Hemoglobin: 10.4 g/dL — ABNORMAL LOW (ref 12.0–15.0)
LYMPHS ABS: 2.5 10*3/uL (ref 0.7–4.0)
Lymphocytes Relative: 8 %
MCH: 25.5 pg — ABNORMAL LOW (ref 26.0–34.0)
MCHC: 32.5 g/dL (ref 30.0–36.0)
MCV: 78.4 fL (ref 78.0–100.0)
Monocytes Absolute: 0.6 10*3/uL (ref 0.1–1.0)
Monocytes Relative: 2 %
NEUTROS PCT: 90 %
Neutro Abs: 28 10*3/uL — ABNORMAL HIGH (ref 1.7–7.7)
PLATELETS: 632 10*3/uL — AB (ref 150–400)
RBC: 4.08 MIL/uL (ref 3.87–5.11)
RDW: 15.6 % — ABNORMAL HIGH (ref 11.5–15.5)
WBC: 31.1 10*3/uL — AB (ref 4.0–10.5)

## 2016-07-16 LAB — BPAM RBC
BLOOD PRODUCT EXPIRATION DATE: 201806072359
Blood Product Expiration Date: 201806202359
ISSUE DATE / TIME: 201806030639
ISSUE DATE / TIME: 201806030321
Unit Type and Rh: 6200
Unit Type and Rh: 6200

## 2016-07-16 LAB — LACTATE DEHYDROGENASE: LDH: 242 U/L — AB (ref 98–192)

## 2016-07-16 LAB — IRON AND TIBC
Iron: 19 ug/dL — ABNORMAL LOW (ref 28–170)
SATURATION RATIOS: 10 % — AB (ref 10.4–31.8)
TIBC: 188 ug/dL — ABNORMAL LOW (ref 250–450)
UIBC: 169 ug/dL

## 2016-07-16 LAB — FERRITIN: FERRITIN: 827 ng/mL — AB (ref 11–307)

## 2016-07-16 LAB — PROTIME-INR
INR: 1.19
PROTHROMBIN TIME: 15.2 s (ref 11.4–15.2)

## 2016-07-16 LAB — HIV ANTIBODY (ROUTINE TESTING W REFLEX): HIV SCREEN 4TH GENERATION: NONREACTIVE

## 2016-07-16 LAB — URIC ACID: Uric Acid, Serum: 2.9 mg/dL (ref 2.3–6.6)

## 2016-07-16 MED ORDER — GADOBENATE DIMEGLUMINE 529 MG/ML IV SOLN
15.0000 mL | Freq: Once | INTRAVENOUS | Status: AC | PRN
  Administered 2016-07-16: 15 mL via INTRAVENOUS

## 2016-07-16 MED ORDER — MIDAZOLAM HCL 2 MG/2ML IJ SOLN
INTRAMUSCULAR | Status: AC
  Filled 2016-07-16: qty 6

## 2016-07-16 MED ORDER — FENTANYL CITRATE (PF) 100 MCG/2ML IJ SOLN
INTRAMUSCULAR | Status: AC
  Filled 2016-07-16: qty 4

## 2016-07-16 MED ORDER — MIDAZOLAM HCL 2 MG/2ML IJ SOLN
INTRAMUSCULAR | Status: AC | PRN
  Administered 2016-07-16 (×2): 1 mg via INTRAVENOUS

## 2016-07-16 MED ORDER — LIDOCAINE HCL (PF) 1 % IJ SOLN
INTRAMUSCULAR | Status: AC | PRN
  Administered 2016-07-16: 30 mL

## 2016-07-16 MED ORDER — FENTANYL CITRATE (PF) 100 MCG/2ML IJ SOLN
INTRAMUSCULAR | Status: AC | PRN
  Administered 2016-07-16: 25 ug via INTRAVENOUS
  Administered 2016-07-16: 50 ug via INTRAVENOUS

## 2016-07-16 NOTE — Progress Notes (Signed)
Biopsy site C, D, and I.

## 2016-07-16 NOTE — Progress Notes (Signed)
Biopsy site C,D and I.

## 2016-07-16 NOTE — Progress Notes (Signed)
Heather Hinton   DOB:1960/05/13   LF#:[id number]    Subjective: The patient is standing up.  Her pain is better control with steroids and MS Contin.  Biopsy pending.  Multiple consultations are in place  Objective:  Vitals:   07/15/16 2056 07/16/16 0455  BP: 138/75 122/76  Pulse: 91 88  Resp: 18 18  Temp: 99.7 F (37.6 C) 98.1 F (36.7 C)     Intake/Output Summary (Last 24 hours) at 07/16/16 1308 Last data filed at 07/16/16 5852  Gross per 24 hour  Intake          1938.33 ml  Output              800 ml  Net          1138.33 ml    GENERAL:alert, no distress and comfortable SKIN: skin color, texture, turgor are normal, no rashes or significant lesions EYES: normal, Conjunctiva are pink and non-injected, sclera clear Musculoskeletal:no cyanosis of digits and no clubbing  NEURO: alert & oriented x 3 with fluent speech, no focal motor/sensory deficits   Labs:  Lab Results  Component Value Date   WBC 31.1 (H) 07/16/2016   HGB 10.4 (L) 07/16/2016   HCT 32.0 (L) 07/16/2016   MCV 78.4 07/16/2016   PLT 632 (H) 07/16/2016   NEUTROABS 28.0 (H) 07/16/2016    Lab Results  Component Value Date   NA 140 07/16/2016   K 3.9 07/16/2016   CL 106 07/16/2016   CO2 24 07/16/2016    Studies:  Dg Chest 2 View  Result Date: 07/14/2016 CLINICAL DATA:  Fatigue EXAM: CHEST  2 VIEW COMPARISON:  04/15/2016 FINDINGS: Cardiac shadow is within normal limits. Lungs are well aerated bilaterally. Right middle lobe infiltrate with volume loss is noted. Additionally there is a a rounded masslike density in the lateral aspect of the right measuring approximately 3 cm. Considerable right peritracheal and right hilar adenopathy is noted. These changes are highly suspicious for underlying neoplasm. Further evaluation by means of CT of the chest with contrast is recommended. No bony abnormality is seen. IMPRESSION: Changes consistent with a right middle lobe mass and central hilar and mediastinal adenopathy with  partial right middle lobe collapse. This is consistent with neoplasm and CT evaluation is recommended. Electronically Signed   By: Inez Catalina M.D.   On: 07/14/2016 16:33   Ct Chest W Contrast  Result Date: 07/14/2016 CLINICAL DATA:  Right lung mass and right middle lobe collapse EXAM: CT CHEST, ABDOMEN, AND PELVIS WITH CONTRAST TECHNIQUE: Multidetector CT imaging of the chest, abdomen and pelvis was performed following the standard protocol during bolus administration of intravenous contrast. CONTRAST:  155mL ISOVUE-300 IOPAMIDOL (ISOVUE-300) INJECTION 61% COMPARISON:  Chest x-ray from earlier in the same day. FINDINGS: CT CHEST FINDINGS Cardiovascular: The thoracic aorta shows a bovine it branching pattern. No aneurysmal dilatation or dissection is seen. No cardiac enlargement is noted. The pulmonary artery as visualized is within normal limits with the exception of attenuation of branches on the right secondary to hilar and mediastinal adenopathy. Mediastinum/Nodes: Thoracic inlet is within normal limits. Multiple mediastinal mobile masses are seen. One is noted in the anterior mediastinum measuring 5.8 x 2.9 cm. A second is noted anterior to the carina measuring at least 5.1 by 5.8 cm. This mildly displaces the superior vena cava and causes mass effect upon the right main pulmonary artery. Subcarinal nodal mass is also noted measuring 4.5 by 3.4 cm in greatest dimension. Smaller right hilar nodes  are seen. The largest of these measures 26 mm in short axis. The esophagus as visualize is within normal limits. Lungs/Pleura: The left lung demonstrates some emphysematous changes. No focal infiltrate or sizable effusion is seen. There is a 1.8 x 1.0 soft tissue density along the posteromedial aspect of the left lung. This appears to be within the chest wall as opposed to within the lung itself. The right lung also demonstrates significant emphysematous changes. There is a mass lesion which measures 2.8 by 3.0 cm  in greatest transverse and AP dimension within the right middle lobe as seen on previous chest x-ray. Near complete collapse of the right middle lobe is seen secondary to central bronchial occlusion. Musculoskeletal: Degenerative changes of the thoracic spine are noted. No definitive lytic or sclerotic lesions are seen. CT ABDOMEN PELVIS FINDINGS Hepatobiliary: The liver demonstrates a focal hyperdense lesion likely representing a small hemangioma. The gallbladder is within normal limits. Pancreas: Pancreas is within normal limits. Spleen: Normal in size without focal abnormality. Adrenals/Urinary Tract: The adrenal glands are unremarkable. The kidneys demonstrate normal enhancement bilaterally. Renal cyst is noted on the left. A few small nonobstructing calculi are noted in the right kidney. Mild scarring in the right kidney is noted as well. The bladder is partially distended. Stomach/Bowel: No obstructive or inflammatory changes are seen. The appendix is within normal limits. Vascular/Lymphatic: Aortic atherosclerosis. No enlarged abdominal or pelvic lymph nodes. Reproductive: The uterus is within normal limits and somewhat displaced to the right. Ovarian cystic changes are seen bilaterally. Other: No abdominal wall hernia or abnormality. No abdominopelvic ascites. Musculoskeletal: In the left ischial tuberosity and extending superiorly into the posterior aspect of the left acetabulum, there is a lytic lesion with significant soft tissue component. It nearly completely destroyed is the ischial tuberosity and posterior elements of the right acetabulum. The soft tissue component extends inferiorly along the obturator foramen and measures approximately 12 by 8.6 cm in greatest dimension. It also extends superiorly along the left pelvic wall. IMPRESSION: Right middle lobe mass lesion with associated hilar and mediastinal adenopathy and near complete right middle lobe collapse. This is consistent with a primary  pulmonary neoplasm with metastatic lesions till proven otherwise. PET-CT and tissue sampling are recommended for further evaluation. Small soft tissue nodule along the posterior costophrenic angle medially on the left. This may represent a small metastatic focus. Bony metastatic disease involving the ischial tuberosity on the left with large significant surrounding soft tissue component consistent with metastatic disease. Electronically Signed   By: Inez Catalina M.D.   On: 07/14/2016 18:10   Ct Abdomen Pelvis W Contrast  Result Date: 07/14/2016 CLINICAL DATA:  Right lung mass and right middle lobe collapse EXAM: CT CHEST, ABDOMEN, AND PELVIS WITH CONTRAST TECHNIQUE: Multidetector CT imaging of the chest, abdomen and pelvis was performed following the standard protocol during bolus administration of intravenous contrast. CONTRAST:  158mL ISOVUE-300 IOPAMIDOL (ISOVUE-300) INJECTION 61% COMPARISON:  Chest x-ray from earlier in the same day. FINDINGS: CT CHEST FINDINGS Cardiovascular: The thoracic aorta shows a bovine it branching pattern. No aneurysmal dilatation or dissection is seen. No cardiac enlargement is noted. The pulmonary artery as visualized is within normal limits with the exception of attenuation of branches on the right secondary to hilar and mediastinal adenopathy. Mediastinum/Nodes: Thoracic inlet is within normal limits. Multiple mediastinal mobile masses are seen. One is noted in the anterior mediastinum measuring 5.8 x 2.9 cm. A second is noted anterior to the carina measuring at least 5.1 by  5.8 cm. This mildly displaces the superior vena cava and causes mass effect upon the right main pulmonary artery. Subcarinal nodal mass is also noted measuring 4.5 by 3.4 cm in greatest dimension. Smaller right hilar nodes are seen. The largest of these measures 26 mm in short axis. The esophagus as visualize is within normal limits. Lungs/Pleura: The left lung demonstrates some emphysematous changes. No  focal infiltrate or sizable effusion is seen. There is a 1.8 x 1.0 soft tissue density along the posteromedial aspect of the left lung. This appears to be within the chest wall as opposed to within the lung itself. The right lung also demonstrates significant emphysematous changes. There is a mass lesion which measures 2.8 by 3.0 cm in greatest transverse and AP dimension within the right middle lobe as seen on previous chest x-ray. Near complete collapse of the right middle lobe is seen secondary to central bronchial occlusion. Musculoskeletal: Degenerative changes of the thoracic spine are noted. No definitive lytic or sclerotic lesions are seen. CT ABDOMEN PELVIS FINDINGS Hepatobiliary: The liver demonstrates a focal hyperdense lesion likely representing a small hemangioma. The gallbladder is within normal limits. Pancreas: Pancreas is within normal limits. Spleen: Normal in size without focal abnormality. Adrenals/Urinary Tract: The adrenal glands are unremarkable. The kidneys demonstrate normal enhancement bilaterally. Renal cyst is noted on the left. A few small nonobstructing calculi are noted in the right kidney. Mild scarring in the right kidney is noted as well. The bladder is partially distended. Stomach/Bowel: No obstructive or inflammatory changes are seen. The appendix is within normal limits. Vascular/Lymphatic: Aortic atherosclerosis. No enlarged abdominal or pelvic lymph nodes. Reproductive: The uterus is within normal limits and somewhat displaced to the right. Ovarian cystic changes are seen bilaterally. Other: No abdominal wall hernia or abnormality. No abdominopelvic ascites. Musculoskeletal: In the left ischial tuberosity and extending superiorly into the posterior aspect of the left acetabulum, there is a lytic lesion with significant soft tissue component. It nearly completely destroyed is the ischial tuberosity and posterior elements of the right acetabulum. The soft tissue component extends  inferiorly along the obturator foramen and measures approximately 12 by 8.6 cm in greatest dimension. It also extends superiorly along the left pelvic wall. IMPRESSION: Right middle lobe mass lesion with associated hilar and mediastinal adenopathy and near complete right middle lobe collapse. This is consistent with a primary pulmonary neoplasm with metastatic lesions till proven otherwise. PET-CT and tissue sampling are recommended for further evaluation. Small soft tissue nodule along the posterior costophrenic angle medially on the left. This may represent a small metastatic focus. Bony metastatic disease involving the ischial tuberosity on the left with large significant surrounding soft tissue component consistent with metastatic disease. Electronically Signed   By: Inez Catalina M.D.   On: 07/14/2016 18:10    Assessment & Plan:   Lung masses, significant bone lesion, metastatic lung cancer until proven otherwise I recommend MRI of the brain to complete staging I recommend interventional radiologists for biopsy I recommend radiation oncology consultation for palliative radiation treatments, both to alleviate postobstructive pneumonitis as well as palliative control of her bone pain in the pelvis I recommend palliative care consult to assist in pain management I will consult physical therapy to assess in gait assessment and safety I will return once biopsy results are available  Severe cancer pain She was started on MS Contin on 07/15/2016 with better pain control, along with low-dose dexamethasone Appreciate assistance from palliative care team for pain management  Severe anorexia,  malignant cachexia, severe protein calorie malnutrition Hopefully, low-dose dexamethasone will help with appetite She went a dietitian consult for nutritional supplement  Severe microcytic anemia She has received blood transfusion Iron studies confirm evidence of iron deficiency and anemia chronic illness.  The  elevated ferritin and platelet count is likely reactive in nature. I will start her on oral iron supplement  Discharge planning She is too ill to be discharged. She will likely be here until the end of the week  Heath Lark, MD 07/16/2016  1:08 PM

## 2016-07-16 NOTE — Progress Notes (Signed)
Bipsoy site C, D and I.

## 2016-07-16 NOTE — Progress Notes (Signed)
Biopsy site C, D and I.

## 2016-07-16 NOTE — Progress Notes (Signed)
PT Cancellation Note  Patient Details Name: Heather Hinton MRN: [id number] DOB: October 25, 1960   Cancelled Treatment:    Reason Eval/Treat Not Completed: Other (comment) Earlier patient with radiologist, now in [facility]. Will check back in AM.   Claretha Cooper 07/16/2016, 4:01 PM

## 2016-07-16 NOTE — Progress Notes (Signed)
PROGRESS NOTE  Heather Hinton MEQ:[id number] DOB: [birth year] DOA: 07/14/2016 PCP: Neale Burly, MD   LOS: 2 days   Brief Narrative / Interim history: 56 y.o. female with medical history significant of HTN and recent (3 months) h/o spinal stenosis presenting with weakness, left hip pain as well as subjective chills and fevers with cough.  Symptoms have been progressing  Assessment & Plan: Principal Problem:   Metastatic lung cancer (metastasis from lung to other site) [facility]) Active Problems:   Essential hypertension   Hypokalemia   Hyperglycemia   Symptomatic anemia   Thrombocytosis (HCC)   Cancer associated pain   Metastasis to bone [facility])   Left hip pain   Palliative care by specialist   Probable metastatic lung cancer -Patient with new diagnosis of lung mass -Consulted oncology -Interventional radiology plans for biopsy today -Radiation oncology to see patient as well, plan for radiation therapy to start to her lung and hip area  Postobstructive pneumonia -She is near complete right middle lobe collapse, she has been febrile in the hospital and has a cough -Continue IV antibiotics, last fever on 6/3  Hypertension -Continue home Norvasc, blood pressure is stable at 138/75 this morning  Hyperlipidemia -Continue home statin  Normocytic anemia -Likely due to underlying malignancy  Leukocytosis -Malignancy versus infection  Tobacco abuse, 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, smoked for 40 years   DVT prophylaxis: SCD, will need Lovenox s.q post biopsy Code Status: Full code Family Communication: No family at bedside Disposition Plan: home when ready   Consultants:   Oncology   Procedures:   None   Antimicrobials:  Zosyn 6/3 >>   Subjective: -She is feeling a lot better today, pain is better, she was able to rest well overnight  Objective: Vitals:   07/15/16 0920 07/15/16 1406 07/15/16 2056 07/16/16 0455  BP: 138/64 129/88 138/75 122/76    Pulse: (!) 101 (!) 114 91 88  Resp: 18 18 18 18   Temp: 99.2 F (37.3 C) (!) 100.5 F (38.1 C) 99.7 F (37.6 C) 98.1 F (36.7 C)  TempSrc: Oral Oral Oral Oral  SpO2: 98% 98% 100% 99%  Weight:      Height:        Intake/Output Summary (Last 24 hours) at 07/16/16 1144 Last data filed at 07/16/16 0643  Gross per 24 hour  Intake          1938.33 ml  Output             1050 ml  Net           888.33 ml   Filed Weights   07/14/16 1553  Weight: 69.4 kg (153 lb)    Examination:  Vitals:   07/15/16 0920 07/15/16 1406 07/15/16 2056 07/16/16 0455  BP: 138/64 129/88 138/75 122/76  Pulse: (!) 101 (!) 114 91 88  Resp: 18 18 18 18   Temp: 99.2 F (37.3 C) (!) 100.5 F (38.1 C) 99.7 F (37.6 C) 98.1 F (36.7 C)  TempSrc: Oral Oral Oral Oral  SpO2: 98% 98% 100% 99%  Weight:      Height:        Constitutional: NAD Eyes: lids and conjunctivae normal Respiratory: clear to auscultation bilaterally, no wheezing, no crackles.  Cardiovascular: Regular rate and rhythm, no murmurs / rubs / gallops.  Abdomen: no tenderness. Bowel sounds positive.  Skin: no rashes, lesions, ulcers. No induration Neurologic: non focal   Data Reviewed: I have independently reviewed following labs and imaging  studies   CBC:  Recent Labs Lab 07/14/16 1612 07/15/16 1157 07/16/16 0440  WBC 22.4* 29.1* 31.1*  NEUTROABS 17.7*  --  28.0*  HGB 7.9* 10.6* 10.4*  HCT 25.9* 31.9* 32.0*  MCV 79.0 76.7* 78.4  PLT 743* 660* 245*   Basic Metabolic Panel:  Recent Labs Lab 07/14/16 1612 07/15/16 1157 07/16/16 0440  NA 142 139 140  K 2.5* 2.8* 3.9  CL 104 104 106  CO2 25 25 24   GLUCOSE 115* 120* 125*  BUN 12 7 12   CREATININE 0.82 0.67 0.70  CALCIUM 9.1 8.5* 8.7*  MG 2.1  --   --    GFR: Estimated Creatinine Clearance: 76.4 mL/min (by C-G formula based on SCr of 0.7 mg/dL). Liver Function Tests:  Recent Labs Lab 07/14/16 1612 07/16/16 0440  AST 14* 10*  ALT 11* 11*  ALKPHOS 174* 138*   BILITOT 0.6 0.6  PROT 8.1 7.1  ALBUMIN 2.5* 2.3*   No results for input(s): LIPASE, AMYLASE in the last 168 hours. No results for input(s): AMMONIA in the last 168 hours. Coagulation Profile:  Recent Labs Lab 07/16/16 0440  INR 1.19   Cardiac Enzymes: No results for input(s): CKTOTAL, CKMB, CKMBINDEX, TROPONINI in the last 168 hours. BNP (last 3 results) No results for input(s): PROBNP in the last 8760 hours. HbA1C: No results for input(s): HGBA1C in the last 72 hours. CBG: No results for input(s): GLUCAP in the last 168 hours. Lipid Profile: No results for input(s): CHOL, HDL, LDLCALC, TRIG, CHOLHDL, LDLDIRECT in the last 72 hours. Thyroid Function Tests: No results for input(s): TSH, T4TOTAL, FREET4, T3FREE, THYROIDAB in the last 72 hours. Anemia Panel:  Recent Labs  07/16/16 0440  FERRITIN 827*  TIBC 188*  IRON 19*   Urine analysis:    Component Value Date/Time   COLORURINE STRAW (A) 07/14/2016 1856   APPEARANCEUR CLEAR 07/14/2016 1856   LABSPEC 1.020 07/14/2016 1856   PHURINE 7.0 07/14/2016 1856   GLUCOSEU NEGATIVE 07/14/2016 1856   HGBUR NEGATIVE 07/14/2016 1856   BILIRUBINUR NEGATIVE 07/14/2016 1856   KETONESUR NEGATIVE 07/14/2016 1856   PROTEINUR NEGATIVE 07/14/2016 1856   NITRITE NEGATIVE 07/14/2016 1856   LEUKOCYTESUR NEGATIVE 07/14/2016 1856   Sepsis Labs: Invalid input(s): PROCALCITONIN, LACTICIDVEN  No results found for this or any previous visit (from the past 240 hour(s)).    Radiology Studies: Dg Chest 2 View  Result Date: 07/14/2016 CLINICAL DATA:  Fatigue EXAM: CHEST  2 VIEW COMPARISON:  04/15/2016 FINDINGS: Cardiac shadow is within normal limits. Lungs are well aerated bilaterally. Right middle lobe infiltrate with volume loss is noted. Additionally there is a a rounded masslike density in the lateral aspect of the right measuring approximately 3 cm. Considerable right peritracheal and right hilar adenopathy is noted. These changes are  highly suspicious for underlying neoplasm. Further evaluation by means of CT of the chest with contrast is recommended. No bony abnormality is seen. IMPRESSION: Changes consistent with a right middle lobe mass and central hilar and mediastinal adenopathy with partial right middle lobe collapse. This is consistent with neoplasm and CT evaluation is recommended. Electronically Signed   By: Inez Catalina M.D.   On: 07/14/2016 16:33   Ct Chest W Contrast  Result Date: 07/14/2016 CLINICAL DATA:  Right lung mass and right middle lobe collapse EXAM: CT CHEST, ABDOMEN, AND PELVIS WITH CONTRAST TECHNIQUE: Multidetector CT imaging of the chest, abdomen and pelvis was performed following the standard protocol during bolus administration of intravenous contrast. CONTRAST:  162mL ISOVUE-300 IOPAMIDOL (ISOVUE-300) INJECTION 61% COMPARISON:  Chest x-ray from earlier in the same day. FINDINGS: CT CHEST FINDINGS Cardiovascular: The thoracic aorta shows a bovine it branching pattern. No aneurysmal dilatation or dissection is seen. No cardiac enlargement is noted. The pulmonary artery as visualized is within normal limits with the exception of attenuation of branches on the right secondary to hilar and mediastinal adenopathy. Mediastinum/Nodes: Thoracic inlet is within normal limits. Multiple mediastinal mobile masses are seen. One is noted in the anterior mediastinum measuring 5.8 x 2.9 cm. A second is noted anterior to the carina measuring at least 5.1 by 5.8 cm. This mildly displaces the superior vena cava and causes mass effect upon the right main pulmonary artery. Subcarinal nodal mass is also noted measuring 4.5 by 3.4 cm in greatest dimension. Smaller right hilar nodes are seen. The largest of these measures 26 mm in short axis. The esophagus as visualize is within normal limits. Lungs/Pleura: The left lung demonstrates some emphysematous changes. No focal infiltrate or sizable effusion is seen. There is a 1.8 x 1.0 soft  tissue density along the posteromedial aspect of the left lung. This appears to be within the chest wall as opposed to within the lung itself. The right lung also demonstrates significant emphysematous changes. There is a mass lesion which measures 2.8 by 3.0 cm in greatest transverse and AP dimension within the right middle lobe as seen on previous chest x-ray. Near complete collapse of the right middle lobe is seen secondary to central bronchial occlusion. Musculoskeletal: Degenerative changes of the thoracic spine are noted. No definitive lytic or sclerotic lesions are seen. CT ABDOMEN PELVIS FINDINGS Hepatobiliary: The liver demonstrates a focal hyperdense lesion likely representing a small hemangioma. The gallbladder is within normal limits. Pancreas: Pancreas is within normal limits. Spleen: Normal in size without focal abnormality. Adrenals/Urinary Tract: The adrenal glands are unremarkable. The kidneys demonstrate normal enhancement bilaterally. Renal cyst is noted on the left. A few small nonobstructing calculi are noted in the right kidney. Mild scarring in the right kidney is noted as well. The bladder is partially distended. Stomach/Bowel: No obstructive or inflammatory changes are seen. The appendix is within normal limits. Vascular/Lymphatic: Aortic atherosclerosis. No enlarged abdominal or pelvic lymph nodes. Reproductive: The uterus is within normal limits and somewhat displaced to the right. Ovarian cystic changes are seen bilaterally. Other: No abdominal wall hernia or abnormality. No abdominopelvic ascites. Musculoskeletal: In the left ischial tuberosity and extending superiorly into the posterior aspect of the left acetabulum, there is a lytic lesion with significant soft tissue component. It nearly completely destroyed is the ischial tuberosity and posterior elements of the right acetabulum. The soft tissue component extends inferiorly along the obturator foramen and measures approximately 12 by  8.6 cm in greatest dimension. It also extends superiorly along the left pelvic wall. IMPRESSION: Right middle lobe mass lesion with associated hilar and mediastinal adenopathy and near complete right middle lobe collapse. This is consistent with a primary pulmonary neoplasm with metastatic lesions till proven otherwise. PET-CT and tissue sampling are recommended for further evaluation. Small soft tissue nodule along the posterior costophrenic angle medially on the left. This may represent a small metastatic focus. Bony metastatic disease involving the ischial tuberosity on the left with large significant surrounding soft tissue component consistent with metastatic disease. Electronically Signed   By: Inez Catalina M.D.   On: 07/14/2016 18:10   Ct Abdomen Pelvis W Contrast  Result Date: 07/14/2016 CLINICAL DATA:  Right lung mass  and right middle lobe collapse EXAM: CT CHEST, ABDOMEN, AND PELVIS WITH CONTRAST TECHNIQUE: Multidetector CT imaging of the chest, abdomen and pelvis was performed following the standard protocol during bolus administration of intravenous contrast. CONTRAST:  162mL ISOVUE-300 IOPAMIDOL (ISOVUE-300) INJECTION 61% COMPARISON:  Chest x-ray from earlier in the same day. FINDINGS: CT CHEST FINDINGS Cardiovascular: The thoracic aorta shows a bovine it branching pattern. No aneurysmal dilatation or dissection is seen. No cardiac enlargement is noted. The pulmonary artery as visualized is within normal limits with the exception of attenuation of branches on the right secondary to hilar and mediastinal adenopathy. Mediastinum/Nodes: Thoracic inlet is within normal limits. Multiple mediastinal mobile masses are seen. One is noted in the anterior mediastinum measuring 5.8 x 2.9 cm. A second is noted anterior to the carina measuring at least 5.1 by 5.8 cm. This mildly displaces the superior vena cava and causes mass effect upon the right main pulmonary artery. Subcarinal nodal mass is also noted  measuring 4.5 by 3.4 cm in greatest dimension. Smaller right hilar nodes are seen. The largest of these measures 26 mm in short axis. The esophagus as visualize is within normal limits. Lungs/Pleura: The left lung demonstrates some emphysematous changes. No focal infiltrate or sizable effusion is seen. There is a 1.8 x 1.0 soft tissue density along the posteromedial aspect of the left lung. This appears to be within the chest wall as opposed to within the lung itself. The right lung also demonstrates significant emphysematous changes. There is a mass lesion which measures 2.8 by 3.0 cm in greatest transverse and AP dimension within the right middle lobe as seen on previous chest x-ray. Near complete collapse of the right middle lobe is seen secondary to central bronchial occlusion. Musculoskeletal: Degenerative changes of the thoracic spine are noted. No definitive lytic or sclerotic lesions are seen. CT ABDOMEN PELVIS FINDINGS Hepatobiliary: The liver demonstrates a focal hyperdense lesion likely representing a small hemangioma. The gallbladder is within normal limits. Pancreas: Pancreas is within normal limits. Spleen: Normal in size without focal abnormality. Adrenals/Urinary Tract: The adrenal glands are unremarkable. The kidneys demonstrate normal enhancement bilaterally. Renal cyst is noted on the left. A few small nonobstructing calculi are noted in the right kidney. Mild scarring in the right kidney is noted as well. The bladder is partially distended. Stomach/Bowel: No obstructive or inflammatory changes are seen. The appendix is within normal limits. Vascular/Lymphatic: Aortic atherosclerosis. No enlarged abdominal or pelvic lymph nodes. Reproductive: The uterus is within normal limits and somewhat displaced to the right. Ovarian cystic changes are seen bilaterally. Other: No abdominal wall hernia or abnormality. No abdominopelvic ascites. Musculoskeletal: In the left ischial tuberosity and extending  superiorly into the posterior aspect of the left acetabulum, there is a lytic lesion with significant soft tissue component. It nearly completely destroyed is the ischial tuberosity and posterior elements of the right acetabulum. The soft tissue component extends inferiorly along the obturator foramen and measures approximately 12 by 8.6 cm in greatest dimension. It also extends superiorly along the left pelvic wall. IMPRESSION: Right middle lobe mass lesion with associated hilar and mediastinal adenopathy and near complete right middle lobe collapse. This is consistent with a primary pulmonary neoplasm with metastatic lesions till proven otherwise. PET-CT and tissue sampling are recommended for further evaluation. Small soft tissue nodule along the posterior costophrenic angle medially on the left. This may represent a small metastatic focus. Bony metastatic disease involving the ischial tuberosity on the left with large significant surrounding soft  tissue component consistent with metastatic disease. Electronically Signed   By: Inez Catalina M.D.   On: 07/14/2016 18:10     Scheduled Meds: . amLODipine  10 mg Oral QHS  . dexamethasone  2 mg Oral Q12H  . docusate sodium  100 mg Oral BID  . gabapentin  300 mg Oral BID  . morphine  15 mg Oral Q12H  . polyethylene glycol  17 g Oral Daily  . pravastatin  20 mg Oral q1800   Continuous Infusions: . sodium chloride    . 0.9 % NaCl with KCl 40 mEq / L 100 mL/hr (07/16/16 0144)  . methocarbamol (ROBAXIN)  IV 500 mg (07/15/16 0019)  . piperacillin-tazobactam (ZOSYN)  IV 3.375 g (07/16/16 0830)     Marzetta Board, MD, PhD [facility] Pager [phone]  If 7PM-7AM, please contact night-coverage [email or web address] Password [email or web address] 07/16/2016, 11:44 AM

## 2016-07-16 NOTE — Consult Note (Signed)
Chief Complaint: Patient was seen in consultation today for hip pain  Referring Physician(s):  Dr. Heath Lark  Supervising Physician: Arne Cleveland  Patient Status: [facility] - In-pt  History of Present Illness: Heather Hinton is a 56 y.o. female with past medical history of HTN and spinal stenosis presented to [facility] with weakness and fevers. Patient found to have lung cancer with suspected mets to the pelvis.   CT Abd/Pelvis 07/14/16: Right middle lobe mass lesion with associated hilar and mediastinal adenopathy and near complete right middle lobe collapse. This is consistent with a primary pulmonary neoplasm with metastatic lesions till proven otherwise. PET-CT and tissue sampling are recommended for further evaluation. Small soft tissue nodule along the posterior costophrenic angle medially on the left. This may represent a small metastatic focus. Bony metastatic disease involving the ischial tuberosity on the left with large significant surrounding soft tissue component consistent with metastatic disease.  IR consulted for biopsy of the L ishcial bony lesion.  Case reviewed by Dr. Pascal Lux and Dr. Vernard Gambles.  Patient appropriate for procedure as long as medically stable.   She has been NPO today and without fevers.  She continues on IV abx with elevated WBC due to her post-obstructive pneumonia.   Past Medical History:  Diagnosis Date  . Hypertension   . Spinal stenosis     Past Surgical History:  Procedure Laterality Date  . TUBAL LIGATION      Allergies: Iron  Medications: Prior to Admission medications   Medication Sig Start Date End Date Taking? Authorizing Provider  amLODipine (NORVASC) 10 MG tablet Take 10 mg by mouth at bedtime.   Yes [provider]  gabapentin (NEURONTIN) 300 MG capsule Take 300 mg by mouth 2 (two) times daily.   Yes [provider]  lovastatin (MEVACOR) 20 MG tablet Take 20 mg by mouth at bedtime.   Yes [provider]  Multiple Vitamin (MULTIVITAMIN WITH MINERALS) TABS tablet Take 1 tablet by mouth daily.   Yes [provider]     Family History  Problem Relation Age of Onset  . Hypertension Mother   . Diabetes Mother   . Breast cancer Mother   . Hypertension Father   . Hypertension Sister   . Pancreatic cancer Sister   . Lung cancer Maternal Grandmother     Social History   Social History  . Marital status: Legally Separated    Spouse name: N/A  . Number of children: 22  . Years of education: N/A   Occupational History  . Unemployed    Social History Main Topics  . Smoking status: Former Smoker    Packs/day: 0.50    Years: 40.00    Types: Cigarettes    Quit date: 10/2015  . Smokeless tobacco: Never Used  . Alcohol use No  . Drug use: No  . Sexual activity: Yes   Other Topics Concern  . None   Social History Narrative  . None    Review of Systems  Constitutional: Negative for fatigue and fever.  Respiratory: Negative for cough and shortness of breath.   Musculoskeletal: Positive for back pain (and hip pain).  Psychiatric/Behavioral: Negative for behavioral problems and confusion.    Vital Signs: BP 122/76 (BP Location: Left Arm)   Pulse 88   Temp 98.1 F (36.7 C) (Oral)   Resp 18   Ht '5\' 7"'$  (1.702 m)   Wt 153 lb (69.4 kg)   SpO2 99%   BMI 23.96 kg/m  Physical Exam  Constitutional: She is oriented to person, place, and time. She appears well-developed.  Cardiovascular: Normal rate, regular rhythm and normal heart sounds.   Pulmonary/Chest: Effort normal and breath sounds normal. No respiratory distress.  Neurological: She is alert and oriented to person, place, and time.  Skin: Skin is warm and dry.  Psychiatric: She has a normal mood and affect. Her behavior is normal. Judgment and thought content normal.  Nursing note and vitals reviewed.   Mallampati Score:  MD Evaluation Airway: WNL Heart: WNL Abdomen: WNL Chest/ Lungs: WNL ASA   Classification: 3 Mallampati/Airway Score: Two  Imaging: Dg Chest 2 View  Result Date: 07/14/2016 CLINICAL DATA:  Fatigue EXAM: CHEST  2 VIEW COMPARISON:  04/15/2016 FINDINGS: Cardiac shadow is within normal limits. Lungs are well aerated bilaterally. Right middle lobe infiltrate with volume loss is noted. Additionally there is a a rounded masslike density in the lateral aspect of the right measuring approximately 3 cm. Considerable right peritracheal and right hilar adenopathy is noted. These changes are highly suspicious for underlying neoplasm. Further evaluation by means of CT of the chest with contrast is recommended. No bony abnormality is seen. IMPRESSION: Changes consistent with a right middle lobe mass and central hilar and mediastinal adenopathy with partial right middle lobe collapse. This is consistent with neoplasm and CT evaluation is recommended. Electronically Signed   By: Inez Catalina M.D.   On: 07/14/2016 16:33   Ct Chest W Contrast  Result Date: 07/14/2016 CLINICAL DATA:  Right lung mass and right middle lobe collapse EXAM: CT CHEST, ABDOMEN, AND PELVIS WITH CONTRAST TECHNIQUE: Multidetector CT imaging of the chest, abdomen and pelvis was performed following the standard protocol during bolus administration of intravenous contrast. CONTRAST:  111m ISOVUE-300 IOPAMIDOL (ISOVUE-300) INJECTION 61% COMPARISON:  Chest x-ray from earlier in the same day. FINDINGS: CT CHEST FINDINGS Cardiovascular: The thoracic aorta shows a bovine it branching pattern. No aneurysmal dilatation or dissection is seen. No cardiac enlargement is noted. The pulmonary artery as visualized is within normal limits with the exception of attenuation of branches on the right secondary to hilar and mediastinal adenopathy. Mediastinum/Nodes: Thoracic inlet is within normal limits. Multiple mediastinal mobile masses are seen. One is noted in the anterior mediastinum measuring 5.8 x 2.9 cm. A second is noted anterior to the  carina measuring at least 5.1 by 5.8 cm. This mildly displaces the superior vena cava and causes mass effect upon the right main pulmonary artery. Subcarinal nodal mass is also noted measuring 4.5 by 3.4 cm in greatest dimension. Smaller right hilar nodes are seen. The largest of these measures 26 mm in short axis. The esophagus as visualize is within normal limits. Lungs/Pleura: The left lung demonstrates some emphysematous changes. No focal infiltrate or sizable effusion is seen. There is a 1.8 x 1.0 soft tissue density along the posteromedial aspect of the left lung. This appears to be within the chest wall as opposed to within the lung itself. The right lung also demonstrates significant emphysematous changes. There is a mass lesion which measures 2.8 by 3.0 cm in greatest transverse and AP dimension within the right middle lobe as seen on previous chest x-ray. Near complete collapse of the right middle lobe is seen secondary to central bronchial occlusion. Musculoskeletal: Degenerative changes of the thoracic spine are noted. No definitive lytic or sclerotic lesions are seen. CT ABDOMEN PELVIS FINDINGS Hepatobiliary: The liver demonstrates a focal hyperdense lesion likely representing a small hemangioma. The gallbladder is within normal  limits. Pancreas: Pancreas is within normal limits. Spleen: Normal in size without focal abnormality. Adrenals/Urinary Tract: The adrenal glands are unremarkable. The kidneys demonstrate normal enhancement bilaterally. Renal cyst is noted on the left. A few small nonobstructing calculi are noted in the right kidney. Mild scarring in the right kidney is noted as well. The bladder is partially distended. Stomach/Bowel: No obstructive or inflammatory changes are seen. The appendix is within normal limits. Vascular/Lymphatic: Aortic atherosclerosis. No enlarged abdominal or pelvic lymph nodes. Reproductive: The uterus is within normal limits and somewhat displaced to the right.  Ovarian cystic changes are seen bilaterally. Other: No abdominal wall hernia or abnormality. No abdominopelvic ascites. Musculoskeletal: In the left ischial tuberosity and extending superiorly into the posterior aspect of the left acetabulum, there is a lytic lesion with significant soft tissue component. It nearly completely destroyed is the ischial tuberosity and posterior elements of the right acetabulum. The soft tissue component extends inferiorly along the obturator foramen and measures approximately 12 by 8.6 cm in greatest dimension. It also extends superiorly along the left pelvic wall. IMPRESSION: Right middle lobe mass lesion with associated hilar and mediastinal adenopathy and near complete right middle lobe collapse. This is consistent with a primary pulmonary neoplasm with metastatic lesions till proven otherwise. PET-CT and tissue sampling are recommended for further evaluation. Small soft tissue nodule along the posterior costophrenic angle medially on the left. This may represent a small metastatic focus. Bony metastatic disease involving the ischial tuberosity on the left with large significant surrounding soft tissue component consistent with metastatic disease. Electronically Signed   By: Inez Catalina M.D.   On: 07/14/2016 18:10   Mr Lumbar Spine Wo Contrast  Result Date: 06/16/2016 CLINICAL DATA:  56 year old female with 2 months of lumbar back pain radiating to the left leg. No known injury. EXAM: MRI LUMBAR SPINE WITHOUT CONTRAST TECHNIQUE: Multiplanar, multisequence MR imaging of the lumbar spine was performed. No intravenous contrast was administered. COMPARISON:  [facility] radiographs 07/03/2013. FINDINGS: Segmentation: Lumbar segmentation appears to be normal and will be designated as such for this report. Alignment: Subtle anterolisthesis of L4 on L5. Otherwise within normal limits. Vertebrae: Degenerative appearing anterior endplate marrow edema at T12-L1. Underlying mildly to moderately  heterogeneous bone marrow signal but no other marrow edema or acute osseous abnormality. Visible sacrum is intact. Conus medullaris: Extends to the L1-L2 level and appears normal. Paraspinal and other soft tissues: Small simple left lower pole renal cyst. Visualized abdominal viscera and paraspinal soft tissues are within normal limits. Disc levels: T11-T12:  Negative. T12-L1: Disc space loss and right eccentric circumferential disc bulge. Mild spinal stenosis and right lateral recess stenosis (descending right L1 nerve root level series 5, image 3). Mild right T12 foraminal stenosis. L1-L2:  Mild circumferential disc bulge.  No stenosis. L2-L3: Mild far lateral disc bulging. Mild facet hypertrophy. No significant stenosis. L3-L4: Mild far lateral disc bulging. Mild facet and ligament flavum hypertrophy. No spinal or lateral recess stenosis. Borderline to mild bilateral L3 foraminal stenosis. L4-L5: Subtle anterolisthesis. Circumferential disc bulge with broad-based posterior component, but superimposed small left subarticular disc protrusion (series 2, image 10 and series 5, image 29). Severe facet and mild to moderate ligament flavum hypertrophy. Facet joint fluid on the right. Mild left greater than right lateral recess stenosis. No significant spinal stenosis. Mild to moderate left and mild right L4 foraminal stenosis. L5-S1: Mild disc bulge, endplate spurring and facet hypertrophy. No spinal or lateral recess stenosis. Mild bilateral L5 foraminal stenosis. IMPRESSION:  1. Symptomatic level favored to be L4-L5 where subtle anterolisthesis is associated with disc and severe facet degeneration. There is a small left subarticular disc herniation contributing to mild to moderate left foraminal stenosis and mild left lateral recess stenosis. Query left L4 and/or L5 radiculitis, respectively. 2. Mild spinal and right lateral recess stenosis at T12-L1. No mass effect on the lower thoracic spinal cord at that level. 3.  Otherwise no significant lumbar spinal stenosis. Disc bulging and facet degeneration elsewhere. Mild bilateral L3 and L5 neural foraminal stenosis. Electronically Signed   By: Genevie Ann M.D.   On: 06/16/2016 14:34   Ct Abdomen Pelvis W Contrast  Result Date: 07/14/2016 CLINICAL DATA:  Right lung mass and right middle lobe collapse EXAM: CT CHEST, ABDOMEN, AND PELVIS WITH CONTRAST TECHNIQUE: Multidetector CT imaging of the chest, abdomen and pelvis was performed following the standard protocol during bolus administration of intravenous contrast. CONTRAST:  139m ISOVUE-300 IOPAMIDOL (ISOVUE-300) INJECTION 61% COMPARISON:  Chest x-ray from earlier in the same day. FINDINGS: CT CHEST FINDINGS Cardiovascular: The thoracic aorta shows a bovine it branching pattern. No aneurysmal dilatation or dissection is seen. No cardiac enlargement is noted. The pulmonary artery as visualized is within normal limits with the exception of attenuation of branches on the right secondary to hilar and mediastinal adenopathy. Mediastinum/Nodes: Thoracic inlet is within normal limits. Multiple mediastinal mobile masses are seen. One is noted in the anterior mediastinum measuring 5.8 x 2.9 cm. A second is noted anterior to the carina measuring at least 5.1 by 5.8 cm. This mildly displaces the superior vena cava and causes mass effect upon the right main pulmonary artery. Subcarinal nodal mass is also noted measuring 4.5 by 3.4 cm in greatest dimension. Smaller right hilar nodes are seen. The largest of these measures 26 mm in short axis. The esophagus as visualize is within normal limits. Lungs/Pleura: The left lung demonstrates some emphysematous changes. No focal infiltrate or sizable effusion is seen. There is a 1.8 x 1.0 soft tissue density along the posteromedial aspect of the left lung. This appears to be within the chest wall as opposed to within the lung itself. The right lung also demonstrates significant emphysematous changes.  There is a mass lesion which measures 2.8 by 3.0 cm in greatest transverse and AP dimension within the right middle lobe as seen on previous chest x-ray. Near complete collapse of the right middle lobe is seen secondary to central bronchial occlusion. Musculoskeletal: Degenerative changes of the thoracic spine are noted. No definitive lytic or sclerotic lesions are seen. CT ABDOMEN PELVIS FINDINGS Hepatobiliary: The liver demonstrates a focal hyperdense lesion likely representing a small hemangioma. The gallbladder is within normal limits. Pancreas: Pancreas is within normal limits. Spleen: Normal in size without focal abnormality. Adrenals/Urinary Tract: The adrenal glands are unremarkable. The kidneys demonstrate normal enhancement bilaterally. Renal cyst is noted on the left. A few small nonobstructing calculi are noted in the right kidney. Mild scarring in the right kidney is noted as well. The bladder is partially distended. Stomach/Bowel: No obstructive or inflammatory changes are seen. The appendix is within normal limits. Vascular/Lymphatic: Aortic atherosclerosis. No enlarged abdominal or pelvic lymph nodes. Reproductive: The uterus is within normal limits and somewhat displaced to the right. Ovarian cystic changes are seen bilaterally. Other: No abdominal wall hernia or abnormality. No abdominopelvic ascites. Musculoskeletal: In the left ischial tuberosity and extending superiorly into the posterior aspect of the left acetabulum, there is a lytic lesion with significant soft tissue component.  It nearly completely destroyed is the ischial tuberosity and posterior elements of the right acetabulum. The soft tissue component extends inferiorly along the obturator foramen and measures approximately 12 by 8.6 cm in greatest dimension. It also extends superiorly along the left pelvic wall. IMPRESSION: Right middle lobe mass lesion with associated hilar and mediastinal adenopathy and near complete right middle  lobe collapse. This is consistent with a primary pulmonary neoplasm with metastatic lesions till proven otherwise. PET-CT and tissue sampling are recommended for further evaluation. Small soft tissue nodule along the posterior costophrenic angle medially on the left. This may represent a small metastatic focus. Bony metastatic disease involving the ischial tuberosity on the left with large significant surrounding soft tissue component consistent with metastatic disease. Electronically Signed   By: Inez Catalina M.D.   On: 07/14/2016 18:10    Labs:  CBC:  Recent Labs  04/15/16 0429 07/14/16 1612 07/15/16 1157 07/16/16 0440  WBC 8.9 22.4* 29.1* 31.1*  HGB 9.5* 7.9* 10.6* 10.4*  HCT 30.0* 25.9* 31.9* 32.0*  PLT 368 743* 660* 632*    COAGS:  Recent Labs  07/16/16 0440  INR 1.19    BMP:  Recent Labs  04/15/16 0429 07/14/16 1612 07/15/16 1157 07/16/16 0440  NA 143 142 139 140  K 3.5 2.5* 2.8* 3.9  CL 112* 104 104 106  CO2 '23 25 25 24  '$ GLUCOSE 111* 115* 120* 125*  BUN '10 12 7 12  '$ CALCIUM 8.7* 9.1 8.5* 8.7*  CREATININE 0.73 0.82 0.67 0.70  GFRNONAA >60 >60 >60 >60  GFRAA >60 >60 >60 >60    LIVER FUNCTION TESTS:  Recent Labs  04/15/16 0429 07/14/16 1612 07/16/16 0440  BILITOT 0.5 0.6 0.6  AST 24 14* 10*  ALT 17 11* 11*  ALKPHOS 75 174* 138*  PROT 7.5 8.1 7.1  ALBUMIN 3.3* 2.5* 2.3*    TUMOR MARKERS: No results for input(s): AFPTM, CEA, CA199, CHROMGRNA in the last 8760 hours.  Assessment and Plan: L ischial lesion Patient with several month history of hip pain thought to be related to sciatica is found to have metastatic cancer with L ischial lesion.  IR consulted for bone marrow biopsy at the request of Dr. Heath Lark. Patient is currently admitted with IV abx for post-obstructive pneumonia caused by her lung cancer.  She has had intermittent fevers, but none since last PM.  Her WBC has been persistently elevated since admission.  Overall, patient states  she is feeling better since admission.  Discussed with Dr. Vernard Gambles who is agreeable to proceed with biopsy today.  She has been NPO.  She is not on blood thinners.  Risks and benefits discussed with the patient including, but not limited to bleeding, infection, damage to adjacent structures or low yield requiring additional tests. All of the patient's questions were answered, patient is agreeable to proceed. Consent signed and in chart.   Thank you for this interesting consult.  I greatly enjoyed meeting Heather Hinton and look forward to participating in their care.  A copy of this report was sent to the requesting provider on this date.  Electronically Signed: Docia Barrier, PA 07/16/2016, 11:11 AM   I spent a total of 40 Minutes    in face to face in clinical consultation, greater than 50% of which was counseling/coordinating care for left ischial lesion.

## 2016-07-16 NOTE — Consult Note (Addendum)
Radiation Oncology         [phone] ________________________________  Initial Inpatient Consultation  Name: Leanah Kolander MRN: [id number]  Date:  07/16/16    DOB: [birth year]  DD:UKGURKY, Samul Dada, MD    REFERRING PHYSICIAN: Dr. Alvy Bimler  DIAGNOSIS: The primary encounter diagnosis was Hypokalemia. Diagnoses of Primary malignant neoplasm of right lung metastatic to other site [facility]), Symptomatic anemia, Fever, unspecified fever cause, Metastasis to bone (HCC), Guaiac positive stools, Weakness, Lung cancer ([place]), Cancer associated pain, Primary malignant neoplasm of lung metastatic to other site, unspecified laterality ([place]), Leukocytosis, unspecified type, and Left hip pain were also pertinent to this visit.    ICD-9-CM ICD-10-CM   1. Hypokalemia 276.8 E87.6   2. Primary malignant neoplasm of right lung metastatic to other site (HCC) 162.9 C34.91   3. Symptomatic anemia 285.9 D64.9   4. Fever, unspecified fever cause 780.60 R50.9   5. Metastasis to bone (HCC) 198.5 C79.51   6. Guaiac positive stools 792.1 R19.5   7. Weakness 780.79 R53.1 MR BRAIN W WO CONTRAST     MR BRAIN W WO CONTRAST  8. Lung cancer ([place]) 162.9 C34.90 CT BIOPSY     CT BIOPSY     CANCELED: IR Radiologist Eval & Mgmt     CANCELED: IR Radiologist Eval & Mgmt  9. Cancer associated pain 338.3 G89.3 morphine (MS CONTIN) 12 hr tablet 15 mg     dexamethasone (DECADRON) tablet 2 mg  10. Primary malignant neoplasm of lung metastatic to other site, unspecified laterality (HCC) 162.9 C34.90 morphine (MS CONTIN) 12 hr tablet 15 mg  11. Leukocytosis, unspecified type 288.60 D72.829   12. Left hip pain 719.45 M25.552     HISTORY OF PRESENT ILLNESS: Arlesia Kiel is a 56 y.o. female seen at the request of Dr. Alvy Bimler for a new diagnosis of presumed stage IV lung cancer. The patient reports that she has had many months of low left back/hip pain that was being evaluated and treated in orthopedics. She states that she was given  multiple courses of the medicine, but was frustrated that she didn't feel as though there was a definitive diagnosis made. She states that her pain continued and the last few months has lost about 20-30 pounds, she's been unable to lay or sit on her left hip last year, and mildly was short of breath with exertion. An MRI of her lumbar spine from 06/16/16 revealed distant severe facet degeneration, and hip x-ray from 05/03/16 was negative. She presented however to the ED on 07/14/16 and was found to have a large 5.8 x 2.9 cm mass in the anterior mediastinum and a second in the carina measuring 5.1 x 5.8 cm. This mildly displaces the SVC and causes mass effect on the right main pulmonary artery, a 4.5 x 3 point or centimeter subcarinal nodal mass was noted and smaller right hilar nodes were also identified the largest being 2.6 cm. There is a 1.8 x 1 cm soft tissue density along the posterior medial aspect of the left lung, and in the right middle lobe a 2.8 x 3 cm mass with near complete collapse of the right middle lobe. In the left ischial tuberosity extending superiorly to the posterior aspect of the left acetabulum there is a lytic lesion with significant soft tissue component extending inferiorly along the obturator foramen measuring 12 x 8.6 cm also extending superiorly along the left pelvic sidewall. We are asked to see the patient to discuss the role of palliative radiotherapy to  the chest and left hip.      PREVIOUS RADIATION THERAPY: No  PAST MEDICAL HISTORY:  Past Medical History:  Diagnosis Date  . Hypertension   . Spinal stenosis       PAST SURGICAL HISTORY: Past Surgical History:  Procedure Laterality Date  . TUBAL LIGATION      FAMILY HISTORY:  Family History  Problem Relation Age of Onset  . Hypertension Mother   . Diabetes Mother   . Breast cancer Mother   . Hypertension Father   . Hypertension Sister   . Pancreatic cancer Sister   . Lung cancer Maternal Grandmother      SOCIAL HISTORY:  Social History   Social History  . Marital status: Legally Separated    Spouse name: N/A  . Number of children: 58  . Years of education: N/A   Occupational History  . Unemployed    Social History Main Topics  . Smoking status: Former Smoker    Packs/day: 0.50    Years: 40.00    Types: Cigarettes    Quit date: 10/2015  . Smokeless tobacco: Never Used  . Alcohol use No  . Drug use: No  . Sexual activity: Yes   Other Topics Concern  . Not on file   Social History Narrative  . No narrative on file  The patient is married and resides in [place]. Her son accompanies her. 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  ALLERGIES: Iron  MEDICATIONS:  Current Facility-Administered Medications  Medication Dose Route Frequency Provider Last Rate Last Dose  . 0.9 %  sodium chloride infusion   Intravenous Once Karmen Bongo, MD      . 0.9 % NaCl with KCl 40 mEq / L  infusion   Intravenous Continuous Karmen Bongo, MD 100 mL/hr at 07/16/16 0144 100 mL/hr at 07/16/16 0144  . acetaminophen (TYLENOL) tablet 650 mg  650 mg Oral Q6H PRN Karmen Bongo, MD   650 mg at 07/15/16 2107   Or  . acetaminophen (TYLENOL) suppository 650 mg  650 mg Rectal Q6H PRN Karmen Bongo, MD      . amLODipine (NORVASC) tablet 10 mg  10 mg Oral Ivery Quale, MD   10 mg at 07/15/16 2107  . dexamethasone (DECADRON) tablet 2 mg  2 mg Oral Q12H Gorsuch, Ni, MD   2 mg at 07/16/16 0830  . docusate sodium (COLACE) capsule 100 mg  100 mg Oral BID Karmen Bongo, MD   100 mg at 07/16/16 0829  . gabapentin (NEURONTIN) capsule 300 mg  300 mg Oral BID Karmen Bongo, MD   300 mg at 07/16/16 0829  . HYDROcodone-acetaminophen (NORCO/VICODIN) 5-325 MG per tablet 1 tablet  1 tablet Oral Q4H PRN Caren Griffins, MD      . methocarbamol (ROBAXIN) 500 mg in dextrose 5 % 50 mL IVPB  500 mg Intravenous Q6H PRN Karmen Bongo, MD 110  mL/hr at 07/15/16 0019 500 mg at 07/15/16 0019  . morphine (MS CONTIN) 12 hr tablet 15 mg  15 mg Oral Q12H Alvy Bimler, Ni, MD   15 mg at 07/16/16 0829  . morphine 2 MG/ML injection 2 mg  2 mg Intravenous Q2H PRN Karmen Bongo, MD   2 mg at 07/15/16 1111  . ondansetron (ZOFRAN) tablet 4 mg  4 mg Oral Q6H PRN Karmen Bongo, MD   4 mg at 07/15/16 0453   Or  . ondansetron [facility]) injection  4 mg  4 mg Intravenous Q6H PRN Karmen Bongo, MD      . piperacillin-tazobactam (ZOSYN) IVPB 3.375 g  3.375 g Intravenous Q8H Adrian Saran, RPH 12.5 mL/hr at 07/16/16 0830 3.375 g at 07/16/16 0830  . polyethylene glycol (MIRALAX / GLYCOLAX) packet 17 g  17 g Oral Daily Bullard, Elray Mcgregor, NP   17 g at 07/16/16 0830  . pravastatin (PRAVACHOL) tablet 20 mg  20 mg Oral q1800 Karmen Bongo, MD   20 mg at 07/15/16 1719    REVIEW OF SYSTEMS:  On review of systems, the patient reports that she is doing okay. She is surprised by the new concerns for a diagnosis of lung cancer, she states that she had lost upwards of 30 pounds in the last 6 months, and reports that she has had intermittent episodes of shortness of breath with exertion and cough without hemoptysis or productive changes. She did start having chills and night sweats in the last week before presenting to the emergency room. She reports that her bowels have been very constipated as of late and this is unusual for her. She denies any upper extremity edema, or chest pain/tachycardia/palpitations. She denies any  bladder disturbances, and denies abdominal pain, nausea or vomiting. She has had a poor appetite and feels quite tired. She denies any changes in sensation in her lower extremities, but states that it is difficult to put weight on her left leg due to pain. She denies any other musculoskeletal or joint aches or pains. A complete review of systems is obtained and is otherwise negative.    PHYSICAL EXAM:  Wt Readings from Last 3 Encounters:  07/14/16  153 lb (69.4 kg)  06/05/16 148 lb (67.1 kg)  05/24/16 148 lb (67.1 kg)   Temp Readings from Last 3 Encounters:  07/16/16 98.1 F (36.7 C) (Oral)  06/05/16 99.4 F (37.4 C) (Oral)  05/24/16 97.5 F (36.4 C)   BP Readings from Last 3 Encounters:  07/16/16 122/76  06/05/16 124/86  05/24/16 135/89   Pulse Readings from Last 3 Encounters:  07/16/16 88  06/05/16 (!) 103  05/24/16 (!) 106   Pain Assessment Pain Score: 4 /10  In general this is a well appearing African American female in no acute distress. She is alert and oriented x4 and appropriate throughout the examination. HEENT reveals that the patient is normocephalic, atraumatic. EOMs are intact. PERRLA. Skin is intact without any evidence of gross lesions. Cardiovascular exam reveals a regular rate and rhythm, no clicks rubs or murmurs are auscultated. Chest is clear to auscultation bilaterally. Lymphatic assessment is performed and does not reveal any adenopathy in the cervical, supraclavicular, axillary, or inguinal chains. Abdomen has active bowel sounds in all quadrants and is intact. The abdomen is soft, non tender, non distended. Lower extremities are negative for pretibial pitting edema, deep calf tenderness, cyanosis or clubbing. Palpation of the left hip and posterior buttock, reveals thickening of the skin and palpable tumor within the hip and posteriorly. No tumor is noted to erode through the skin. She has 4 out of 5 strength of the left lower extremity and 5 out of 5 on the right with intact sensation bilaterally to soft touch.   KPS = 50  100 - Normal; no complaints; no evidence of disease. 90   - Able to carry on normal activity; minor signs or symptoms of disease. 80   - Normal activity with effort; some signs or symptoms of disease. 70   -  Cares for self; unable to carry on normal activity or to do active work. 60   - Requires occasional assistance, but is able to care for most of his personal needs. 50   -  Requires considerable assistance and frequent medical care. 48   - Disabled; requires special care and assistance. 4   - Severely disabled; hospital admission is indicated although death not imminent. 65   - Very sick; hospital admission necessary; active supportive treatment necessary. 10   - Moribund; fatal processes progressing rapidly. 0     - Dead  Karnofsky DA, Abelmann Carlton, Craver LS and Burchenal [facility] [phone]) The use of the nitrogen mustards in the palliative treatment of carcinoma: with particular reference to bronchogenic carcinoma Cancer 1 634-56  LABORATORY DATA:  Lab Results  Component Value Date   WBC 31.1 (H) 07/16/2016   HGB 10.4 (L) 07/16/2016   HCT 32.0 (L) 07/16/2016   MCV 78.4 07/16/2016   PLT 632 (H) 07/16/2016   Lab Results  Component Value Date   NA 140 07/16/2016   K 3.9 07/16/2016   CL 106 07/16/2016   CO2 24 07/16/2016   Lab Results  Component Value Date   ALT 11 (L) 07/16/2016   AST 10 (L) 07/16/2016   ALKPHOS 138 (H) 07/16/2016   BILITOT 0.6 07/16/2016     RADIOGRAPHY: Dg Chest 2 View  Result Date: 07/14/2016 CLINICAL DATA:  Fatigue EXAM: CHEST  2 VIEW COMPARISON:  04/15/2016 FINDINGS: Cardiac shadow is within normal limits. Lungs are well aerated bilaterally. Right middle lobe infiltrate with volume loss is noted. Additionally there is a a rounded masslike density in the lateral aspect of the right measuring approximately 3 cm. Considerable right peritracheal and right hilar adenopathy is noted. These changes are highly suspicious for underlying neoplasm. Further evaluation by means of CT of the chest with contrast is recommended. No bony abnormality is seen. IMPRESSION: Changes consistent with a right middle lobe mass and central hilar and mediastinal adenopathy with partial right middle lobe collapse. This is consistent with neoplasm and CT evaluation is recommended. Electronically Signed   By: Inez Catalina M.D.   On: 07/14/2016 16:33   Ct Chest W  Contrast  Result Date: 07/14/2016 CLINICAL DATA:  Right lung mass and right middle lobe collapse EXAM: CT CHEST, ABDOMEN, AND PELVIS WITH CONTRAST TECHNIQUE: Multidetector CT imaging of the chest, abdomen and pelvis was performed following the standard protocol during bolus administration of intravenous contrast. CONTRAST:  149mL ISOVUE-300 IOPAMIDOL (ISOVUE-300) INJECTION 61% COMPARISON:  Chest x-ray from earlier in the same day. FINDINGS: CT CHEST FINDINGS Cardiovascular: The thoracic aorta shows a bovine it branching pattern. No aneurysmal dilatation or dissection is seen. No cardiac enlargement is noted. The pulmonary artery as visualized is within normal limits with the exception of attenuation of branches on the right secondary to hilar and mediastinal adenopathy. Mediastinum/Nodes: Thoracic inlet is within normal limits. Multiple mediastinal mobile masses are seen. One is noted in the anterior mediastinum measuring 5.8 x 2.9 cm. A second is noted anterior to the carina measuring at least 5.1 by 5.8 cm. This mildly displaces the superior vena cava and causes mass effect upon the right main pulmonary artery. Subcarinal nodal mass is also noted measuring 4.5 by 3.4 cm in greatest dimension. Smaller right hilar nodes are seen. The largest of these measures 26 mm in short axis. The esophagus as visualize is within normal limits. Lungs/Pleura: The left lung demonstrates some emphysematous changes. No focal infiltrate or  sizable effusion is seen. There is a 1.8 x 1.0 soft tissue density along the posteromedial aspect of the left lung. This appears to be within the chest wall as opposed to within the lung itself. The right lung also demonstrates significant emphysematous changes. There is a mass lesion which measures 2.8 by 3.0 cm in greatest transverse and AP dimension within the right middle lobe as seen on previous chest x-ray. Near complete collapse of the right middle lobe is seen secondary to central bronchial  occlusion. Musculoskeletal: Degenerative changes of the thoracic spine are noted. No definitive lytic or sclerotic lesions are seen. CT ABDOMEN PELVIS FINDINGS Hepatobiliary: The liver demonstrates a focal hyperdense lesion likely representing a small hemangioma. The gallbladder is within normal limits. Pancreas: Pancreas is within normal limits. Spleen: Normal in size without focal abnormality. Adrenals/Urinary Tract: The adrenal glands are unremarkable. The kidneys demonstrate normal enhancement bilaterally. Renal cyst is noted on the left. A few small nonobstructing calculi are noted in the right kidney. Mild scarring in the right kidney is noted as well. The bladder is partially distended. Stomach/Bowel: No obstructive or inflammatory changes are seen. The appendix is within normal limits. Vascular/Lymphatic: Aortic atherosclerosis. No enlarged abdominal or pelvic lymph nodes. Reproductive: The uterus is within normal limits and somewhat displaced to the right. Ovarian cystic changes are seen bilaterally. Other: No abdominal wall hernia or abnormality. No abdominopelvic ascites. Musculoskeletal: In the left ischial tuberosity and extending superiorly into the posterior aspect of the left acetabulum, there is a lytic lesion with significant soft tissue component. It nearly completely destroyed is the ischial tuberosity and posterior elements of the right acetabulum. The soft tissue component extends inferiorly along the obturator foramen and measures approximately 12 by 8.6 cm in greatest dimension. It also extends superiorly along the left pelvic wall. IMPRESSION: Right middle lobe mass lesion with associated hilar and mediastinal adenopathy and near complete right middle lobe collapse. This is consistent with a primary pulmonary neoplasm with metastatic lesions till proven otherwise. PET-CT and tissue sampling are recommended for further evaluation. Small soft tissue nodule along the posterior costophrenic  angle medially on the left. This may represent a small metastatic focus. Bony metastatic disease involving the ischial tuberosity on the left with large significant surrounding soft tissue component consistent with metastatic disease. Electronically Signed   By: Inez Catalina M.D.   On: 07/14/2016 18:10   Ct Abdomen Pelvis W Contrast  Result Date: 07/14/2016 CLINICAL DATA:  Right lung mass and right middle lobe collapse EXAM: CT CHEST, ABDOMEN, AND PELVIS WITH CONTRAST TECHNIQUE: Multidetector CT imaging of the chest, abdomen and pelvis was performed following the standard protocol during bolus administration of intravenous contrast. CONTRAST:  132mL ISOVUE-300 IOPAMIDOL (ISOVUE-300) INJECTION 61% COMPARISON:  Chest x-ray from earlier in the same day. FINDINGS: CT CHEST FINDINGS Cardiovascular: The thoracic aorta shows a bovine it branching pattern. No aneurysmal dilatation or dissection is seen. No cardiac enlargement is noted. The pulmonary artery as visualized is within normal limits with the exception of attenuation of branches on the right secondary to hilar and mediastinal adenopathy. Mediastinum/Nodes: Thoracic inlet is within normal limits. Multiple mediastinal mobile masses are seen. One is noted in the anterior mediastinum measuring 5.8 x 2.9 cm. A second is noted anterior to the carina measuring at least 5.1 by 5.8 cm. This mildly displaces the superior vena cava and causes mass effect upon the right main pulmonary artery. Subcarinal nodal mass is also noted measuring 4.5 by 3.4 cm in greatest  dimension. Smaller right hilar nodes are seen. The largest of these measures 26 mm in short axis. The esophagus as visualize is within normal limits. Lungs/Pleura: The left lung demonstrates some emphysematous changes. No focal infiltrate or sizable effusion is seen. There is a 1.8 x 1.0 soft tissue density along the posteromedial aspect of the left lung. This appears to be within the chest wall as opposed to  within the lung itself. The right lung also demonstrates significant emphysematous changes. There is a mass lesion which measures 2.8 by 3.0 cm in greatest transverse and AP dimension within the right middle lobe as seen on previous chest x-ray. Near complete collapse of the right middle lobe is seen secondary to central bronchial occlusion. Musculoskeletal: Degenerative changes of the thoracic spine are noted. No definitive lytic or sclerotic lesions are seen. CT ABDOMEN PELVIS FINDINGS Hepatobiliary: The liver demonstrates a focal hyperdense lesion likely representing a small hemangioma. The gallbladder is within normal limits. Pancreas: Pancreas is within normal limits. Spleen: Normal in size without focal abnormality. Adrenals/Urinary Tract: The adrenal glands are unremarkable. The kidneys demonstrate normal enhancement bilaterally. Renal cyst is noted on the left. A few small nonobstructing calculi are noted in the right kidney. Mild scarring in the right kidney is noted as well. The bladder is partially distended. Stomach/Bowel: No obstructive or inflammatory changes are seen. The appendix is within normal limits. Vascular/Lymphatic: Aortic atherosclerosis. No enlarged abdominal or pelvic lymph nodes. Reproductive: The uterus is within normal limits and somewhat displaced to the right. Ovarian cystic changes are seen bilaterally. Other: No abdominal wall hernia or abnormality. No abdominopelvic ascites. Musculoskeletal: In the left ischial tuberosity and extending superiorly into the posterior aspect of the left acetabulum, there is a lytic lesion with significant soft tissue component. It nearly completely destroyed is the ischial tuberosity and posterior elements of the right acetabulum. The soft tissue component extends inferiorly along the obturator foramen and measures approximately 12 by 8.6 cm in greatest dimension. It also extends superiorly along the left pelvic wall. IMPRESSION: Right middle lobe  mass lesion with associated hilar and mediastinal adenopathy and near complete right middle lobe collapse. This is consistent with a primary pulmonary neoplasm with metastatic lesions till proven otherwise. PET-CT and tissue sampling are recommended for further evaluation. Small soft tissue nodule along the posterior costophrenic angle medially on the left. This may represent a small metastatic focus. Bony metastatic disease involving the ischial tuberosity on the left with large significant surrounding soft tissue component consistent with metastatic disease. Electronically Signed   By: Inez Catalina M.D.   On: 07/14/2016 18:10      IMPRESSION/PLAN: 1. 56 y.o. 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 I spent time with the patient and her son and reviewed the imaging findings to date and showed her the images. We discussed the recommendations for proceeding with biopsy and MRI as this is most likely a metastatic lung cancer. We reviewed the role and utility for radiotherapy and outlined that we would offer her a course of about 2 weeks to the left hip and right chest/mediastinum. We discussed precautions as well and do not see evidence of acute SVC syndrome by examination. We reviewed the risks, benefits, short, and long term effects of radiotherapy as well as delivery and logistics. She is in agreement to proceed and written consent is obtained. A copy will be provided to the patient. She will simulate  today at 2pm. She has an MRI brain following this and we will take her to the department for this. We anticipate starting radiation tomorrow.   In a visit lasting 70 minutes, greater than 50% of the time was spent face to face and in floor time communicating between her care team, and coordinating the patient's care.     Carola Rhine, PAC Seen with _____________________________________  Sheral Apley Tammi Klippel, M.D.

## 2016-07-16 NOTE — Progress Notes (Signed)
[place] site CD and I

## 2016-07-16 NOTE — Progress Notes (Signed)
  Radiation Oncology         [phone] ________________________________  Name: Cortny Bambach MRN: [id number]  Date: 07/16/2016  DOB: 1960/05/12  SIMULATION AND TREATMENT PLANNING NOTE    ICD-9-CM ICD-10-CM   1. Metastasis to bone (HCC) 198.5 C79.51   2. Metastatic lung cancer (metastasis from lung to other site), unspecified laterality ([place]) 162.9 C34.90     DIAGNOSIS:  56 yo woman with right upper lung cancer and painful left hip metastasis  NARRATIVE:  The patient was brought to the [place].  Identity was confirmed.  All relevant records and images related to the planned course of therapy were reviewed.  The patient freely provided informed written consent to proceed with treatment after reviewing the details related to the planned course of therapy. The consent form was witnessed and verified by the simulation staff.  Then, the patient was set-up in a stable reproducible  supine position for radiation therapy.  CT images were obtained.  Surface markings were placed.  The CT images were loaded into the planning software.  Then the target and avoidance structures were contoured.  Treatment planning then occurred.  The radiation prescription was entered and confirmed.  Then, I designed and supervised the construction of a total of 5 medically necessary complex treatment devices including bodyFix and MLCs.  I have requested : 3D Simulation  I have requested a DVH of the following structures: Rt lung, Lt lung, heart, spinal cord and targets.    PLAN:  The patient will receive 30 Gy in 10 fractions to the chest and left hip for palliation.  ________________________________  Sheral Apley Tammi Klippel, M.D.  This document serves as a record of services personally performed by Tyler Pita, MD. It was created on his behalf by Bethann Humble, a trained medical scribe. The creation of this record is based on the scribe's personal observations and the provider's statements to them. This  document has been checked and approved by the attending provider.

## 2016-07-16 NOTE — Procedures (Signed)
CT biopsy L ischial mass 11g x multiple to surg path No complication No blood loss. See complete dictation in [facility].   Dillard Cannon MD Main # [phone] Pager  [phone]

## 2016-07-16 NOTE — Progress Notes (Signed)
Daily Progress Note   Patient Name: Heather Hinton       Date: 07/16/2016 DOB: September 16, 1960  Age: 56 y.o. 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  Subjective: Awake alert, pain is well controlled, in good spirits, resting in bed, has to be lying on her R side, so as to avoid placing pressure on her L hip area.  See below  Length of Stay: 2  Current Medications: Scheduled Meds:  . amLODipine  10 mg Oral QHS  . dexamethasone  2 mg Oral Q12H  . docusate sodium  100 mg Oral BID  . gabapentin  300 mg Oral BID  . morphine  15 mg Oral Q12H  . polyethylene glycol  17 g Oral Daily  . pravastatin  20 mg Oral q1800    Continuous Infusions: . sodium chloride    . 0.9 % NaCl with KCl 40 mEq / L 100 mL/hr (07/16/16 0144)  . methocarbamol (ROBAXIN)  IV 500 mg (07/15/16 0019)  . piperacillin-tazobactam (ZOSYN)  IV 3.375 g (07/16/16 0830)    PRN Meds: acetaminophen **OR** acetaminophen, HYDROcodone-acetaminophen, methocarbamol (ROBAXIN)  IV, morphine injection, ondansetron **OR** ondansetron (ZOFRAN) IV  Physical Exam         Awake alert resting in bed Lying with L hip up S1 S2 Clear Abdomen soft No edema Non focal  Vital Signs: BP 122/76 (BP Location: Left Arm)   Pulse 88   Temp 98.1 F (36.7 C) (Oral)   Resp 18   Ht 5\' 7"  (1.702 m)   Wt 69.4 kg (153 lb)   SpO2 99%   BMI 23.96 kg/m  SpO2: SpO2: 99 % O2 Device: O2 Device: Not Delivered O2 Flow Rate:    Intake/output summary:  Intake/Output Summary (Last 24 hours) at 07/16/16  1034 Last data filed at 07/16/16 0643  Gross per 24 hour  Intake          1938.33 ml  Output             1050 ml  Net           888.33 ml   LBM: Last BM  Date: 07/15/16 Baseline Weight: Weight: 69.4 kg (153 lb) Most recent weight: Weight: 69.4 kg (153 lb)       Palliative Assessment/Data:    Flowsheet Rows     Most Recent Value  Intake Tab  Referral Department  Hospitalist  Unit at Time of Referral  Oncology Unit  Palliative Care Primary Diagnosis  Cancer  Date Notified  07/15/16  Palliative Care Type  New Palliative care  Reason for referral  Pain  Date of Admission  07/14/16  Date first seen by Palliative Care  07/15/16  # of days Palliative referral response time  0 Day(s)  # of days IP prior to Palliative referral  1  Clinical Assessment  Palliative Performance Scale Score  50%  Pain Max last 24 hours  10  Pain Min Last 24 hours  6  Dyspnea Max Last 24 Hours  0  Dyspnea Min Last 24 hours  0  Nausea Max Last 24 Hours  2  Nausea Min Last 24 Hours  0  Anxiety Max Last 24 Hours  0  Anxiety Min Last 24 Hours  0  Other Max Last 24 Hours  0  Psychosocial & Spiritual Assessment  Palliative Care Outcomes  Patient/Family meeting held?  Yes  Who was at the meeting?  pt and multiple family members  Palliative Care Outcomes  Improved pain interventions  Palliative Care follow-up planned  Yes, Facility      Patient Active Problem List   Diagnosis Date Noted  . 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 Metastasis to bone ([place])   . Left hip pain   . Palliative care by specialist   . Metastatic lung cancer (metastasis from lung to other site) ([place]) 07/14/2016  . Essential hypertension 07/14/2016  . Hypokalemia 07/14/2016  . Hyperglycemia 07/14/2016  . Symptomatic anemia 07/14/2016  . Thrombocytosis ([place]) 07/14/2016    Palliative Care Assessment & Plan   Patient Profile:    Assessment:  Diffuse metastatic cancer, suspect primary lung cancer with metastatic cancer to  the bone Uncontrolled pain  Recommendations/Plan:   continue current pain regimen, monitor for increasing pain meds needs.   Awaiting biopsy results.   Await MRI  Agree with current bowel and anti emetic regimen  Continue to support the patient holistically.   Goals of Care and Additional Recommendations:  Limitations on Scope of Treatment: Full Scope Treatment Brief life review: Heather Hinton is from [place], [place], her 57 year old grand daughter lives with her, Heather Hinton is happy today, as her 62 year old grand daughter is graduating 7th grade today, with good grades and perfect attendance. Heather Hinton states that her faith is a great source of support and strength for her, " you know, God's got His Hand over me." provided her with active listening and supportive care.   Code Status:    Code Status Orders        Start     Ordered   07/14/16 2325  Full code  Continuous     07/14/16 2324    Code Status History    Date Active Date Inactive Code Status Order ID Comments User Context   This patient has a current code status but no historical code status.       Prognosis:   Unable to determine  Discharge Planning:  To Be Determined  Care plan was discussed with patient, RN, Dr Heather Hinton.    Thank you for allowing the Palliative Medicine Team to assist in the care of this  patient.   Time In:  10 Time Out: 10.25 Total Time 25 Prolonged Time Billed  no       Greater than 50%  of this time was spent counseling and coordinating care related to the above assessment and plan.  Heather Chance, MD [phone]  Please contact Palliative Medicine Team phone at [phone] for questions and concerns.

## 2016-07-17 ENCOUNTER — Ambulatory Visit
Admit: 2016-07-17 | Discharge: 2016-07-17 | Disposition: A | Discharge status: Home / Self Care | Payer: Medicaid Other | Attending: Radiation Oncology | Admitting: Radiation Oncology

## 2016-07-17 DIAGNOSIS — Z510 Encounter for antineoplastic radiation therapy: Secondary | ICD-10-CM | POA: Diagnosis not present

## 2016-07-17 DIAGNOSIS — D649 Anemia, unspecified: Secondary | ICD-10-CM

## 2016-07-17 DIAGNOSIS — D509 Iron deficiency anemia, unspecified: Secondary | ICD-10-CM

## 2016-07-17 DIAGNOSIS — M899 Disorder of bone, unspecified: Secondary | ICD-10-CM

## 2016-07-17 DIAGNOSIS — K5903 Drug induced constipation: Secondary | ICD-10-CM

## 2016-07-17 DIAGNOSIS — C3491 Malignant neoplasm of unspecified part of right bronchus or lung: Secondary | ICD-10-CM

## 2016-07-17 DIAGNOSIS — M25552 Pain in left hip: Secondary | ICD-10-CM

## 2016-07-17 DIAGNOSIS — C7931 Secondary malignant neoplasm of brain: Secondary | ICD-10-CM

## 2016-07-17 DIAGNOSIS — T402X5A Adverse effect of other opioids, initial encounter: Secondary | ICD-10-CM

## 2016-07-17 LAB — BASIC METABOLIC PANEL
Anion gap: 10 (ref 5–15)
BUN: 19 mg/dL (ref 6–20)
CO2: 23 mmol/L (ref 22–32)
Calcium: 8.5 mg/dL — ABNORMAL LOW (ref 8.9–10.3)
Chloride: 106 mmol/L (ref 101–111)
Creatinine, Ser: 0.89 mg/dL (ref 0.44–1.00)
GFR calc Af Amer: >60 mL/min (ref >60)
GLUCOSE: 133 mg/dL — AB (ref 65–99)
POTASSIUM: 4.1 mmol/L (ref 3.5–5.1)
Sodium: 139 mmol/L (ref 135–145)

## 2016-07-17 LAB — CBC
HCT: 32.1 % — ABNORMAL LOW (ref 36.0–46.0)
Hemoglobin: 10.2 g/dL — ABNORMAL LOW (ref 12.0–15.0)
MCH: 25.1 pg — AB (ref 26.0–34.0)
MCHC: 31.8 g/dL (ref 30.0–36.0)
MCV: 78.9 fL (ref 78.0–100.0)
PLATELETS: 624 10*3/uL — AB (ref 150–400)
RBC: 4.07 MIL/uL (ref 3.87–5.11)
RDW: 15.7 % — AB (ref 11.5–15.5)
WBC: 32.8 10*3/uL — ABNORMAL HIGH (ref 4.0–10.5)

## 2016-07-17 MED ORDER — SODIUM CHLORIDE 0.9 % IV SOLN
510.0000 mg | Freq: Once | INTRAVENOUS | Status: AC
  Administered 2016-07-17: 510 mg via INTRAVENOUS
  Filled 2016-07-17: qty 17

## 2016-07-17 MED ORDER — DEXAMETHASONE SODIUM PHOSPHATE 4 MG/ML IJ SOLN
4.0000 mg | Freq: Three times a day (TID) | INTRAMUSCULAR | Status: DC
Start: 2016-07-17 — End: 2016-07-22
  Administered 2016-07-17 – 2016-07-22 (×15): 4 mg via INTRAVENOUS
  Filled 2016-07-17 (×16): qty 1

## 2016-07-17 MED ORDER — AMOXICILLIN-POT CLAVULANATE 875-125 MG PO TABS
1.0000 | ORAL_TABLET | Freq: Two times a day (BID) | ORAL | Status: DC
  Administered 2016-07-17 – 2016-07-23 (×12): 1 via ORAL
  Filled 2016-07-17 (×13): qty 1

## 2016-07-17 MED ORDER — SENNOSIDES-DOCUSATE SODIUM 8.6-50 MG PO TABS
2.0000 | ORAL_TABLET | Freq: Two times a day (BID) | ORAL | Status: DC
  Administered 2016-07-17 – 2016-07-25 (×10): 2 via ORAL
  Filled 2016-07-17 (×14): qty 2

## 2016-07-17 MED ORDER — ENOXAPARIN SODIUM 40 MG/0.4ML ~~LOC~~ SOLN
40.0000 mg | SUBCUTANEOUS | Status: DC
  Administered 2016-07-17 – 2016-07-25 (×9): 40 mg via SUBCUTANEOUS
  Filled 2016-07-17 (×9): qty 0.4

## 2016-07-17 NOTE — Progress Notes (Signed)
Daily Progress Note   Patient Name: Heather Hinton       Date: 07/17/2016 DOB: 1960-10-15  Age: 56 y.o. 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  Subjective: Awake alert, pain is well controlled, in good spirits, resting in bed, has to be lying on her R side, so as to avoid placing pressure on her L hip area.  See below  Length of Stay: 3  Current Medications: Scheduled Meds:  . amLODipine  10 mg Oral QHS  . dexamethasone  4 mg Intravenous TID  . docusate sodium  100 mg Oral BID  . enoxaparin (LOVENOX) injection  40 mg Subcutaneous Q24H  . gabapentin  300 mg Oral BID  . morphine  15 mg Oral Q12H  . polyethylene glycol  17 g Oral Daily  . pravastatin  20 mg Oral q1800    Continuous Infusions: . sodium chloride    . 0.9 % NaCl with KCl 40 mEq / L 100 mL/hr (07/16/16 0144)  . methocarbamol (ROBAXIN)  IV 500 mg (07/15/16 0019)  . piperacillin-tazobactam (ZOSYN)  IV 3.375 g (07/17/16 0936)    PRN Meds: acetaminophen **OR** acetaminophen, HYDROcodone-acetaminophen, methocarbamol (ROBAXIN)  IV, morphine injection, ondansetron **OR** ondansetron (ZOFRAN) IV  Physical Exam         Awake alert resting in bed Lying with L hip up S1 S2 Clear Abdomen soft No edema Non focal  Vital Signs: BP 135/83 (BP Location: Left Arm)   Pulse 100   Temp 97.6 F (36.4 C) (Oral)   Resp 20   Ht 5\' 7"  (1.702 m)   Wt 69.4 kg (153 lb)   SpO2 100%   BMI 23.96 kg/m  SpO2: SpO2: 100 % O2 Device: O2 Device: Not Delivered O2 Flow Rate: O2 Flow Rate  (L/min): 2 L/min  Intake/output summary:   Intake/Output Summary (Last 24 hours) at 07/17/16 1035 Last data filed at 07/17/16 0951  Gross per 24 hour  Intake              350 ml  Output             1300 ml  Net             -950 ml   LBM: Last BM Date: 07/15/16 Baseline Weight: Weight: 69.4 kg (153 lb) Most recent weight: Weight: 69.4 kg (153 lb)       Palliative Assessment/Data:    Flowsheet Rows     Most Recent Value  Intake Tab  Referral Department  Hospitalist  Unit at Time of Referral  Oncology Unit  Palliative Care Primary Diagnosis  Cancer  Date Notified  07/15/16  Palliative Care Type  New Palliative care  Reason for referral  Pain  Date of Admission  07/14/16  Date first seen by Palliative Care  07/15/16  # of days Palliative referral response time  0 Day(s)  # of days IP prior to Palliative referral  1  Clinical Assessment  Palliative Performance Scale Score  50%  Pain Max last 24 hours  10  Pain Min Last 24 hours  6  Dyspnea Max Last 24 Hours  0  Dyspnea Min Last 24 hours  0  Nausea Max Last 24 Hours  2  Nausea Min Last 24 Hours  0  Anxiety Max Last 24 Hours  0  Anxiety Min Last 24 Hours  0  Other Max Last 24 Hours  0  Psychosocial & Spiritual Assessment  Palliative Care Outcomes  Patient/Family meeting held?  Yes  Who was at the meeting?  pt and multiple family members  Palliative Care Outcomes  Improved pain interventions  Palliative Care follow-up planned  Yes, Facility      Patient Active Problem List   Diagnosis Date Noted  . 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 Metastasis to bone ([place])   . Left hip pain   . Palliative care by specialist   . Metastatic lung cancer (metastasis from lung to other site) ([place]) 07/14/2016  . Essential hypertension 07/14/2016  . Hypokalemia 07/14/2016  . Hyperglycemia 07/14/2016  . Symptomatic anemia 07/14/2016  . 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  Continue current pain regimen, monitor for increasing pain meds needs. Discussed use of pain medication prior to activities that cause exacerbation of pain, such as lying on table for radiation.  Awaiting biopsy results.   Continue anti emetic regimen  CnstipationL opioid related.  Add senna. Continue miralax. ? Enema if no result in AM.  Continue to support the patient holistically.   Goals of Care and Additional Recommendations:  Limitations on Scope of Treatment: Full Scope Treatment Brief life review: Ms Penniman is from [place], [place], her 3 year old grand daughter lives with her, Ms Cantrall is happy today, as her 69 year old grand daughter is graduating 7th grade today, with good grades and perfect attendance. Ms Lapage states that her faith is a great source of support and strength for her, " you know, God's got His Hand over me." provided her with active listening and supportive care.   Code Status:    Code Status Orders        Start     Ordered   07/14/16 2325  Full code  Continuous     07/14/16 2324    Code Status History    Date Active Date Inactive Code Status Order ID Comments User Context   This patient has a current code status  but no historical code status.       Prognosis:   Unable to determine  Discharge Planning:  To Be Determined  Care plan was discussed with patient, RN    Thank you for allowing the Palliative Medicine Team to assist in the care of this patient.   Time In:  1005 Time Out: 1035 Total Time 30 Prolonged Time Billed  no       Greater than 50%  of this time was spent counseling and coordinating care related to the above assessment and plan.  Micheline Rough, MD [phone]  Please contact Palliative Medicine Team phone at [phone] for questions and concerns.

## 2016-07-17 NOTE — Progress Notes (Signed)
I called and spoke with the patient. Her Brain MRI revealed up to 6 metastatic lesions. We discussed the use of steroids, and that we will need to wait for her pathology to determine how we move forward with radiation. She may be a candidate for stereotactic radiotherapy (SRS) opposed to whole brain XRT. Once we know pathology, we will determine if we proceed with 3T MRI if we're still thinking SRS is an option. We would coordinate her 3T MRI as an outpatient as well. We will keep her informed of pathology when it comes available.      Carola Rhine, PAC

## 2016-07-17 NOTE — Progress Notes (Signed)
No reactions noted from Ferumoxytol test dose.

## 2016-07-17 NOTE — Progress Notes (Signed)
PROGRESS NOTE  Heather Hinton KVQ:[id number] DOB: 10-01-1960 DOA: 07/14/2016 PCP: Neale Burly, MD   LOS: 3 days   Brief Narrative / Interim history: 56 y.o. female with medical history significant of HTN and recent (3 months) h/o spinal stenosis presenting with weakness, left hip pain as well as subjective chills and fevers with cough.  Symptoms have been progressing  She was admitted to the hospital, and oncology (Dr Alvy Bimler) was consulted.  Initial imaging showed probable lung primary with metastatic disease in the ischial tuberosity on the left  with significant tumor burden, as well as metastatic disease in her brain.  Radiation oncology was consulted, and patient is to get radiation to her lung mass, hip mass and probably brain.  At oncology recommendations, palliative care was consulted as well to assist with pain management.  She is status post biopsy by interventional radiology on 6/4  Assessment & Plan: Principal Problem:   Metastatic lung cancer (metastasis from lung to other site) [facility]) Active Problems:   Essential hypertension   Hypokalemia   Hyperglycemia   Symptomatic anemia   Thrombocytosis ([facility])   Cancer associated pain   Metastasis to bone [facility])   Left hip pain   Palliative care by specialist   Probable metastatic lung cancer -Patient with new diagnosis of lung mass which appears to have metastasized to the left hip as well as to the brain.  Appreciate oncology consultation.  Status post biopsy by IR, await pathology -MRI of the brain on 6/4 unfortunately shows presence of brain metastasis as well.  [facility]con to weigh in as to timing of treatment for this once as well. -Palliative care consulted early on, to assist with pain management currently -Continue Decadron for brain vasogenic edema  Postobstructive pneumonia -She is near complete right middle lobe collapse, she has been febrile in the hospital and has a cough -She was started on IV antibiotics with Zosyn  on admission, her last fever was on 6/3, she defervesced and will transition to Augmentin today.  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  PT to evaluate today   DVT prophylaxis: Lovenox Code Status: Full code Family Communication: No family at bedside Disposition Plan: home when ready   Consultants:   Oncology  Radiation oncology  Palliative care  Procedures:   Hip mass biopsy 6/4  Antimicrobials:  Zosyn 6/3 >> 6/5  Augmentin 6/5 >>  Subjective: -She is feeling well, appears upbeat, states her pain is well controlled.  Denies any chest pain or shortness of breath.  No lightheadedness or dizziness.  No abdominal pain, nausea or vomiting and is able to eat well  Objective: Vitals:   07/16/16 1615 07/16/16 1645 07/16/16 2100 07/17/16 0535  BP: 140/79 129/77 (!) 114/59 135/83  Pulse: 90 86 (!) 108 100  Resp: 20 18 20 20   Temp: 97.8 F (36.6 C) 97.6 F (36.4 C) 97.7 F (36.5 C) 97.6 F (36.4 C)  TempSrc: Oral Oral Oral Oral  SpO2: 100% 99% 99% 100%  Weight:      Height:        Intake/Output Summary (Last 24 hours) at 07/17/16 1129 Last data filed at 07/17/16 0951  Gross per 24 hour  Intake              350 ml  Output             1300 ml  Net             -950 ml   Filed Weights   07/14/16 1553  Weight: 69.4 kg (153 lb)    Examination:  Vitals:   07/16/16 1615 07/16/16 1645  07/16/16 2100 07/17/16 0535  BP: 140/79 129/77 (!) 114/59 135/83  Pulse: 90 86 (!) 108 100  Resp: 20 18 20 20   Temp: 97.8 F (36.6 C) 97.6 F (36.4 C) 97.7 F (36.5 C) 97.6 F (36.4 C)  TempSrc: Oral Oral Oral Oral  SpO2: 100% 99% 99% 100%  Weight:      Height:       Constitutional: No apparent distress Eyes: Conjunctivae normal, pupils equal round reactive Respiratory: Clear to auscultation bilaterally, moves air well, no wheezing, no crackles Cardiovascular: Regular rate and rhythm, no murmurs heard.  2+ peripheral pulses. Abdomen: No tenderness to palpation, bowel sounds positive Skin: No rashes Neurologic: Nonfocal, strength equal   Data Reviewed: I have independently reviewed following labs and imaging studies   CBC:  Recent Labs Lab 07/14/16 1612 07/15/16 1157 07/16/16 0440 07/17/16 0352  WBC 22.4* 29.1* 31.1* 32.8*  NEUTROABS 17.7*  --  28.0*  --   HGB 7.9* 10.6* 10.4* 10.2*  HCT 25.9* 31.9* 32.0* 32.1*  MCV 79.0 76.7* 78.4 78.9  PLT 743* 660* 632* 700*   Basic Metabolic Panel:  Recent Labs Lab 07/14/16 1612 07/15/16 1157 07/16/16 0440 07/17/16 0352  NA 142 139 140 139  K 2.5* 2.8* 3.9 4.1  CL 104 104 106 106  CO2 25 25 24 23   GLUCOSE 115* 120* 125* 133*  BUN 12 7 12 19   CREATININE 0.82 0.67 0.70 0.89  CALCIUM 9.1 8.5* 8.7* 8.5*  MG 2.1  --   --   --    GFR: Estimated Creatinine Clearance: 68.6 mL/min (by C-G formula based on SCr of 0.89 mg/dL). Liver Function Tests:  Recent Labs Lab 07/14/16 1612 07/16/16 0440  AST 14* 10*  ALT 11* 11*  ALKPHOS 174* 138*  BILITOT 0.6 0.6  PROT 8.1 7.1  ALBUMIN 2.5* 2.3*   No results for input(s): LIPASE, AMYLASE in the last 168 hours. No results for input(s): AMMONIA in the last 168 hours. Coagulation Profile:  Recent Labs Lab 07/16/16 0440  INR 1.19   Cardiac Enzymes: No results for input(s): CKTOTAL, CKMB, CKMBINDEX, TROPONINI in the last 168 hours. BNP (last 3 results) No results for  input(s): PROBNP in the last 8760 hours. HbA1C: No results for input(s): HGBA1C in the last 72 hours. CBG: No results for input(s): GLUCAP in the last 168 hours. Lipid Profile: No results for input(s): CHOL, HDL, LDLCALC, TRIG, CHOLHDL, LDLDIRECT in the last 72 hours. Thyroid Function Tests: No results for input(s): TSH, T4TOTAL, FREET4, T3FREE, THYROIDAB in the last 72 hours. Anemia Panel:  Recent Labs  07/16/16 0440  FERRITIN 827*  TIBC 188*  IRON 19*   Urine analysis:    Component Value Date/Time   COLORURINE STRAW (A) 07/14/2016 1856   APPEARANCEUR CLEAR 07/14/2016 1856   LABSPEC 1.020 07/14/2016 1856   PHURINE 7.0 07/14/2016 1856  GLUCOSEU NEGATIVE 07/14/2016 1856   HGBUR NEGATIVE 07/14/2016 1856   BILIRUBINUR NEGATIVE 07/14/2016 1856   KETONESUR NEGATIVE 07/14/2016 1856   PROTEINUR NEGATIVE 07/14/2016 1856   NITRITE NEGATIVE 07/14/2016 1856   LEUKOCYTESUR NEGATIVE 07/14/2016 1856   Sepsis Labs: Invalid input(s): PROCALCITONIN, LACTICIDVEN  No results found for this or any previous visit (from the past 240 hour(s)).    Radiology Studies: [name] PO Contrast  Result Date: 07/16/2016 CLINICAL DATA:  Brain metastasis.  Metastatic lung cancer. EXAM: MRI HEAD WITHOUT AND WITH CONTRAST TECHNIQUE: Multiplanar, multiecho pulse sequences of the brain and surrounding structures were obtained without and with intravenous contrast. CONTRAST:  72mL MULTIHANCE GADOBENATE DIMEGLUMINE 529 MG/ML IV SOLN COMPARISON:  CT head without contrast 08/22/2011 FINDINGS: Brain: Multiple enhancing brain lesions are compatible with metastatic disease. The largest lesion is in the left occipital lobe measuring at 2.0 x 1.7 x 1.9 cm. There is surrounding vasogenic edema with effacement of the adjacent sulci. A 7 mm lesion is present anteriorly in the left frontal lobe on image 34 of series 11. Three additional 3 mm lesions are present. Lesions are present in the anterior inferior frontal lobes  bilaterally on image 25 of series 11 and in the medial right parietal lobe on image 35 of series 11. A 4 mm lesion is present along the inferior left temporal lobe. Vascular: Flow is present in the major intracranial arteries. Skull and upper cervical spine: The skullbase is within normal limits. The upper cervical spine is unremarkable. Sinuses/Orbits: The paranasal sinuses can the mastoid air cells are clear. Bilateral globes and orbits are within normal limits. IMPRESSION: 1. Six enhancing lesions scattered throughout the brain compatible with metastatic disease. The largest lesion involves the left occipital lobe. 2. Surrounding vasogenic edema involves the 2 largest lesions with mass effect in the left occipital left lobe evidence by effacement of the sulci. Electronically Signed   By: San Morelle M.D.   On: 07/16/2016 18:55   Ct Biopsy  Result Date: 07/16/2016 CLINICAL DATA:  Right middle lobe mass lesion with regional adenopathy. Destructive left ischial mass with soft tissue component. EXAM: CT GUIDED CORE BIOPSY OF LEFT ISCHIAL MASS ANESTHESIA/SEDATION: Intravenous Fentanyl and Versed were administered as conscious sedation during continuous monitoring of the patient's level of consciousness and physiological / cardiorespiratory status by the radiology RN, with a total moderate sedation time of 10 minutes. PROCEDURE: The procedure risks, benefits, and alternatives were explained to the patient. Questions regarding the procedure were encouraged and answered. The patient understands and consents to the procedure. patient placed prone. Select axial scans obtained through the pelvis. The left ischial mass was identified and an appropriate skin entry site determined and marked. The operative field was prepped with chlorhexidinein a sterile fashion, and a sterile drape was applied covering the operative field. A sterile gown and sterile gloves were used for the procedure. Local anesthesia was provided  with 1% Lidocaine. Under CT fluoroscopic guidance, a 17 gauge trocar needle was advanced to the margin of the lesion. Once needle tip position was confirmed, coaxial 18-gauge core biopsy samples were obtained, submitted in formalin to surgical pathology. The guide needle was removed. Postprocedure scans show no hemorrhage or other apparent complication. The patient tolerated the procedure well. COMPLICATIONS: None immediate FINDINGS: Lytic left ischial mass with extraosseous soft tissue component was again localized. Representative core biopsy samples obtained as above. IMPRESSION: 1. Technically successful core biopsy, left ischial mass. Electronically Signed   By: Eden Emms.D.  On: 07/16/2016 16:42     Scheduled Meds: . amLODipine  10 mg Oral QHS  . dexamethasone  4 mg Intravenous TID  . docusate sodium  100 mg Oral BID  . enoxaparin (LOVENOX) injection  40 mg Subcutaneous Q24H  . gabapentin  300 mg Oral BID  . morphine  15 mg Oral Q12H  . polyethylene glycol  17 g Oral Daily  . pravastatin  20 mg Oral q1800   Continuous Infusions: . sodium chloride    . 0.9 % NaCl with KCl 40 mEq / L 100 mL/hr (07/16/16 0144)  . methocarbamol (ROBAXIN)  IV 500 mg (07/15/16 0019)  . piperacillin-tazobactam (ZOSYN)  IV 3.375 g (07/17/16 0936)     Marzetta Board, MD, PhD [facility] Pager [phone]  If 7PM-7AM, please contact night-coverage [email or web address] Password [email or web address] 07/17/2016, 11:29 AM

## 2016-07-17 NOTE — Evaluation (Signed)
Physical Therapy Evaluation Patient Details Name: Heather Hinton MRN: [id number] DOB: Oct 07, 1960 Today's Date: 07/17/2016   History of Present Illness  56 yo female admitted with severe Left hip pain.  Found to have , brain. Lung masses, significant bone lesion left hip, metastatic lung cancer, and brain lesions  Clinical Impression  The patient ambulated with min guard and Quad cane. Will instruct in Rw for safety ans balance slightly impaired with cane. Pt admitted with above diagnosis. Pt currently with functional limitations due to the deficits listed below (see PT Problem List).Pt will benefit from skilled PT to increase their independence and safety with mobility to allow discharge to the venue listed below.       Follow Up Recommendations Home health PT;Supervision/Assistance - 24 hour    Equipment Recommendations  Rolling walker with 5" wheels    Recommendations for Other Services       Precautions / Restrictions Precautions Precautions: Fall      Mobility  Bed Mobility Overal bed mobility: Independent                Transfers Overall transfer level: Needs assistance Equipment used: Quad cane Transfers: Sit to/from Stand Sit to Stand: Supervision            Ambulation/Gait Ambulation/Gait assistance: Min guard Ambulation Distance (Feet): 400 Feet Assistive device: Quad cane Gait Pattern/deviations: Step-to pattern     General Gait Details: uses cane on the left per patient request. 2 episodes of the left leg scissoring.  Stairs            Wheelchair Mobility    Modified Rankin (Stroke Patients Only)       Balance Overall balance assessment: Needs assistance         Standing balance support: During functional activity;Single extremity supported Standing balance-Leahy Scale: Fair Standing balance comment: with cane                             Pertinent Vitals/Pain Pain Assessment: No/denies pain    Home Living  Family/patient expects to be discharged to:: Private residence Living Arrangements: Spouse/significant other Available Help at Discharge: Family Type of Home: House Home Access: Stairs to enter Entrance Stairs-Rails: Psychiatric nurse of Steps: 3 Home Layout: One level Home Equipment: Cane - quad      Prior Function                 Hand Dominance        Extremity/Trunk Assessment   Upper Extremity Assessment Upper Extremity Assessment: Overall WFL for tasks assessed    Lower Extremity Assessment Lower Extremity Assessment: LLE deficits/detail LLE Deficits / Details: grossly 3+ hip flexion and knee extension       Communication      Cognition Arousal/Alertness: Awake/alert Behavior During Therapy: WFL for tasks assessed/performed Overall Cognitive Status: Within Functional Limits for tasks assessed                                        General Comments      Exercises     Assessment/Plan    PT Assessment Patient needs continued PT services  PT Problem List Decreased strength;Decreased activity tolerance;Decreased balance;Decreased mobility;Decreased coordination;Decreased knowledge of precautions;Decreased safety awareness;Decreased knowledge of use of DME       PT Treatment Interventions DME instruction;Gait training;Stair training;Functional mobility training;Therapeutic activities;Therapeutic  exercise;Patient/family education    PT Goals (Current goals can be found in the Care Plan section)  Acute Rehab PT Goals Patient Stated Goal: to go home PT Goal Formulation: With patient Time For Goal Achievement: 07/31/16 Potential to Achieve Goals: Good    Frequency Min 3X/week   Barriers to discharge        Co-evaluation               AM-PAC PT "6 Clicks" Daily Activity  Outcome Measure Difficulty turning over in bed (including adjusting bedclothes, sheets and blankets)?: None Difficulty moving from lying on  back to sitting on the side of the bed? : None Difficulty sitting down on and standing up from a chair with arms (e.g., wheelchair, bedside commode, etc,.)?: None Help needed moving to and from a bed to chair (including a wheelchair)?: A Little Help needed walking in hospital room?: A Little Help needed climbing 3-5 steps with a railing? : A Little 6 Click Score: 21    End of Session Equipment Utilized During Treatment: Gait belt Activity Tolerance: Patient tolerated treatment well Patient left: in bed;with call bell/phone within reach Nurse Communication: Mobility status PT Visit Diagnosis: Unsteadiness on feet (R26.81)    Time: 0131-4388 PT Time Calculation (min) (ACUTE ONLY): 23 min   Charges:   PT Evaluation $PT Eval Low Complexity: 1 Procedure PT Treatments $Gait Training: 8-22 mins   PT G CodesTresa Endo PT [phone]   Claretha Cooper 07/17/2016, 5:55 PM

## 2016-07-17 NOTE — Progress Notes (Signed)
Heather Hinton   DOB:[birth year]   NF#:[id number]    Subjective: She is doing well.  Her pain is under control.  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  She denies headache, nausea or new neurological deficit.  She is constipated for several days.  Objective:  Vitals:   07/17/16 0535 07/17/16 1457  BP: 135/83 122/80  Pulse: 100 (!) 103  Resp: 20 20  Temp: 97.6 F (36.4 C) 98.5 F (36.9 C)     Intake/Output Summary (Last 24 hours) at 07/17/16 1510 Last data filed at 07/17/16 1459  Gross per 24 hour  Intake             1450 ml  Output             1300 ml  Net              150 ml    GENERAL:alert, no distress and comfortable SKIN: skin color, texture, turgor are normal, no rashes or significant lesions EYES: normal, Conjunctiva are pink and non-injected, sclera clear Musculoskeletal:no cyanosis of digits and no clubbing  NEURO: alert & oriented x 3 with fluent speech, no focal motor/sensory deficits   Labs:  Lab Results  Component Value Date   WBC 32.8 (H) 07/17/2016   HGB 10.2 (L) 07/17/2016   HCT 32.1 (L) 07/17/2016   MCV 78.9 07/17/2016   PLT 624 (H) 07/17/2016   NEUTROABS 28.0 (H) 07/16/2016    Lab Results  Component Value Date   NA 139 07/17/2016   K 4.1 07/17/2016   CL 106 07/17/2016   CO2 23 07/17/2016    Studies:  [name] QI Contrast  Result Date: 07/16/2016 CLINICAL DATA:  Brain metastasis.  Metastatic lung cancer. EXAM: MRI HEAD WITHOUT AND WITH CONTRAST TECHNIQUE: Multiplanar, multiecho pulse sequences of the brain and surrounding structures were obtained without and with intravenous contrast. CONTRAST:  18mL MULTIHANCE GADOBENATE DIMEGLUMINE 529 MG/ML IV SOLN COMPARISON:  CT head without contrast 08/22/2011 FINDINGS: Brain: Multiple enhancing brain lesions are compatible with metastatic disease. The largest lesion is in the left occipital lobe measuring at 2.0 x 1.7 x 1.9 cm. There is surrounding vasogenic edema with  effacement of the adjacent sulci. A 7 mm lesion is present anteriorly in the left frontal lobe on image 34 of series 11. Three additional 3 mm lesions are present. Lesions are present in the anterior inferior frontal lobes bilaterally on image 25 of series 11 and in the medial right parietal lobe on image 35 of series 11. A 4 mm lesion is present along the inferior left temporal lobe. Vascular: Flow is present in the major intracranial arteries. Skull and upper cervical spine: The skullbase is within normal limits. The upper cervical spine is unremarkable. Sinuses/Orbits: The paranasal sinuses can the mastoid air cells are clear. Bilateral globes and orbits are within normal limits. IMPRESSION: 1. Six enhancing lesions scattered throughout the brain compatible with metastatic disease. The largest lesion involves the left occipital lobe. 2. Surrounding vasogenic edema involves the 2 largest lesions with mass effect in the left occipital left lobe evidence by effacement of the sulci. Electronically Signed   By: San Morelle M.D.   On: 07/16/2016 18:55   Ct Biopsy  Result Date: 07/16/2016 CLINICAL DATA:  Right middle lobe mass lesion with regional adenopathy. Destructive left ischial mass with soft tissue component. EXAM: CT GUIDED CORE BIOPSY OF LEFT ISCHIAL MASS ANESTHESIA/SEDATION: Intravenous Fentanyl and Versed were administered as conscious sedation during continuous  monitoring of the patient's level of consciousness and physiological / cardiorespiratory status by the radiology RN, with a total moderate sedation time of 10 minutes. PROCEDURE: The procedure risks, benefits, and alternatives were explained to the patient. Questions regarding the procedure were encouraged and answered. The patient understands and consents to the procedure. patient placed prone. Select axial scans obtained through the pelvis. The left ischial mass was identified and an appropriate skin entry site determined and marked. The  operative field was prepped with chlorhexidinein a sterile fashion, and a sterile drape was applied covering the operative field. A sterile gown and sterile gloves were used for the procedure. Local anesthesia was provided with 1% Lidocaine. Under CT fluoroscopic guidance, a 17 gauge trocar needle was advanced to the margin of the lesion. Once needle tip position was confirmed, coaxial 18-gauge core biopsy samples were obtained, submitted in formalin to surgical pathology. The guide needle was removed. Postprocedure scans show no hemorrhage or other apparent complication. The patient tolerated the procedure well. COMPLICATIONS: None immediate FINDINGS: Lytic left ischial mass with extraosseous soft tissue component was again localized. Representative core biopsy samples obtained as above. IMPRESSION: 1. Technically successful core biopsy, left ischial mass. Electronically Signed   By: Lucrezia Europe M.D.   On: 07/16/2016 16:42    Assessment & Plan:  Lung masses, significant bone lesion, metastatic lung cancer until proven otherwise Unfortunately, MRI show cerebral metastasis.  Radiation oncologist will add radiation therapy to the brain in addition to other areas for palliative radiation Biopsy is pending I recommend palliative care consult to assist in pain management I will consult physical therapy to assess in gait assessment and safety I will return once biopsy results are available  Severe cancer pain She was started on MS Contin on 07/15/2016 with better pain control, along with low-dose dexamethasone Appreciate assistance from palliative care team for pain management  Severe anorexia, malignant cachexia, severe protein calorie malnutrition Hopefully, low-dose dexamethasone will help with appetite She went a dietitian consult for nutritional supplement  Severe microcytic anemia She has received blood transfusion Iron studies confirm evidence of iron deficiency and anemia chronic illness.  The  elevated ferritin and platelet count is likely reactive in nature. The most likely cause of her anemia is due to chronic blood loss/malabsorption syndrome. We discussed some of the risks, benefits, and alternatives of intravenous iron infusions. The patient is symptomatic from anemia and the iron level is critically low. She tolerated oral iron supplement poorly and desires to achieved higher levels of iron faster for adequate hematopoesis. Some of the side-effects to be expected including risks of infusion reactions, phlebitis, headaches, nausea and fatigue.  The patient is willing to proceed. I will order 1 dose of IV iron today  Severe constipation We will start her on laxative therapy  Discharge planning She is too ill to be discharged. She will likely be here until the end of the week    Heath Lark, MD 07/17/2016  3:10 PM

## 2016-07-18 ENCOUNTER — Inpatient Hospital Stay (HOSPITAL_COMMUNITY): Payer: Medicaid Other

## 2016-07-18 ENCOUNTER — Ambulatory Visit
Admit: 2016-07-18 | Discharge: 2016-07-18 | Disposition: A | Discharge status: Home / Self Care | Payer: Medicaid Other | Attending: Radiation Oncology | Admitting: Radiation Oncology

## 2016-07-18 ENCOUNTER — Other Ambulatory Visit: Payer: Self-pay | Admitting: Hematology and Oncology

## 2016-07-18 DIAGNOSIS — Z510 Encounter for antineoplastic radiation therapy: Secondary | ICD-10-CM | POA: Diagnosis not present

## 2016-07-18 DIAGNOSIS — C7931 Secondary malignant neoplasm of brain: Secondary | ICD-10-CM

## 2016-07-18 LAB — BASIC METABOLIC PANEL
ANION GAP: 6 (ref 5–15)
BUN: 15 mg/dL (ref 6–20)
CALCIUM: 8.7 mg/dL — AB (ref 8.9–10.3)
CHLORIDE: 110 mmol/L (ref 101–111)
CO2: 23 mmol/L (ref 22–32)
Creatinine, Ser: 0.65 mg/dL (ref 0.44–1.00)
GFR calc Af Amer: >60 mL/min (ref >60)
GFR calc non Af Amer: >60 mL/min (ref >60)
GLUCOSE: 130 mg/dL — AB (ref 65–99)
POTASSIUM: 4.6 mmol/L (ref 3.5–5.1)
Sodium: 139 mmol/L (ref 135–145)

## 2016-07-18 LAB — CBC
HEMATOCRIT: 33.1 % — AB (ref 36.0–46.0)
HEMOGLOBIN: 10.3 g/dL — AB (ref 12.0–15.0)
MCH: 24.9 pg — AB (ref 26.0–34.0)
MCHC: 31.1 g/dL (ref 30.0–36.0)
MCV: 80.1 fL (ref 78.0–100.0)
Platelets: 635 10*3/uL — ABNORMAL HIGH (ref 150–400)
RBC: 4.13 MIL/uL (ref 3.87–5.11)
RDW: 16.5 % — ABNORMAL HIGH (ref 11.5–15.5)
WBC: 38.3 10*3/uL — ABNORMAL HIGH (ref 4.0–10.5)

## 2016-07-18 MED ORDER — MORPHINE SULFATE (PF) 2 MG/ML IV SOLN
2.0000 mg | INTRAVENOUS | Status: DC | PRN
  Administered 2016-07-18 – 2016-07-25 (×7): 2 mg via INTRAVENOUS
  Filled 2016-07-18 (×8): qty 1

## 2016-07-18 MED ORDER — GADOBENATE DIMEGLUMINE 529 MG/ML IV SOLN
15.0000 mL | Freq: Once | INTRAVENOUS | Status: AC | PRN
  Administered 2016-07-18: 15 mL via INTRAVENOUS

## 2016-07-18 MED ORDER — LORAZEPAM 2 MG/ML IJ SOLN
1.0000 mg | Freq: Once | INTRAMUSCULAR | Status: AC
  Administered 2016-07-18: 1 mg via INTRAVENOUS
  Filled 2016-07-18: qty 1

## 2016-07-18 MED ORDER — HYDROCODONE-ACETAMINOPHEN 5-325 MG PO TABS
1.0000 | ORAL_TABLET | ORAL | Status: DC | PRN
  Administered 2016-07-19 – 2016-07-25 (×17): 2 via ORAL
  Filled 2016-07-18 (×17): qty 2

## 2016-07-18 NOTE — Progress Notes (Signed)
Daily Progress Note   Patient Name: Heather Hinton       Date: 07/18/2016 DOB: Oct 30, 1960  Age: 56 y.o. MRN#: [id number] Attending Physician: Theodis Blaze, MD Primary Care Physician: Neale Burly, MD Admit Date: 07/14/2016  Reason for Consultation/Follow-up: Pain control  Life limiting illness: Diffuse metastatic cancer, suspect primary lung cancer with metastatic cancer to the bone.  Subjective: Awake but sleepy after return from MRI and states she wants to finish taking a nap.  Pain is well controlled but reports that she had some worsening of pain this AM.  She remains in good spirits and denies other needs today. See below  Length of Stay: 4  Current Medications: Scheduled Meds:  . amLODipine  10 mg Oral QHS  . amoxicillin-clavulanate  1 tablet Oral Q12H  . dexamethasone  4 mg Intravenous TID  . enoxaparin (LOVENOX) injection  40 mg Subcutaneous Q24H  . gabapentin  300 mg Oral BID  . morphine  15 mg Oral Q12H  . polyethylene glycol  17 g Oral Daily  . pravastatin  20 mg Oral q1800  . senna-docusate  2 tablet Oral BID    Continuous Infusions: . sodium chloride    . 0.9 % NaCl with KCl 40 mEq / L 75 mL/hr (07/18/16 1120)  . methocarbamol (ROBAXIN)  IV 500 mg (07/15/16 0019)    PRN Meds: acetaminophen **OR** acetaminophen, HYDROcodone-acetaminophen, methocarbamol (ROBAXIN)  IV, morphine injection, ondansetron **OR** ondansetron (ZOFRAN) IV  Physical Exam         Sleepy but arouses easily. resting in bed Lying with L hip up S1 S2 Clear Abdomen soft No edema Non focal  Vital Signs: BP 133/77 (BP Location: Right Arm)   Pulse 96   Temp 97.4 F (36.3 C) (Oral)   Resp 20   Ht 5\' 7"  (1.702 m)   Wt 69.4 kg (153 lb)   SpO2 99%   BMI 23.96 kg/m  SpO2: SpO2: 99 % O2  Device: O2 Device: Not Delivered O2 Flow Rate: O2 Flow Rate (L/min): 2 L/min  Intake/output summary:   Intake/Output Summary (Last 24 hours) at 07/18/16 1544 Last data filed at 07/18/16 1401  Gross per 24 hour  Intake              837 ml  Output  3260 ml  Net            -2423 ml   LBM: Last BM Date: 07/18/16 Baseline Weight: Weight: 69.4 kg (153 lb) Most recent weight: Weight: 69.4 kg (153 lb)       Palliative Assessment/Data:    Flowsheet Rows     Most Recent Value  Intake Tab  Referral Department  Hospitalist  Unit at Time of Referral  Oncology Unit  Palliative Care Primary Diagnosis  Cancer  Date Notified  07/15/16  Palliative Care Type  New Palliative care  Reason for referral  Pain  Date of Admission  07/14/16  Date first seen by Palliative Care  07/15/16  # of days Palliative referral response time  0 Day(s)  # of days IP prior to Palliative referral  1  Clinical Assessment  Palliative Performance Scale Score  50%  Pain Max last 24 hours  10  Pain Min Last 24 hours  6  Dyspnea Max Last 24 Hours  0  Dyspnea Min Last 24 hours  0  Nausea Max Last 24 Hours  2  Nausea Min Last 24 Hours  0  Anxiety Max Last 24 Hours  0  Anxiety Min Last 24 Hours  0  Other Max Last 24 Hours  0  Psychosocial & Spiritual Assessment  Palliative Care Outcomes  Patient/Family meeting held?  Yes  Who was at the meeting?  pt and multiple family members  Palliative Care Outcomes  Improved pain interventions  Palliative Care follow-up planned  Yes, Facility      Patient Active Problem List   Diagnosis Date Noted  . Iron deficiency anemia   . Metastasis to brain ([place])   . 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 Metastasis to bone ([place])   . Left hip pain   . Palliative care by specialist   . Metastatic lung cancer (metastasis from lung to other site) ([place]) 07/14/2016  . Essential hypertension 07/14/2016  . Hypokalemia 07/14/2016  . Hyperglycemia 07/14/2016  . Symptomatic  anemia 07/14/2016  . Thrombocytosis ([place]) 07/14/2016    Palliative Care Assessment & Plan   Patient Profile:    Assessment:  Diffuse metastatic cancer, suspect primary lung cancer with metastatic cancer to the bone Uncontrolled pain  Recommendations/Plan:  Pain well controlled this afternoon.  She reports some pain this AM, but thinks she waited too long to ask for rescue med which led it to get out of control.  Continue current pain regimen, monitor for increasing pain meds needs. Discussed use of pain medication prior to activities that cause exacerbation of pain, such as lying on table for radiation.  We also discussed use of oral medication for rescue meds in order to make sure they are effective prior to discharge.  I did leave IV medication to use as second line medication in case oral route is ineffective.  Continue anti emetic regimen  Cnstipation: opioid related.  Continue senna. Continue miralax.   Continue to support the patient holistically.   Goals of Care and Additional Recommendations:  Limitations on Scope of Treatment: Full Scope Treatment Brief life review: Ms Jock is from [place], [place], her 74 year old grand daughter lives with her, Ms Lager is happy today, as her 38 year old grand daughter is graduating 7th grade today, with good grades and perfect attendance. Ms Hayworth states that her faith is a great source of support and strength for her, " you know, God's got His Hand over me." provided her with active  listening and supportive care.   Code Status:    Code Status Orders        Start     Ordered   07/14/16 2325  Full code  Continuous     07/14/16 2324    Code Status History    Date Active Date Inactive Code Status Order ID Comments User Context   This patient has a current code status but no historical code status.       Prognosis:   Unable to determine  Discharge Planning:  To Be Determined  Care plan was discussed with patient, RN     Thank you for allowing the Palliative Medicine Team to assist in the care of this patient.   Time In: 1515 Time Out: 1540 Total Time 25 Prolonged Time Billed  no       Greater than 50%  of this time was spent counseling and coordinating care related to the above assessment and plan.  Micheline Rough, MD [phone]  Please contact Palliative Medicine Team phone at [phone] for questions and concerns.

## 2016-07-18 NOTE — Progress Notes (Signed)
Patient ID: Heather Hinton, female   DOB: February 12, 1961, 56 y.o.   MRN: [id number]    PROGRESS NOTE    Minnah Llamas  UUV:[id number] DOB: 1960/09/29 DOA: 07/14/2016  PCP: Neale Burly, MD    Brief Narrative:  Patient is 56 year old female with known hypertension and recent history of spinal stenosis, presented with weakness, left hip pain, fevers and chills. Initial imaging studies notable for probable primary lung malignancy with metastatic disease in the ischial tuberosity on the left with significant tumor burden as well as metastatic disease and brain. Radiation oncology consulted, recommendation was to wait for pathology results to be back so that we can determine how to move forward with radiation. Patient may be candidate for stereotactic radiation as opposed to whole brain radiation. Patient is now status post biopsy of the left ischial mass, done on 07/16/2016 by interventional radiologist. Palliative care team also consulted for assistance on pain management.  Assessment and plan:  Lung mass, significant bone lesion, worrisome for metastatic lung cancer until proven otherwise - Additional imaging studies including MRI, also notable for cerebral metastases - Radiation oncology already consulted, further recommendations pending biopsy results - Oncologist also following, assistance appreciated - Biopsy still pending - Palliative care team also following, assistance appreciated - PT evaluation requested - Continue dexamethasone 4 mg 3 times a day IV for now  Postobstructive pneumonia, right middle lobe - Initially started on IV antibiotics Zosyn on admissionand was successfully transitioned to Augmentin on 07/17/2016 - Currently afebrile - WBC still elevated but suspected to be due to malignancy and steroids rather than an infectious process - Oxygen saturations remained stable  Severe cancer pain - Started on MS Contin 15 mg twice a day by mouth - This is helping but patient  still requiring IV pain medication for breakthrough pain  Severe protein calorie malnutrition, malignant cachexia - Consult nutritionist for assistance  Severe microcytic anemia - Iron studies confirm evidence of iron deficiency as well as anemia of chronic disease - This is most likely secondary to malignancy as well as chronic blood loss, malabsorption syndrome - Patient has received one dose of IV iron 07/17/2016 per oncologist recommendation - Hemoglobin is overall stable at 10.3 with no evidence of active bleeding  Essential hypertension - Reasonable inpatient control on Norvasc  Hyperlipidemia - Continue statin  History of smoking tobacco - Over 40 years, already counseled on cessation   DVT prophylaxis: Lovenox Code Status: Full Family Communication: Patient at bedside  Disposition Plan: Patient is too ill to be discharged, oncology suspects patient will require at least 3-4 days of hospital care  Consultants:   Oncology  Radiation oncology  Palliative care  Procedures:   Left ischial bone biopsy on 07/16/2016  Antimicrobials:   Zosyn 07/15/2016 --> 07/17/2016  Augmentin 07/17/2016 -->   Subjective: Patient reports feeling little bit better this morning, still with intermittent pain, worse at the left hip area. Only occasionally relieved with oral medication but still requiring IV pain medicine for better control.  Objective: Vitals:   07/17/16 1457 07/17/16 1614 07/17/16 2122 07/18/16 0544  BP: 122/80 127/84 (!) 147/88 135/78  Pulse: (!) 103 97 (!) 101 91  Resp: 20 20 19 19   Temp: 98.5 F (36.9 C) 98.3 F (36.8 C) 97.8 F (36.6 C) 98 F (36.7 C)  TempSrc: Oral Oral Oral Oral  SpO2: 97% 100% 97% 100%  Weight:      Height:        Intake/Output Summary (Last 24 hours) at  07/18/16 1057 Last data filed at 07/18/16 0956  Gross per 24 hour  Intake             1937 ml  Output             2260 ml  Net             -323 ml   Filed Weights    07/14/16 1553  Weight: 69.4 kg (153 lb)   Examination:  General exam: Appears calm and comfortable  Respiratory system: Clear to auscultation. Respiratory effort normal. Cardiovascular system: S1 & S2 heard, RRR. No JVD, murmurs, rubs, gallops or clicks. No pedal edema. Gastrointestinal system: Abdomen is nondistended, soft and nontender. No organomegaly or masses felt.  Central nervous system: Alert and oriented. No focal neurological deficits.  Data Reviewed: I have personally reviewed following labs and imaging studies  CBC:  Recent Labs Lab 07/14/16 1612 07/15/16 1157 07/16/16 0440 07/17/16 0352 07/18/16 0415  WBC 22.4* 29.1* 31.1* 32.8* 38.3*  NEUTROABS 17.7*  --  28.0*  --   --   HGB 7.9* 10.6* 10.4* 10.2* 10.3*  HCT 25.9* 31.9* 32.0* 32.1* 33.1*  MCV 79.0 76.7* 78.4 78.9 80.1  PLT 743* 660* 632* 624* 182*   Basic Metabolic Panel:  Recent Labs Lab 07/14/16 1612 07/15/16 1157 07/16/16 0440 07/17/16 0352 07/18/16 0415  NA 142 139 140 139 139  K 2.5* 2.8* 3.9 4.1 4.6  CL 104 104 106 106 110  CO2 25 25 24 23 23   GLUCOSE 115* 120* 125* 133* 130*  BUN 12 7 12 19 15   CREATININE 0.82 0.67 0.70 0.89 0.65  CALCIUM 9.1 8.5* 8.7* 8.5* 8.7*  MG 2.1  --   --   --   --    Liver Function Tests:  Recent Labs Lab 07/14/16 1612 07/16/16 0440  AST 14* 10*  ALT 11* 11*  ALKPHOS 174* 138*  BILITOT 0.6 0.6  PROT 8.1 7.1  ALBUMIN 2.5* 2.3*   Coagulation Profile:  Recent Labs Lab 07/16/16 0440  INR 1.19   Anemia Panel:  Recent Labs  07/16/16 0440  FERRITIN 827*  TIBC 188*  IRON 19*   Urine analysis:    Component Value Date/Time   COLORURINE STRAW (A) 07/14/2016 1856   APPEARANCEUR CLEAR 07/14/2016 1856   LABSPEC 1.020 07/14/2016 1856   PHURINE 7.0 07/14/2016 1856   GLUCOSEU NEGATIVE 07/14/2016 1856   HGBUR NEGATIVE 07/14/2016 1856   BILIRUBINUR NEGATIVE 07/14/2016 1856   KETONESUR NEGATIVE 07/14/2016 1856   PROTEINUR NEGATIVE 07/14/2016 1856    NITRITE NEGATIVE 07/14/2016 1856   LEUKOCYTESUR NEGATIVE 07/14/2016 1856   Radiology Studies: [name] Wo Contrast  Result Date: 07/16/2016 CLINICAL DATA:  Brain metastasis.  Metastatic lung cancer. EXAM: MRI HEAD WITHOUT AND WITH CONTRAST TECHNIQUE: Multiplanar, multiecho pulse sequences of the brain and surrounding structures were obtained without and with intravenous contrast. CONTRAST:  83mL MULTIHANCE GADOBENATE DIMEGLUMINE 529 MG/ML IV SOLN COMPARISON:  CT head without contrast 08/22/2011 FINDINGS: Brain: Multiple enhancing brain lesions are compatible with metastatic disease. The largest lesion is in the left occipital lobe measuring at 2.0 x 1.7 x 1.9 cm. There is surrounding vasogenic edema with effacement of the adjacent sulci. A 7 mm lesion is present anteriorly in the left frontal lobe on image 34 of series 11. Three additional 3 mm lesions are present. Lesions are present in the anterior inferior frontal lobes bilaterally on image 25 of series 11 and in the medial right parietal lobe  on image 35 of series 11. A 4 mm lesion is present along the inferior left temporal lobe. Vascular: Flow is present in the major intracranial arteries. Skull and upper cervical spine: The skullbase is within normal limits. The upper cervical spine is unremarkable. Sinuses/Orbits: The paranasal sinuses can the mastoid air cells are clear. Bilateral globes and orbits are within normal limits. IMPRESSION: 1. Six enhancing lesions scattered throughout the brain compatible with metastatic disease. The largest lesion involves the left occipital lobe. 2. Surrounding vasogenic edema involves the 2 largest lesions with mass effect in the left occipital left lobe evidence by effacement of the sulci. Electronically Signed   By: San Morelle M.D.   On: 07/16/2016 18:55   Ct Biopsy  Result Date: 07/16/2016 CLINICAL DATA:  Right middle lobe mass lesion with regional adenopathy. Destructive left ischial mass with soft  tissue component. EXAM: CT GUIDED CORE BIOPSY OF LEFT ISCHIAL MASS ANESTHESIA/SEDATION: Intravenous Fentanyl and Versed were administered as conscious sedation during continuous monitoring of the patient's level of consciousness and physiological / cardiorespiratory status by the radiology RN, with a total moderate sedation time of 10 minutes. PROCEDURE: The procedure risks, benefits, and alternatives were explained to the patient. Questions regarding the procedure were encouraged and answered. The patient understands and consents to the procedure. patient placed prone. Select axial scans obtained through the pelvis. The left ischial mass was identified and an appropriate skin entry site determined and marked. The operative field was prepped with chlorhexidinein a sterile fashion, and a sterile drape was applied covering the operative field. A sterile gown and sterile gloves were used for the procedure. Local anesthesia was provided with 1% Lidocaine. Under CT fluoroscopic guidance, a 17 gauge trocar needle was advanced to the margin of the lesion. Once needle tip position was confirmed, coaxial 18-gauge core biopsy samples were obtained, submitted in formalin to surgical pathology. The guide needle was removed. Postprocedure scans show no hemorrhage or other apparent complication. The patient tolerated the procedure well. COMPLICATIONS: None immediate FINDINGS: Lytic left ischial mass with extraosseous soft tissue component was again localized. Representative core biopsy samples obtained as above. IMPRESSION: 1. Technically successful core biopsy, left ischial mass. Electronically Signed   By: Lucrezia Europe M.D.   On: 07/16/2016 16:42   Scheduled Meds: . amLODipine  10 mg Oral QHS  . amoxicillin-clavulanate  1 tablet Oral Q12H  . dexamethasone  4 mg Intravenous TID  . enoxaparin (LOVENOX) injection  40 mg Subcutaneous Q24H  . gabapentin  300 mg Oral BID  . morphine  15 mg Oral Q12H  . polyethylene glycol  17  g Oral Daily  . pravastatin  20 mg Oral q1800  . senna-docusate  2 tablet Oral BID   Continuous Infusions: . sodium chloride    . 0.9 % NaCl with KCl 40 mEq / L 100 mL/hr (07/18/16 0940)  . methocarbamol (ROBAXIN)  IV 500 mg (07/15/16 0019)     LOS: 4 days   Time spent: 35 minutes   Faye Ramsay, MD [facility] Pager [phone]  If 7PM-7AM, please contact night-coverage [email or web address] Password [email or web address] 07/18/2016, 10:57 AM

## 2016-07-18 NOTE — Progress Notes (Signed)
   07/18/16 1200  Clinical Encounter Type  Visited With Patient  Visit Type Initial;Psychological support;Spiritual support  Referral From Nurse  Consult/Referral To Chaplain  Spiritual Encounters  Spiritual Needs Prayer;Emotional;Other (Comment) (Pastoral Conversation/Support )  Stress Factors  Patient Stress Factors Health changes;Major life changes;Other (Comment) (Physical Pain )   I visited with the patient per Spiritual Care consult for prayer due to a new diagnosis. The patient was in pain at the time of my visit, but requested that I pray with her.  Heather Hinton stated that she has been having very bad hip pain and that she is anxious about having radiation treatment. She states that she understands her diagnosis and is accepting of it. [name] told me that she was not having a good day today due to her pain. Her faith is very important to her and she wants God's will to be done.  [name] stated that she has good support and faith. She wants to remain hopeful.  She requested that I pray for God's will; and the possibility of healing if that is a part of God's plan.  I prayed at the bedside. [name] requested a follow-up visit.   Please, contact Spiritual Care for further assistance.  I will follow-up with the patient.   [place] M.Div.

## 2016-07-18 NOTE — Progress Notes (Signed)
PT Cancellation Note  Patient Details Name: Unice Vantassel MRN: [id number] DOB: 19-Jun-1960   Cancelled Treatment:     pt receiving Adivan and getting ready to go down stairs for a brain CT   Rica Koyanagi  PTA [facility]  Acute  Rehab Pager      [phone]

## 2016-07-18 NOTE — Progress Notes (Signed)
Heather Hinton   DOB:[birth year]   WE#:[id number]    Subjective: Her pain is reasonably controlled.  She denies headache.  No nausea.  Constipation has resolved  Objective:  Vitals:   07/17/16 2122 07/18/16 0544  BP: (!) 147/88 135/78  Pulse: (!) 101 91  Resp: 19 19  Temp: 97.8 F (36.6 C) 98 F (36.7 C)     Intake/Output Summary (Last 24 hours) at 07/18/16 1222 Last data filed at 07/18/16 8938  Gross per 24 hour  Intake             1937 ml  Output             2260 ml  Net             -323 ml    GENERAL:alert, no distress and comfortable SKIN: skin color, texture, turgor are normal, no rashes or significant lesions EYES: normal, Conjunctiva are pink and non-injected, sclera clear Musculoskeletal:no cyanosis of digits and no clubbing  NEURO: alert & oriented x 3 with fluent speech, no focal motor/sensory deficits   Labs:  Lab Results  Component Value Date   WBC 38.3 (H) 07/18/2016   HGB 10.3 (L) 07/18/2016   HCT 33.1 (L) 07/18/2016   MCV 80.1 07/18/2016   PLT 635 (H) 07/18/2016   NEUTROABS 28.0 (H) 07/16/2016    Lab Results  Component Value Date   NA 139 07/18/2016   K 4.6 07/18/2016   CL 110 07/18/2016   CO2 23 07/18/2016    Studies:  Mr Heather Hinton BO Contrast  Result Date: 07/16/2016 CLINICAL DATA:  Brain metastasis.  Metastatic lung cancer. EXAM: MRI HEAD WITHOUT AND WITH CONTRAST TECHNIQUE: Multiplanar, multiecho pulse sequences of the brain and surrounding structures were obtained without and with intravenous contrast. CONTRAST:  57mL MULTIHANCE GADOBENATE DIMEGLUMINE 529 MG/ML IV SOLN COMPARISON:  CT head without contrast 08/22/2011 FINDINGS: Brain: Multiple enhancing brain lesions are compatible with metastatic disease. The largest lesion is in the left occipital lobe measuring at 2.0 x 1.7 x 1.9 cm. There is surrounding vasogenic edema with effacement of the adjacent sulci. A 7 mm lesion is present anteriorly in the left frontal lobe on image 34 of series 11. Three  additional 3 mm lesions are present. Lesions are present in the anterior inferior frontal lobes bilaterally on image 25 of series 11 and in the medial right parietal lobe on image 35 of series 11. A 4 mm lesion is present along the inferior left temporal lobe. Vascular: Flow is present in the major intracranial arteries. Skull and upper cervical spine: The skullbase is within normal limits. The upper cervical spine is unremarkable. Sinuses/Orbits: The paranasal sinuses can the mastoid air cells are clear. Bilateral globes and orbits are within normal limits. IMPRESSION: 1. Six enhancing lesions scattered throughout the brain compatible with metastatic disease. The largest lesion involves the left occipital lobe. 2. Surrounding vasogenic edema involves the 2 largest lesions with mass effect in the left occipital left lobe evidence by effacement of the sulci. Electronically Signed   By: Heather Hinton M.D.   On: 07/16/2016 18:55   Ct Biopsy  Result Date: 07/16/2016 CLINICAL DATA:  Right middle lobe mass lesion with regional adenopathy. Destructive left ischial mass with soft tissue component. EXAM: CT GUIDED CORE BIOPSY OF LEFT ISCHIAL MASS ANESTHESIA/SEDATION: Intravenous Fentanyl and Versed were administered as conscious sedation during continuous monitoring of the patient's level of consciousness and physiological / cardiorespiratory status by the radiology RN, with a total moderate  sedation time of 10 minutes. PROCEDURE: The procedure risks, benefits, and alternatives were explained to the patient. Questions regarding the procedure were encouraged and answered. The patient understands and consents to the procedure. patient placed prone. Select axial scans obtained through the pelvis. The left ischial mass was identified and an appropriate skin entry site determined and marked. The operative field was prepped with chlorhexidinein a sterile fashion, and a sterile drape was applied covering the operative  field. A sterile gown and sterile gloves were used for the procedure. Local anesthesia was provided with 1% Lidocaine. Under CT fluoroscopic guidance, a 17 gauge trocar needle was advanced to the margin of the lesion. Once needle tip position was confirmed, coaxial 18-gauge core biopsy samples were obtained, submitted in formalin to surgical pathology. The guide needle was removed. Postprocedure scans show no hemorrhage or other apparent complication. The patient tolerated the procedure well. COMPLICATIONS: None immediate FINDINGS: Lytic left ischial mass with extraosseous soft tissue component was again localized. Representative core biopsy samples obtained as above. IMPRESSION: 1. Technically successful core biopsy, left ischial mass. Electronically Signed   By: Heather Hinton M.D.   On: 07/16/2016 16:42   Biopsy confirmed poorly differentiated carcinoma.  Further molecular studies are pending Assessment & Plan:   Lung masses, significant bone lesion, metastatic lung cancer until proven otherwise Unfortunately, MRI show cerebral metastasis.  Radiation oncologist will add radiation therapy to the brain in addition to other areas for palliative radiation Biopsy is confirmed diagnosis of poorly differentiated carcinoma. I recommend palliative care consult to assist in pain management Further studies are pending.  I will see her at the end of radiation treatment for further discussion about plan for chemotherapy.  Severe cancer pain She was started on MS Contin on 07/15/2016 with better pain control, along with low-dose dexamethasone Appreciate assistance from palliative care team for pain management  Severe anorexia, malignant cachexia, severe protein calorie malnutrition Hopefully, low-dose dexamethasone will help with appetite She went a dietitian consult for nutritional supplement  Severe microcytic anemia She has received blood transfusion She received 1 dose of IV iron yesterday on  07/17/2016  Severe constipation, resolved We will start her on laxative therapy  Discharge planning She is too ill to be discharged.  I recommend skilled nursing facility/rehab placement and she agreed I will consult social worker I will sign off.  Appointment has been arranged to see her in the outpatient clinic on 07/30/2016.  Please call if questions arise     Heath Lark, MD 07/18/2016  12:22 PM

## 2016-07-18 NOTE — Care Management Note (Signed)
Case Management Note  Patient Details  Name: Heather Hinton MRN: [id number] Date of Birth: 06-18-1960  Subjective/Objective:  56 yo admitted with metastatic lung cancer                  Action/Plan: From home with spouse. PT recommendations gone over with patient. Patient declines home health services but did request a rolling walker. Pt does have letter with her that her Medicaid was approved and her Medicaid ID given to [facility] rep for rolling walker ([id number]).  Expected Discharge Date:  07/17/16               Expected Discharge Plan:  Home/Self Care  In-House Referral:     Discharge planning Services  CM Consult  Post Acute Care Choice:  Home Health Choice offered to:  Patient  DME Arranged:  Walker rolling DME Agency:  [place]:    [facility] Agency:     Status of Service:  In process, will continue to follow  If discussed at Long Length of Stay Meetings, dates discussed:    Additional CommentsLynnell Catalan, RN 07/18/2016, 1:04 PM  [phone]

## 2016-07-19 ENCOUNTER — Ambulatory Visit
Admit: 2016-07-19 | Discharge: 2016-07-19 | Disposition: A | Discharge status: Home / Self Care | Payer: Medicaid Other | Attending: Radiation Oncology | Admitting: Radiation Oncology

## 2016-07-19 DIAGNOSIS — Z510 Encounter for antineoplastic radiation therapy: Secondary | ICD-10-CM | POA: Diagnosis not present

## 2016-07-19 DIAGNOSIS — Z7189 Other specified counseling: Secondary | ICD-10-CM

## 2016-07-19 LAB — BASIC METABOLIC PANEL
Anion gap: 8 (ref 5–15)
BUN: 15 mg/dL (ref 6–20)
CHLORIDE: 107 mmol/L (ref 101–111)
CO2: 22 mmol/L (ref 22–32)
CREATININE: 0.72 mg/dL (ref 0.44–1.00)
Calcium: 9.1 mg/dL (ref 8.9–10.3)
Glucose, Bld: 123 mg/dL — ABNORMAL HIGH (ref 65–99)
Potassium: 4.6 mmol/L (ref 3.5–5.1)
SODIUM: 137 mmol/L (ref 135–145)

## 2016-07-19 LAB — CBC
HCT: 34.5 % — ABNORMAL LOW (ref 36.0–46.0)
HEMOGLOBIN: 10.6 g/dL — AB (ref 12.0–15.0)
MCH: 24.6 pg — ABNORMAL LOW (ref 26.0–34.0)
MCHC: 30.7 g/dL (ref 30.0–36.0)
MCV: 80 fL (ref 78.0–100.0)
PLATELETS: 632 10*3/uL — AB (ref 150–400)
RBC: 4.31 MIL/uL (ref 3.87–5.11)
RDW: 16.5 % — ABNORMAL HIGH (ref 11.5–15.5)
WBC: 40.1 10*3/uL — ABNORMAL HIGH (ref 4.0–10.5)

## 2016-07-19 MED ORDER — BOOST / RESOURCE BREEZE PO LIQD
1.0000 | Freq: Three times a day (TID) | ORAL | Status: DC
  Administered 2016-07-19 – 2016-07-23 (×8): 1 via ORAL

## 2016-07-19 NOTE — Progress Notes (Signed)
Daily Progress Note   Patient Name: Heather Hinton       Date: 07/19/2016 DOB: 1960/05/28  Age: 56 y.o. 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  Subjective: Pain is well controlled but reports that she had some worsening of pain this AM.  It did resolve with oral rescue medications.  She remains in good spirits and denies other needs today.  We also discussed again about beginning the process of advance care planning.  See below  Length of Stay: 5  Current Medications: Scheduled Meds:  . amLODipine  10 mg Oral QHS  . amoxicillin-clavulanate  1 tablet Oral Q12H  . dexamethasone  4 mg Intravenous TID  . enoxaparin (LOVENOX) injection  40 mg Subcutaneous Q24H  . gabapentin  300 mg Oral BID  . morphine  15 mg Oral Q12H  . polyethylene glycol  17 g Oral Daily  . pravastatin  20 mg Oral q1800  . senna-docusate  2 tablet Oral BID    Continuous Infusions: . sodium chloride    . 0.9 % NaCl with KCl 40 mEq / L 75 mL/hr (07/19/16 0238)  . methocarbamol (ROBAXIN)  IV 500 mg (07/15/16 0019)    PRN Meds: acetaminophen **OR** acetaminophen, HYDROcodone-acetaminophen, methocarbamol (ROBAXIN)  IV, morphine injection, ondansetron **OR** ondansetron (ZOFRAN) IV  Physical Exam         Sleepy but arouses easily. resting in bed Lying with L hip up S1 S2 Clear Abdomen soft No edema Non focal  Vital Signs: BP 117/68 (BP Location: Left Arm)   Pulse 85   Temp 98.3 F (36.8 C) (Oral)   Resp 19   Ht 5\' 7"  (1.702 m)   Wt 69.4 kg (153 lb)   SpO2 98%    BMI 23.96 kg/m  SpO2: SpO2: 98 % O2 Device: O2 Device: Not Delivered O2 Flow Rate: O2 Flow Rate (L/min): 2 L/min  Intake/output summary:   Intake/Output Summary (Last 24 hours) at 07/19/16 1009 Last data filed at 07/19/16 0834  Gross per 24 hour  Intake             1140 ml  Output  2800 ml  Net            -1660 ml   LBM: Last BM Date: 07/19/16 Baseline Weight: Weight: 69.4 kg (153 lb) Most recent weight: Weight: 69.4 kg (153 lb)       Palliative Assessment/Data:    Flowsheet Rows     Most Recent Value  Intake Tab  Referral Department  Hospitalist  Unit at Time of Referral  Oncology Unit  Palliative Care Primary Diagnosis  Cancer  Date Notified  07/15/16  Palliative Care Type  New Palliative care  Reason for referral  Pain  Date of Admission  07/14/16  Date first seen by Palliative Care  07/15/16  # of days Palliative referral response time  0 Day(s)  # of days IP prior to Palliative referral  1  Clinical Assessment  Palliative Performance Scale Score  50%  Pain Max last 24 hours  10  Pain Min Last 24 hours  6  Dyspnea Max Last 24 Hours  0  Dyspnea Min Last 24 hours  0  Nausea Max Last 24 Hours  2  Nausea Min Last 24 Hours  0  Anxiety Max Last 24 Hours  0  Anxiety Min Last 24 Hours  0  Other Max Last 24 Hours  0  Psychosocial & Spiritual Assessment  Palliative Care Outcomes  Patient/Family meeting held?  Yes  Who was at the meeting?  pt and multiple family members  Palliative Care Outcomes  Improved pain interventions  Palliative Care follow-up planned  Yes, Facility      Patient Active Problem List   Diagnosis Date Noted  . Iron deficiency anemia   . Metastasis to brain ([place])   . 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 Metastasis to bone ([place])   . Left hip pain   . Palliative care by specialist   . Metastatic lung cancer (metastasis from lung to other site) ([place]) 07/14/2016  . Essential hypertension 07/14/2016  . Hypokalemia 07/14/2016  .  Hyperglycemia 07/14/2016  . Symptomatic anemia 07/14/2016  . 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  She reports some pain this AM, but it improved after taking her oral rescue medication. Discussed use of pain medication prior to activities that cause exacerbation of pain, such as lying on table for radiation.  I did leave IV medication to use as second line medication in case oral route is ineffective, but I would recommend planning to discharge her on current regimen of Heather Contin and Norco.  Continue anti emetic regimen  Constipation: Resolved per patient report. Opioid related.  Continue senna. Continue miralax.   Continue to support the patient holistically.   Goals of Care and Additional Recommendations:  Limitations on Scope of Treatment: Full Scope Treatment   We discussed today regarding advance care planning, including HCPOA and code status.  Her husband is her surrogate Media planner.  She will need to discuss further with her family regarding her goals of care, including code status.  She is invested in plan to complete radiation and see Dr. Alvy Bimler in follow-up prior to making any further changes to her care plan.  Brief life review: Heather Hinton is from [place], [place], her 56 year old grand daughter lives with her, Heather Hinton is happy today, as her 42  year old grand daughter is graduating 7th grade today, with good grades and perfect attendance. Heather Hinton states that her faith is a great source of support and strength for her, " you know, God's got His Hand over me." provided her with active listening and supportive care.   Code Status:    Code Status Orders        Start     Ordered   07/14/16 2325  Full code  Continuous     07/14/16  2324    Code Status History    Date Active Date Inactive Code Status Order ID Comments User Context   This patient has a current code status but no historical code status.       Prognosis:   Unable to determine  Discharge Planning:  Patient reports plan for home on discharge  Care plan was discussed with patient, RN    Thank you for allowing the Palliative Medicine Team to assist in the care of this patient.   Time In: 0935 Time Out: 1010 Total Time 35 Prolonged Time Billed  no       Greater than 50%  of this time was spent counseling and coordinating care related to the above assessment and plan.  Micheline Rough, MD [phone]  Please contact Palliative Medicine Team phone at [phone] for questions and concerns.

## 2016-07-19 NOTE — Progress Notes (Signed)
Patient ID: Heather Hinton, female   DOB: [birth year], 56 y.o.   MRN: [id number]    PROGRESS NOTE    Shatasia Cutshaw  FBP:[id number] DOB: 01/22/1961 DOA: 07/14/2016  PCP: Neale Burly, MD    Brief Narrative:  Patient is 56 year old female with known hypertension and recent history of spinal stenosis, presented with weakness, left hip pain, fevers and chills. Initial imaging studies notable for probable primary lung malignancy with metastatic disease in the ischial tuberosity on the left with significant tumor burden as well as metastatic disease and brain. Radiation oncology consulted, recommendation was to wait for pathology results to be back so that we can determine how to move forward with radiation. Patient may be candidate for stereotactic radiation as opposed to whole brain radiation. Patient is now status post biopsy of the left ischial mass, done on 07/16/2016 by interventional radiologist. Palliative care team also consulted for assistance on pain management.  Assessment and plan:  Lung mass, significant bone lesion, worrisome for metastatic lung cancer until proven otherwise - Additional imaging studies including MRI, also notable for cerebral metastases - Radiation oncology already consulted, further recommendations pending biopsy results - Oncologist also following, assistance appreciated, outpatient appointment scheduled on June 18th, 2018 - Palliative care team also following, assistance appreciated - Continue dexamethasone 4 mg 3 times a day IV for now  Postobstructive pneumonia, right middle lobe - Initially started on IV antibiotics Zosyn on admissionand was successfully transitioned to Augmentin on 07/17/2016, will continue same regimen for now  - Currently afebrile - WBC continue trending up and suspect from malignancy and steroids  - O2 sats stable   Severe cancer pain - Started on MS Contin 15 mg twice a day by mouth - controlled on current analgesia   Severe  protein calorie malnutrition, malignant cachexia - continue nutritional supplements   Severe microcytic anemia - Iron studies confirm evidence of iron deficiency as well as anemia of chronic disease - This is most likely secondary to malignancy as well as chronic blood loss, malabsorption syndrome - Patient has received one dose of IV iron 07/17/2016 per oncologist recommendation - Hg remains overall stable   Essential hypertension - reasonable control on Norvasc   Hyperlipidemia - Continue statin  History of smoking tobacco - Over 40 years, already counseled on cessation   DVT prophylaxis: Lovenox Code Status: Full Family Communication: Patient at bedside  Disposition Plan: once cleared by oncology and Rad onc teams   Consultants:   Oncology  Radiation oncology  Palliative care  Procedures:   Left ischial bone biopsy on 07/16/2016  Antimicrobials:   Zosyn 07/15/2016 --> 07/17/2016  Augmentin 07/17/2016 -->   Subjective: Pt reports feeling better.   Objective: Vitals:   07/18/16 2030 07/18/16 2209 07/19/16 0528 07/19/16 1345  BP: 129/68 134/80 117/68 131/73  Pulse: 92 (!) 104 85 88  Resp: 19   18  Temp: 98.2 F (36.8 C)  98.3 F (36.8 C) 98 F (36.7 C)  TempSrc: Oral  Oral Oral  SpO2: 98%  98% 100%  Weight:      Height:        Intake/Output Summary (Last 24 hours) at 07/19/16 1645 Last data filed at 07/19/16 1526  Gross per 24 hour  Intake             1985 ml  Output             2600 ml  Net             -  615 ml   Filed Weights   07/14/16 1553  Weight: 69.4 kg (153 lb)   Physical Exam  Constitutional: Appears well-developed and well-nourished. No distress.  CVS: RRR, S1/S2 +, no murmurs, no gallops, no carotid bruit.  Pulmonary: Effort and breath sounds normal, no stridor, rhonchi, wheezes, rales.  Abdominal: Soft. BS +,  no distension, tenderness, rebound or guarding.   Data Reviewed: I have personally reviewed following labs and imaging  studies  CBC:  Recent Labs Lab 07/14/16 1612 07/15/16 1157 07/16/16 0440 07/17/16 0352 07/18/16 0415 07/19/16 0358  WBC 22.4* 29.1* 31.1* 32.8* 38.3* 40.1*  NEUTROABS 17.7*  --  28.0*  --   --   --   HGB 7.9* 10.6* 10.4* 10.2* 10.3* 10.6*  HCT 25.9* 31.9* 32.0* 32.1* 33.1* 34.5*  MCV 79.0 76.7* 78.4 78.9 80.1 80.0  PLT 743* 660* 632* 624* 635* 308*   Basic Metabolic Panel:  Recent Labs Lab 07/14/16 1612 07/15/16 1157 07/16/16 0440 07/17/16 0352 07/18/16 0415 07/19/16 0358  NA 142 139 140 139 139 137  K 2.5* 2.8* 3.9 4.1 4.6 4.6  CL 104 104 106 106 110 107  CO2 25 25 24 23 23 22   GLUCOSE 115* 120* 125* 133* 130* 123*  BUN 12 7 12 19 15 15   CREATININE 0.82 0.67 0.70 0.89 0.65 0.72  CALCIUM 9.1 8.5* 8.7* 8.5* 8.7* 9.1  MG 2.1  --   --   --   --   --    Liver Function Tests:  Recent Labs Lab 07/14/16 1612 07/16/16 0440  AST 14* 10*  ALT 11* 11*  ALKPHOS 174* 138*  BILITOT 0.6 0.6  PROT 8.1 7.1  ALBUMIN 2.5* 2.3*   Coagulation Profile:  Recent Labs Lab 07/16/16 0440  INR 1.19   Anemia Panel: No results for input(s): VITAMINB12, FOLATE, FERRITIN, TIBC, IRON, RETICCTPCT in the last 72 hours. Urine analysis:    Component Value Date/Time   COLORURINE STRAW (A) 07/14/2016 1856   APPEARANCEUR CLEAR 07/14/2016 1856   LABSPEC 1.020 07/14/2016 1856   PHURINE 7.0 07/14/2016 1856   GLUCOSEU NEGATIVE 07/14/2016 1856   HGBUR NEGATIVE 07/14/2016 1856   BILIRUBINUR NEGATIVE 07/14/2016 1856   KETONESUR NEGATIVE 07/14/2016 1856   PROTEINUR NEGATIVE 07/14/2016 1856   NITRITE NEGATIVE 07/14/2016 1856   LEUKOCYTESUR NEGATIVE 07/14/2016 1856   Radiology Studies: [name] Wo Contrast  Result Date: 07/18/2016 CLINICAL DATA:  57 year old female with lung cancer and MRI positive for brain metastases recently. Study for stereotactic radiosurgery planning. By report, the patient had an iron infusion yesterday. EXAM: MRI HEAD WITHOUT AND WITH CONTRAST TECHNIQUE:  Multiplanar, multiecho pulse sequences of the brain and surrounding structures were obtained without and with intravenous contrast. CONTRAST:  51mL MULTIHANCE GADOBENATE DIMEGLUMINE 529 MG/ML IV SOLN COMPARISON:  Brain MRI 07/16/2016. FINDINGS: Brain: Susceptibility artifact related to recent iron infusion, although not severe on T1 weighted imaging. The 6 enhancing brain metastases demonstrated on 07/16/2016 are stable in size and configuration, although their margins are indistinct today. The largest lesion in the left occipital lobe may have a small adjacent 3 mm metastasis as seen on series 11, image 67 today. No new brain metastasis is identified. Vasogenic edema associated with the lesions is unchanged, and maximal in the left occipital lobe. Mild regional mass effect there, but otherwise no significant intracranial mass effect. No midline shift. Patent basilar cisterns. Abnormal T2 and FLAIR hyperintensity extending from the left cerebellar peduncle into the left lateral cerebellar hemisphere is unchanged  and more resembles encephalomalacia. No dural or leptomeningeal enhancement is evident on these recent MRIs. No restricted diffusion or evidence of acute infarction. No acute intracranial hemorrhage is evident. T2* imaging is degraded. Vascular: Major intracranial vascular flow voids are stable. Skull and upper cervical spine: Stable bone marrow signal. Negative visualized cervical spine. Sinuses/Orbits: Stable and negative. Other: Mastoids remain clear.  Negative scalp soft tissues. IMPRESSION: 1. Susceptibility artifact related to iron infusion yesterday. The brain metastases identified on 07/16/2016 are stable, but their margins are less distinct due to the iron artifact. Note that there may be a small subcentimeter metastasis adjacent to the largest mass in the left occipital lobe (series 11, image 67). Stable vasogenic edema and mild intracranial mass effect. 2. No new brain metastasis identified. 3.  Stable signal abnormality along the left cerebellar peduncle and left lateral cerebellum which most resembles encephalomalacia. Electronically Signed   By: Genevie Ann M.D.   On: 07/18/2016 15:42   Scheduled Meds: . amLODipine  10 mg Oral QHS  . amoxicillin-clavulanate  1 tablet Oral Q12H  . dexamethasone  4 mg Intravenous TID  . enoxaparin (LOVENOX) injection  40 mg Subcutaneous Q24H  . feeding supplement  1 Container Oral TID BM  . gabapentin  300 mg Oral BID  . morphine  15 mg Oral Q12H  . polyethylene glycol  17 g Oral Daily  . pravastatin  20 mg Oral q1800  . senna-docusate  2 tablet Oral BID   Continuous Infusions: . sodium chloride    . 0.9 % NaCl with KCl 40 mEq / L 75 mL/hr (07/19/16 0238)  . methocarbamol (ROBAXIN)  IV 500 mg (07/15/16 0019)     LOS: 5 days   Time spent: 25 minutes   Faye Ramsay, MD [facility] Pager [phone]  If 7PM-7AM, please contact night-coverage [email or web address] Password [email or web address] 07/19/2016, 4:45 PM

## 2016-07-19 NOTE — Progress Notes (Signed)
Initial Nutrition Assessment  DOCUMENTATION CODES:   Severe malnutrition in context of acute illness/injury  INTERVENTION:   -Provide Boost Breeze po TID, each supplement provides 250 kcal and 9 grams of protein -Provided brief diet education regarding protein foods and small ,frequent meals -Provided strategies to help with taste alterations -Encouraged PO and supplement intake -RD to continue to monitor  NUTRITION DIAGNOSIS:   Malnutrition (Severe) related to cancer and cancer related treatments, acute illness as evidenced by percent weight loss, moderate depletions of muscle mass, energy intake < or equal to 50% for > or equal to 5 days.  GOAL:   Patient will meet greater than or equal to 90% of their needs  MONITOR:   PO intake, Supplement acceptance, Labs, Weight trends, I & O's  REASON FOR ASSESSMENT:   Consult Assessment of nutrition requirement/status  ASSESSMENT:   56 year old female with known hypertension and recent history of spinal stenosis, presented with weakness, left hip pain, fevers and chills. Initial imaging studies notable for probable primary lung malignancy with metastatic disease in the ischial tuberosity on the left with significant tumor burden as well as metastatic disease and brain. Radiation oncology consulted, recommendation was to wait for pathology results to be back so that we can determine how to move forward with radiation. Patient may be candidate for stereotactic radiation as opposed to whole brain radiation. Patient is now status post biopsy of the left ischial mass, done on 07/16/2016 by interventional radiologist. Palliative care team also consulted for assistance on pain management.  Patient in room with no family at bedside. Pt reports poor appetite related to taste changes. States foods taste metallic-like. Pt states she has been drinking sodas from cans and advised her to only drink soda from plastic bottles and to use plastic silverware  with meals. Pt doesn't like milk and it's consistency. RD reviewed high protein foods with patient and discussed small, frequent meals. Provided high protein snack options. Pt denies issues swallowing or chewing. Pt is willing to try Boost Breeze supplements.   PO intake: 50-100% of meals.  Pt reports UBW of 170-173 lb. Pt has lost 19 lb since 3/22 (11 % wt loss x 2.5 months, significant for time frame). Nutrition-Focused physical exam completed. Findings are no fat depletion, mild-moderate muscle depletion, and no edema. Pt has noticed weakness in her legs.   Labs reviewed. Medications: IV Decadron TID, Miralax packet daily, Senokot-S BID  Diet Order:  Diet regular Room service appropriate? Yes; Fluid consistency: Thin  Skin:  Reviewed, no issues  Last BM:  6/7  Height:   Ht Readings from Last 1 Encounters:  07/14/16 5\' 7"  (1.702 m)    Weight:   Wt Readings from Last 1 Encounters:  07/14/16 153 lb (69.4 kg)    Ideal Body Weight:  61.4 kg  BMI:  Body mass index is 23.96 kg/m.  Estimated Nutritional Needs:   Kcal:  1850-2050  Protein:  85-95g  Fluid:  2L/day  EDUCATION NEEDS:   Education needs addressed  Clayton Bibles, MS, RD, LDN Pager: [phone] After Hours Pager: [phone]

## 2016-07-19 NOTE — Progress Notes (Signed)
Physical Therapy Treatment and Discharge from Acute PT Patient Details Name: Heather Hinton MRN: [id number] DOB: [birth year] Today's Date: 07/19/2016    History of Present Illness 56 yo female admitted with severe Left hip pain.  Found to have brain and Lung masses, significant bone lesion left hip, metastatic lung cancer    PT Comments    Pt ambulated well with RW and reports no further acute PT needs at this time.  Pt agreeable to ambulate with staff during acute stay.  Verbally discussed safe step technique and pt reported understanding.  Pt reports she is considering HHPT, remains uncertain at this time.  Since pt mobilizing well and requests no further acute PT, PT to sign off at this time.   Follow Up Recommendations  Supervision/Assistance - 24 hour;Home health PT (pt reports she is still thinking about HHPT)     Equipment Recommendations  Rolling walker with 5" wheels    Recommendations for Other Services       Precautions / Restrictions Precautions Precautions: Fall    Mobility  Bed Mobility Overal bed mobility: Independent                Transfers Overall transfer level: Needs assistance Equipment used: Rolling walker (2 wheeled) Transfers: Sit to/from Stand Sit to Stand: Supervision            Ambulation/Gait Ambulation/Gait assistance: Supervision Ambulation Distance (Feet): 400 Feet Assistive device: Rolling walker (2 wheeled) Gait Pattern/deviations: Step-through pattern;Decreased stance time - left;Antalgic     General Gait Details: verbal cues for RW positioning and posture, encouraged UE WBing on RW to assist with alleviating L hip pain   Stairs Stairs:  (verbally reviewed safe step to technique, pt reports understanding)          Wheelchair Mobility    Modified Rankin (Stroke Patients Only)       Balance                                            Cognition Arousal/Alertness: Awake/alert Behavior During  Therapy: WFL for tasks assessed/performed Overall Cognitive Status: Within Functional Limits for tasks assessed                                        Exercises      General Comments        Pertinent Vitals/Pain Pain Assessment: 0-10 Pain Score: 4  Pain Location: L hip with mobility Pain Descriptors / Indicators: Aching;Sore Pain Intervention(s): Limited activity within patient's tolerance;Monitored during session;Repositioned    Home Living                      Prior Function            PT Goals (current goals can now be found in the care plan section) Progress towards PT goals: Goals met/education completed, patient discharged from PT    Frequency    Min 3X/week      PT Plan Other (comment) (d/c from acute PT)    Co-evaluation              AM-PAC PT "6 Clicks" Daily Activity  Outcome Measure  Difficulty turning over in bed (including adjusting bedclothes, sheets and blankets)?: None Difficulty moving from lying on back to sitting  on the side of the bed? : None Difficulty sitting down on and standing up from a chair with arms (e.g., wheelchair, bedside commode, etc,.)?: None Help needed moving to and from a bed to chair (including a wheelchair)?: None Help needed walking in hospital room?: A Little Help needed climbing 3-5 steps with a railing? : A Little 6 Click Score: 22    End of Session   Activity Tolerance: Patient tolerated treatment well Patient left: in bed;with call bell/phone within reach   PT Visit Diagnosis: Unsteadiness on feet (R26.81)     Time: 1030-1046 PT Time Calculation (min) (ACUTE ONLY): 16 min  Charges:  $Gait Training: 8-22 mins                    G Codes:       Heather Hinton, PT, DPT 07/19/2016 Pager: [phone]  Heather Hinton E 07/19/2016, 1:29 PM

## 2016-07-19 NOTE — Progress Notes (Signed)
CSW consulted for potential SNF placement. PT eval recommended HH. Pt has MCD coverage. Met with pt who was receptive to CSW involvement. CSW explained process of SNF placement with MCD coverage (including pt must stay 30 days and sign income to facility during her stay). Pt states she is uninterested in SNF placement, stating she has husband and granddaughter at home, has supportive family around, transportation to radiation appointments, and is ambulating with PT while in hospital.  CSW discussed possible benefits of 24/7 supervision at SNF however pt states she will have that at home as well. States she plans to go home at DC.  Pt agreed to communicate to CSW if her circumstances or wishes for DC plan change.  Sharren Bridge, MSW, LCSW Clinical Social Work 07/19/2016 [phone]

## 2016-07-20 ENCOUNTER — Ambulatory Visit
Admit: 2016-07-20 | Discharge: 2016-07-20 | Disposition: A | Discharge status: Home / Self Care | Payer: Medicaid Other | Attending: Radiation Oncology | Admitting: Radiation Oncology

## 2016-07-20 DIAGNOSIS — Z510 Encounter for antineoplastic radiation therapy: Secondary | ICD-10-CM | POA: Diagnosis not present

## 2016-07-20 LAB — CBC
HCT: 34.9 % — ABNORMAL LOW (ref 36.0–46.0)
HEMOGLOBIN: 10.9 g/dL — AB (ref 12.0–15.0)
MCH: 25.3 pg — AB (ref 26.0–34.0)
MCHC: 31.2 g/dL (ref 30.0–36.0)
MCV: 81.2 fL (ref 78.0–100.0)
Platelets: 617 10*3/uL — ABNORMAL HIGH (ref 150–400)
RBC: 4.3 MIL/uL (ref 3.87–5.11)
RDW: 17.2 % — ABNORMAL HIGH (ref 11.5–15.5)
WBC: 49 10*3/uL — AB (ref 4.0–10.5)

## 2016-07-20 LAB — BASIC METABOLIC PANEL
Anion gap: 9 (ref 5–15)
BUN: 22 mg/dL — AB (ref 6–20)
CHLORIDE: 107 mmol/L (ref 101–111)
CO2: 21 mmol/L — ABNORMAL LOW (ref 22–32)
Calcium: 9 mg/dL (ref 8.9–10.3)
Creatinine, Ser: 0.66 mg/dL (ref 0.44–1.00)
GFR calc Af Amer: >60 mL/min (ref >60)
GFR calc non Af Amer: >60 mL/min (ref >60)
GLUCOSE: 127 mg/dL — AB (ref 65–99)
POTASSIUM: 4.7 mmol/L (ref 3.5–5.1)
SODIUM: 137 mmol/L (ref 135–145)

## 2016-07-20 NOTE — Progress Notes (Signed)
Patient ID: Heather Hinton, female   DOB: [birth year], 56 y.o.   MRN: [id number]    PROGRESS NOTE    Heather Hinton  ZHY:[id number] DOB: 06/07/1960 DOA: 07/14/2016  PCP: Neale Burly, MD    Brief Narrative:  Patient is 56 year old female with known hypertension and recent history of spinal stenosis, presented with weakness, left hip pain, fevers and chills. Initial imaging studies notable for probable primary lung malignancy with metastatic disease in the ischial tuberosity on the left with significant tumor burden as well as metastatic disease and brain. Radiation oncology consulted, recommendation was to wait for pathology results to be back so that we can determine how to move forward with radiation. Patient may be candidate for stereotactic radiation as opposed to whole brain radiation. Patient is now status post biopsy of the left ischial mass, done on 07/16/2016 by interventional radiologist. Palliative care team also consulted for assistance on pain management.  Assessment and plan:  Lung mass, significant bone lesion, worrisome for metastatic lung cancer until proven otherwise - Additional imaging studies including MRI, also notable for cerebral metastases - Radiation oncology already consulted, further recommendations pending biopsy results - Oncologist also following, assistance appreciated, outpatient appointment scheduled on June 18th, 2018 - Palliative care team also following, assistance appreciated - Continue dexamethasone 4 mg 3 times daily, if patient tolerating by mouth intake, can change to by mouth  Postobstructive pneumonia, right middle lobe - Initially started on IV antibiotics Zosyn on admissionand was successfully transitioned to Augmentin on 07/17/2016, will continue same regimen for now  - Remains afebrile and with stable heart rate and oxygen saturations - White blood cell count continuing to trend up - We will repeat CBC in the morning  Severe cancer pain -  Started on MS Contin 15 mg twice a day by mouth - Current analgesia controls pain  Severe protein calorie malnutrition, malignant cachexia - continue nutritional supplements   Thrombocytosis - Suspect this is reactive from malignant process - CBC in the morning  Severe microcytic anemia - Iron studies confirm evidence of iron deficiency as well as anemia of chronic disease - This is most likely secondary to malignancy as well as chronic blood loss, malabsorption syndrome - Patient has received one dose of IV iron 07/17/2016 per oncologist recommendation - No evidence of acute bleeding - Hemoglobin overall stable  Essential hypertension - reasonable control on Norvasc   Hyperlipidemia - Continue statin  History of smoking tobacco - Over 40 years, already counseled on cessation   DVT prophylaxis: Lovenox Code Status: Full Family Communication: Patient at bedside  Disposition Plan: Suspect patient can be discharged in the morning  Consultants:   Oncology  Radiation oncology  Palliative care  Procedures:   Left ischial bone biopsy on 07/16/2016  Antimicrobials:   Zosyn 07/15/2016 --> 07/17/2016  Augmentin 07/17/2016 -->   Subjective: Patient reports feeling better but oral intake is still rather poor.  Objective: Vitals:   07/19/16 0528 07/19/16 1345 07/19/16 2236 07/20/16 0605  BP: 117/68 131/73 129/83 130/87  Pulse: 85 88 91 88  Resp:  18 16 16   Temp: 98.3 F (36.8 C) 98 F (36.7 C) 97.9 F (36.6 C) 98.4 F (36.9 C)  TempSrc: Oral Oral Oral Oral  SpO2: 98% 100% 99% 99%  Weight:      Height:        Intake/Output Summary (Last 24 hours) at 07/20/16 1507 Last data filed at 07/20/16 1146  Gross per 24 hour  Intake  960 ml  Output             1500 ml  Net             -540 ml   Filed Weights   07/14/16 1553  Weight: 69.4 kg (153 lb)   Physical Exam  Constitutional: Appears well-developed and well-nourished. No distress.  CVS:  RRR, S1/S2 +, no murmurs, no gallops, no carotid bruit.  Pulmonary: Effort and breath sounds normal, no stridor, rhonchi, wheezes, rales.  Abdominal: Soft. BS +,  no distension, tenderness, rebound or guarding.  Musculoskeletal: Persistent tenderness in the left hip area   Data Reviewed: I have personally reviewed following labs and imaging studies  CBC:  Recent Labs Lab 07/14/16 1612  07/16/16 0440 07/17/16 0352 07/18/16 0415 07/19/16 0358 07/20/16 0402  WBC 22.4*  < > 31.1* 32.8* 38.3* 40.1* 49.0*  NEUTROABS 17.7*  --  28.0*  --   --   --   --   HGB 7.9*  < > 10.4* 10.2* 10.3* 10.6* 10.9*  HCT 25.9*  < > 32.0* 32.1* 33.1* 34.5* 34.9*  MCV 79.0  < > 78.4 78.9 80.1 80.0 81.2  PLT 743*  < > 632* 624* 635* 632* 617*  < > = values in this interval not displayed. Basic Metabolic Panel:  Recent Labs Lab 07/14/16 1612  07/16/16 0440 07/17/16 0352 07/18/16 0415 07/19/16 0358 07/20/16 0402  NA 142  < > 140 139 139 137 137  K 2.5*  < > 3.9 4.1 4.6 4.6 4.7  CL 104  < > 106 106 110 107 107  CO2 25  < > 24 23 23 22  21*  GLUCOSE 115*  < > 125* 133* 130* 123* 127*  BUN 12  < > 12 19 15 15  22*  CREATININE 0.82  < > 0.70 0.89 0.65 0.72 0.66  CALCIUM 9.1  < > 8.7* 8.5* 8.7* 9.1 9.0  MG 2.1  --   --   --   --   --   --   < > = values in this interval not displayed. Liver Function Tests:  Recent Labs Lab 07/14/16 1612 07/16/16 0440  AST 14* 10*  ALT 11* 11*  ALKPHOS 174* 138*  BILITOT 0.6 0.6  PROT 8.1 7.1  ALBUMIN 2.5* 2.3*   Coagulation Profile:  Recent Labs Lab 07/16/16 0440  INR 1.19   Urine analysis:    Component Value Date/Time   COLORURINE STRAW (A) 07/14/2016 1856   APPEARANCEUR CLEAR 07/14/2016 1856   LABSPEC 1.020 07/14/2016 1856   PHURINE 7.0 07/14/2016 1856   GLUCOSEU NEGATIVE 07/14/2016 1856   HGBUR NEGATIVE 07/14/2016 1856   BILIRUBINUR NEGATIVE 07/14/2016 1856   KETONESUR NEGATIVE 07/14/2016 1856   PROTEINUR NEGATIVE 07/14/2016 1856   NITRITE  NEGATIVE 07/14/2016 1856   LEUKOCYTESUR NEGATIVE 07/14/2016 1856   Radiology Studies: [name] Wo Contrast  Result Date: 07/18/2016 CLINICAL DATA:  56 year old female with lung cancer and MRI positive for brain metastases recently. Study for stereotactic radiosurgery planning. By report, the patient had an iron infusion yesterday. EXAM: MRI HEAD WITHOUT AND WITH CONTRAST TECHNIQUE: Multiplanar, multiecho pulse sequences of the brain and surrounding structures were obtained without and with intravenous contrast. CONTRAST:  8mL MULTIHANCE GADOBENATE DIMEGLUMINE 529 MG/ML IV SOLN COMPARISON:  Brain MRI 07/16/2016. FINDINGS: Brain: Susceptibility artifact related to recent iron infusion, although not severe on T1 weighted imaging. The 6 enhancing brain metastases demonstrated on 07/16/2016 are stable in size and configuration, although  their margins are indistinct today. The largest lesion in the left occipital lobe may have a small adjacent 3 mm metastasis as seen on series 11, image 67 today. No new brain metastasis is identified. Vasogenic edema associated with the lesions is unchanged, and maximal in the left occipital lobe. Mild regional mass effect there, but otherwise no significant intracranial mass effect. No midline shift. Patent basilar cisterns. Abnormal T2 and FLAIR hyperintensity extending from the left cerebellar peduncle into the left lateral cerebellar hemisphere is unchanged and more resembles encephalomalacia. No dural or leptomeningeal enhancement is evident on these recent MRIs. No restricted diffusion or evidence of acute infarction. No acute intracranial hemorrhage is evident. T2* imaging is degraded. Vascular: Major intracranial vascular flow voids are stable. Skull and upper cervical spine: Stable bone marrow signal. Negative visualized cervical spine. Sinuses/Orbits: Stable and negative. Other: Mastoids remain clear.  Negative scalp soft tissues. IMPRESSION: 1. Susceptibility artifact  related to iron infusion yesterday. The brain metastases identified on 07/16/2016 are stable, but their margins are less distinct due to the iron artifact. Note that there may be a small subcentimeter metastasis adjacent to the largest mass in the left occipital lobe (series 11, image 67). Stable vasogenic edema and mild intracranial mass effect. 2. No new brain metastasis identified. 3. Stable signal abnormality along the left cerebellar peduncle and left lateral cerebellum which most resembles encephalomalacia. Electronically Signed   By: Genevie Ann M.D.   On: 07/18/2016 15:42   Scheduled Meds: . amLODipine  10 mg Oral QHS  . amoxicillin-clavulanate  1 tablet Oral Q12H  . dexamethasone  4 mg Intravenous TID  . enoxaparin (LOVENOX) injection  40 mg Subcutaneous Q24H  . feeding supplement  1 Container Oral TID BM  . gabapentin  300 mg Oral BID  . morphine  15 mg Oral Q12H  . polyethylene glycol  17 g Oral Daily  . pravastatin  20 mg Oral q1800  . senna-docusate  2 tablet Oral BID   Continuous Infusions: . sodium chloride    . 0.9 % NaCl with KCl 40 mEq / L 75 mL/hr (07/19/16 1824)  . methocarbamol (ROBAXIN)  IV 500 mg (07/15/16 0019)     LOS: 6 days   Time spent: 25 minutes   Faye Ramsay, MD [facility] Pager [phone]  If 7PM-7AM, please contact night-coverage [email or web address] Password [email or web address] 07/20/2016, 3:07 PM

## 2016-07-20 NOTE — Progress Notes (Signed)
[facility] Therapy Treatment Record Phone [phone]   Radiation Therapy was administered to Heather Hinton on: 07/20/2016  2:32 PM and was treatment # 4out of a planned course of 10 treatments.  Radiation Treatment  1). Beam photons with 6-10 energy      2). Brachytherapy None                    3). Stereotactic Radiosurgery None  4). Other Radiation None     Heather Hinton, RT (T)

## 2016-07-21 LAB — CBC
HCT: 35.1 % — ABNORMAL LOW (ref 36.0–46.0)
Hemoglobin: 10.8 g/dL — ABNORMAL LOW (ref 12.0–15.0)
MCH: 24.9 pg — ABNORMAL LOW (ref 26.0–34.0)
MCHC: 30.8 g/dL (ref 30.0–36.0)
MCV: 81.1 fL (ref 78.0–100.0)
PLATELETS: 635 10*3/uL — AB (ref 150–400)
RBC: 4.33 MIL/uL (ref 3.87–5.11)
RDW: 17.3 % — ABNORMAL HIGH (ref 11.5–15.5)
WBC: 41.9 10*3/uL — AB (ref 4.0–10.5)

## 2016-07-21 LAB — BASIC METABOLIC PANEL
ANION GAP: 9 (ref 5–15)
BUN: 21 mg/dL — ABNORMAL HIGH (ref 6–20)
CALCIUM: 9.3 mg/dL (ref 8.9–10.3)
CO2: 24 mmol/L (ref 22–32)
Chloride: 107 mmol/L (ref 101–111)
Creatinine, Ser: 0.73 mg/dL (ref 0.44–1.00)
GLUCOSE: 133 mg/dL — AB (ref 65–99)
Potassium: 5 mmol/L (ref 3.5–5.1)
Sodium: 140 mmol/L (ref 135–145)

## 2016-07-21 NOTE — Progress Notes (Signed)
Patient ID: Heather Hinton, female   DOB: 1960/12/08, 56 y.o.   MRN: [id number]    PROGRESS NOTE    Heather Hinton  VOZ:[id number] DOB: Feb 07, 1961 DOA: 07/14/2016  PCP: Neale Burly, MD    Brief Narrative:  Patient is 56 year old female with known hypertension and recent history of spinal stenosis, presented with weakness, left hip pain, fevers and chills. Initial imaging studies notable for probable primary lung malignancy with metastatic disease in the ischial tuberosity on the left with significant tumor burden as well as metastatic disease and brain. Radiation oncology consulted, recommendation was to wait for pathology results to be back so that we can determine how to move forward with radiation. Patient may be candidate for stereotactic radiation as opposed to whole brain radiation. Patient is now status post biopsy of the left ischial mass, done on 07/16/2016 by interventional radiologist. Palliative care team also consulted for assistance on pain management.  Assessment and plan:  Lung mass, significant bone lesion, worrisome for metastatic lung cancer until proven otherwise - Additional imaging studies including MRI, also notable for cerebral metastases - Radiation oncology already consulted, further recommendations pending biopsy results - Oncologist also following, assistance appreciated, outpatient appointment scheduled on June 18th, 2018 - Palliative care team also following, assistance appreciated - Continue dexamethasone 4 mg 3 times daily, plan to change to PO in am as pt says her stomach is upset today and she is not sure if she can tale prednisone PO   Postobstructive pneumonia, right middle lobe - Initially started on IV antibiotics Zosyn on admissionand was successfully transitioned to Augmentin on 07/17/2016, will continue same regimen for now  - Remains afebrile and with stable heart rate and oxygen saturations - WBC still significantly elevated but overall trending  down, would like to see more stable numbers prior to discharge   Severe cancer pain - Started on MS Contin 15 mg twice a day by mouth - continue analgesia as needed   Severe protein calorie malnutrition, malignant cachexia - continue nutritional supplements, no further changes in regimen needed   Thrombocytosis - Suspect this is reactive from malignant process - worse this AM, will have to repeat again in AM  Severe microcytic anemia - Iron studies confirm evidence of iron deficiency as well as anemia of chronic disease - This is most likely secondary to malignancy as well as chronic blood loss, malabsorption syndrome - Patient has received one dose of IV iron 07/17/2016 per oncologist recommendation - hg stable, no evidence of active bleeding    Essential hypertension - stable on Norvasc   Hyperlipidemia - Continue statin  History of smoking tobacco - Over 40 years, already counseled on cessation   DVT prophylaxis: Lovenox Code Status: Full Family Communication: pt at bedside  Disposition Plan: discuss with CM and SW, can likely be discharged in AM  Consultants:   Oncology  Radiation oncology  Palliative care  Procedures:   Left ischial bone biopsy on 07/16/2016  Antimicrobials:   Zosyn 07/15/2016 --> 07/17/2016  Augmentin 07/17/2016 -->  Subjective: Pt reports stomach upset, somewhat down this AM.   Objective: Vitals:   07/20/16 0605 07/20/16 1543 07/20/16 2041 07/21/16 0520  BP: 130/87 129/77 (!) 135/93 118/88  Pulse: 88 85 96 84  Resp: 16 18 16 18   Temp: 98.4 F (36.9 C) 98 F (36.7 C) 98 F (36.7 C) 98 F (36.7 C)  TempSrc: Oral Oral Oral Oral  SpO2: 99% 99% 100% 100%  Weight:  Height:        Intake/Output Summary (Last 24 hours) at 07/21/16 1316 Last data filed at 07/21/16 0903  Gross per 24 hour  Intake             1140 ml  Output             1100 ml  Net               40 ml   Filed Weights   07/14/16 1553  Weight: 69.4 kg  (153 lb)   Physical Exam  Constitutional: Appears well-developed and well-nourished. No distress.  CVS: RRR, S1/S2 +, no murmurs, no gallops, no carotid bruit.  Pulmonary: Effort and breath sounds normal, no stridor, rhonchi, wheezes, rales.  Abdominal: Soft. BS +,  no distension, tenderness is mild and in epigastric area  Neuro: Alert. Normal reflexes, muscle tone coordination. No cranial nerve deficit. Skin: Skin is warm and dry. No rash noted. Not diaphoretic. No erythema. No pallor.  Psychiatric: Normal mood and affect. Behavior, judgment, thought content normal.   Data Reviewed: I have personally reviewed following labs and imaging studies  CBC:  Recent Labs Lab 07/14/16 1612  07/16/16 0440 07/17/16 0352 07/18/16 0415 07/19/16 0358 07/20/16 0402 07/21/16 0519  WBC 22.4*  < > 31.1* 32.8* 38.3* 40.1* 49.0* 41.9*  NEUTROABS 17.7*  --  28.0*  --   --   --   --   --   HGB 7.9*  < > 10.4* 10.2* 10.3* 10.6* 10.9* 10.8*  HCT 25.9*  < > 32.0* 32.1* 33.1* 34.5* 34.9* 35.1*  MCV 79.0  < > 78.4 78.9 80.1 80.0 81.2 81.1  PLT 743*  < > 632* 624* 635* 632* 617* 635*  < > = values in this interval not displayed. Basic Metabolic Panel:  Recent Labs Lab 07/14/16 1612  07/17/16 0352 07/18/16 0415 07/19/16 0358 07/20/16 0402 07/21/16 0519  NA 142  < > 139 139 137 137 140  K 2.5*  < > 4.1 4.6 4.6 4.7 5.0  CL 104  < > 106 110 107 107 107  CO2 25  < > 23 23 22  21* 24  GLUCOSE 115*  < > 133* 130* 123* 127* 133*  BUN 12  < > 19 15 15  22* 21*  CREATININE 0.82  < > 0.89 0.65 0.72 0.66 0.73  CALCIUM 9.1  < > 8.5* 8.7* 9.1 9.0 9.3  MG 2.1  --   --   --   --   --   --   < > = values in this interval not displayed. Liver Function Tests:  Recent Labs Lab 07/14/16 1612 07/16/16 0440  AST 14* 10*  ALT 11* 11*  ALKPHOS 174* 138*  BILITOT 0.6 0.6  PROT 8.1 7.1  ALBUMIN 2.5* 2.3*   Coagulation Profile:  Recent Labs Lab 07/16/16 0440  INR 1.19   Urine analysis:    Component  Value Date/Time   COLORURINE STRAW (A) 07/14/2016 1856   APPEARANCEUR CLEAR 07/14/2016 1856   LABSPEC 1.020 07/14/2016 1856   PHURINE 7.0 07/14/2016 1856   GLUCOSEU NEGATIVE 07/14/2016 1856   HGBUR NEGATIVE 07/14/2016 1856   BILIRUBINUR NEGATIVE 07/14/2016 1856   KETONESUR NEGATIVE 07/14/2016 1856   PROTEINUR NEGATIVE 07/14/2016 1856   NITRITE NEGATIVE 07/14/2016 1856   LEUKOCYTESUR NEGATIVE 07/14/2016 1856   Radiology Studies: No results found. Scheduled Meds: . amLODipine  10 mg Oral QHS  . amoxicillin-clavulanate  1 tablet Oral Q12H  . dexamethasone  4 mg Intravenous TID  . enoxaparin (LOVENOX) injection  40 mg Subcutaneous Q24H  . feeding supplement  1 Container Oral TID BM  . gabapentin  300 mg Oral BID  . morphine  15 mg Oral Q12H  . polyethylene glycol  17 g Oral Daily  . pravastatin  20 mg Oral q1800  . senna-docusate  2 tablet Oral BID   Continuous Infusions: . sodium chloride    . 0.9 % NaCl with KCl 40 mEq / L 75 mL/hr (07/21/16 0333)  . methocarbamol (ROBAXIN)  IV 500 mg (07/15/16 0019)     LOS: 7 days   Time spent: 25 minutes   Faye Ramsay, MD [facility] Pager [phone]  If 7PM-7AM, please contact night-coverage [email or web address] Password [email or web address] 07/21/2016, 1:16 PM

## 2016-07-21 NOTE — Progress Notes (Signed)
Daily Progress Note   Patient Name: Heather Hinton       Date: 07/21/2016 DOB: [birth year]  Age: 56 y.o. MRN#: [id number] Attending Physician: Theodis Blaze, MD Primary Care Physician: Neale Burly, MD Admit Date: 07/14/2016  Reason for Consultation/Follow-up: Pain control  Life limiting illness: Diffuse metastatic cancer, suspect primary lung cancer with metastatic cancer to the bone.  Subjective: Pain is well controlled overall.  Some periods of breakthrough pain, but it resolves with oral rescue medications.  She remains in good spirits and denies other needs today.   See below  Length of Stay: 7  Current Medications: Scheduled Meds:  . amLODipine  10 mg Oral QHS  . amoxicillin-clavulanate  1 tablet Oral Q12H  . dexamethasone  4 mg Intravenous TID  . enoxaparin (LOVENOX) injection  40 mg Subcutaneous Q24H  . feeding supplement  1 Container Oral TID BM  . gabapentin  300 mg Oral BID  . morphine  15 mg Oral Q12H  . polyethylene glycol  17 g Oral Daily  . pravastatin  20 mg Oral q1800  . senna-docusate  2 tablet Oral BID    Continuous Infusions: . sodium chloride    . 0.9 % NaCl with KCl 40 mEq / L 75 mL/hr (07/21/16 0333)  . methocarbamol (ROBAXIN)  IV 500 mg (07/15/16 0019)    PRN Meds: acetaminophen **OR** acetaminophen, HYDROcodone-acetaminophen, methocarbamol (ROBAXIN)  IV, morphine injection, ondansetron **OR** ondansetron (ZOFRAN) IV  Physical Exam         Sleepy but arouses easily. resting in bed Lying with L hip up S1 S2 Clear Abdomen soft No edema Non focal  Vital Signs: BP 118/88 (BP Location: Left Arm)   Pulse 84   Temp 98 F (36.7 C) (Oral)   Resp 18   Ht 5\' 7"  (1.702 m)   Wt 69.4 kg (153 lb)   SpO2 100%   BMI 23.96 kg/m  SpO2: SpO2: 100 % O2  Device: O2 Device: Not Delivered O2 Flow Rate: O2 Flow Rate (L/min): 2 L/min  Intake/output summary:   Intake/Output Summary (Last 24 hours) at 07/21/16 1005 Last data filed at 07/21/16 0903  Gross per 24 hour  Intake             1140 ml  Output  1300 ml  Net             -160 ml   LBM: Last BM Date: 07/19/16 Baseline Weight: Weight: 69.4 kg (153 lb) Most recent weight: Weight: 69.4 kg (153 lb)       Palliative Assessment/Data:    Flowsheet Rows     Most Recent Value  Intake Tab  Referral Department  Hospitalist  Unit at Time of Referral  Oncology Unit  Palliative Care Primary Diagnosis  Cancer  Date Notified  07/15/16  Palliative Care Type  New Palliative care  Reason for referral  Pain  Date of Admission  07/14/16  Date first seen by Palliative Care  07/15/16  # of days Palliative referral response time  0 Day(s)  # of days IP prior to Palliative referral  1  Clinical Assessment  Palliative Performance Scale Score  50%  Pain Max last 24 hours  10  Pain Min Last 24 hours  6  Dyspnea Max Last 24 Hours  0  Dyspnea Min Last 24 hours  0  Nausea Max Last 24 Hours  2  Nausea Min Last 24 Hours  0  Anxiety Max Last 24 Hours  0  Anxiety Min Last 24 Hours  0  Other Max Last 24 Hours  0  Psychosocial & Spiritual Assessment  Palliative Care Outcomes  Patient/Family meeting held?  Yes  Who was at the meeting?  pt and multiple family members  Palliative Care Outcomes  Improved pain interventions  Palliative Care follow-up planned  Yes, Facility      Patient Active Problem List   Diagnosis Date Noted  . Iron deficiency anemia   . Metastasis to brain ([place])   . 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 Metastasis to bone ([place])   . Left hip pain   . Palliative care by specialist   . Metastatic lung cancer (metastasis from lung to other site) ([place]) 07/14/2016  . Essential hypertension 07/14/2016  . Hypokalemia 07/14/2016  . Hyperglycemia 07/14/2016  . Symptomatic  anemia 07/14/2016  . Thrombocytosis ([place]) 07/14/2016    Palliative Care Assessment & Plan   Patient Profile:    Assessment:  Diffuse metastatic cancer, suspect primary lung cancer with metastatic cancer to the bone Uncontrolled pain  Recommendations/Plan:  Pain well controlled on current regimen.  She reports some periods of increased pain, but it improved after taking her oral rescue medication. Discussed use of pain medication prior to activities that cause exacerbation of pain, such as lying on table for radiation.   I would recommend planning to discharge her on current regimen of MS Contin and Norco.  She is currently taking around 3 rescue doses daily, which is approximately 30 oral morphine equivalents (30mg  hydrocodone) via short acting and 30 oral morphine equivalents (30mg  MS Contin) via long acting daily.  If she begins taking more that three rescue doses daily on a regular basis, would consider up-titration of her long acting medication (MS Contin).   Continue anti emetic regimen  Constipation: Resolved per patient report. Opioid related.  Continue senna. Continue miralax.   Continue to support the patient holistically.   Goals of Care and Additional Recommendations:  Limitations on Scope of Treatment: Full Scope Treatment   We discussed today regarding advance care planning, including HCPOA and code status.  Her husband is her surrogate Media planner.  She will need to discuss further with her family regarding her goals of care, including code status.  She is invested in plan to  complete radiation and see Dr. Alvy Bimler in follow-up prior to making any further changes to her care plan.  Code Status:    Code Status Orders        Start     Ordered   07/14/16 2325  Full code  Continuous     07/14/16 2324    Code Status History    Date Active Date Inactive Code Status Order ID Comments User Context   This patient has a current code status but no historical code  status.       Prognosis:   Unable to determine  Discharge Planning:  Patient reports plan for home on discharge  Care plan was discussed with patient, RN    Thank you for allowing the Palliative Medicine Team to assist in the care of this patient.   Time In: 0945 Time Out: 1005 Total Time 20 Prolonged Time Billed  no       Greater than 50%  of this time was spent counseling and coordinating care related to the above assessment and plan.  Micheline Rough, MD [phone]  Please contact Palliative Medicine Team phone at [phone] for questions and concerns.

## 2016-07-22 LAB — CBC
HCT: 35.3 % — ABNORMAL LOW (ref 36.0–46.0)
Hemoglobin: 11.2 g/dL — ABNORMAL LOW (ref 12.0–15.0)
MCH: 25.6 pg — ABNORMAL LOW (ref 26.0–34.0)
MCHC: 31.7 g/dL (ref 30.0–36.0)
MCV: 80.6 fL (ref 78.0–100.0)
PLATELETS: 606 10*3/uL — AB (ref 150–400)
RBC: 4.38 MIL/uL (ref 3.87–5.11)
RDW: 17.7 % — AB (ref 11.5–15.5)
WBC: 37.6 10*3/uL — AB (ref 4.0–10.5)

## 2016-07-22 LAB — BASIC METABOLIC PANEL
ANION GAP: 10 (ref 5–15)
BUN: 20 mg/dL (ref 6–20)
CALCIUM: 9.2 mg/dL (ref 8.9–10.3)
CO2: 24 mmol/L (ref 22–32)
Chloride: 104 mmol/L (ref 101–111)
Creatinine, Ser: 0.67 mg/dL (ref 0.44–1.00)
GLUCOSE: 131 mg/dL — AB (ref 65–99)
POTASSIUM: 4.5 mmol/L (ref 3.5–5.1)
SODIUM: 138 mmol/L (ref 135–145)

## 2016-07-22 MED ORDER — DEXAMETHASONE 4 MG PO TABS
4.0000 mg | ORAL_TABLET | Freq: Three times a day (TID) | ORAL | Status: DC
  Administered 2016-07-22 – 2016-07-25 (×10): 4 mg via ORAL
  Filled 2016-07-22 (×10): qty 1

## 2016-07-22 NOTE — Progress Notes (Addendum)
Patient ID: Heather Hinton, female   DOB: 1960/10/02, 56 y.o.   MRN: [id number]    PROGRESS NOTE    Heather Hinton  CBJ:[id number] DOB: [birth year] DOA: 07/14/2016  PCP: Neale Burly, MD    Brief Narrative:  Patient is 56 year old female with known hypertension and recent history of spinal stenosis, presented with weakness, left hip pain, fevers and chills. Initial imaging studies notable for probable primary lung malignancy with metastatic disease in the ischial tuberosity on the left with significant tumor burden as well as metastatic disease and brain. Radiation oncology consulted, recommendation was to wait for pathology results to be back so that we can determine how to move forward with radiation. Patient may be candidate for stereotactic radiation as opposed to whole brain radiation. Patient is now status post biopsy of the left ischial mass, done on 07/16/2016 by interventional radiologist. Palliative care team also consulted for assistance on pain management.  Assessment and plan:  Lung mass, significant bone lesion, worrisome for metastatic lung cancer until proven otherwise - Additional imaging studies including MRI, also notable for cerebral metastases - Radiation oncology already consulted, further recommendations pending biopsy results - Oncologist also following, assistance appreciated, outpatient appointment scheduled on June 18th, 2018 - Palliative care team also following, assistance appreciated - Continue dexamethasone 4 mg 3 times daily, plan to change to PO today to see how pt tolerate   Postobstructive pneumonia, right middle lobe - Initially started on IV antibiotics Zosyn on admissionand was successfully transitioned to Augmentin on 07/17/2016, can stop Augmentin today  - Remains afebrile and with stable heart rate and oxygen saturations - WBC still up but trending down so far   Severe cancer pain - Started on MS Contin 15 mg twice a day by mouth - pain  controlled for now  Severe protein calorie malnutrition, malignant cachexia - continue nutritional supplements, no further changes in regimen needed  - pt still on IVF ans suspect we can stop in am if no further nausea and oral intake stable   Thrombocytosis - Suspect this is reactive from malignant process - improving overall   Severe microcytic anemia - Iron studies confirm evidence of iron deficiency as well as anemia of chronic disease - This is most likely secondary to malignancy as well as chronic blood loss, malabsorption syndrome - Patient has received one dose of IV iron 07/17/2016 per oncologist recommendation - Hg remains stable with no evidence of active bleeding    Essential hypertension - reasonable inpatient control   Hyperlipidemia - Continue statin  History of smoking tobacco - Over 40 years, already counseled on cessation   DVT prophylaxis: Lovenox Code Status: Full Family Communication: pt at bedside  Disposition Plan: discuss with CM and SW, can likely be discharged in AM if tolerating PO Dexamethasone   Consultants:   Oncology  Radiation oncology  Palliative care  Procedures:   Left ischial bone biopsy on 07/16/2016  Antimicrobials:   Zosyn 07/15/2016 --> 07/17/2016  Augmentin 07/17/2016 --> 07/22/2016  Subjective: Pt denies chest or abd pain, no dyspnea, has persistent left hip area pain.   Objective: Vitals:   07/21/16 0520 07/21/16 1429 07/21/16 2008 07/22/16 0538  BP: 118/88 139/81 (!) 141/80 (!) 144/90  Pulse: 84 100 67 96  Resp: 18 20 18 18   Temp: 98 F (36.7 C) 98.2 F (36.8 C) 98.5 F (36.9 C) 98.2 F (36.8 C)  TempSrc: Oral Oral Oral Oral  SpO2: 100% 100% 100% 98%  Weight:  Height:        Intake/Output Summary (Last 24 hours) at 07/22/16 1258 Last data filed at 07/22/16 0945  Gross per 24 hour  Intake           1167.5 ml  Output                0 ml  Net           1167.5 ml   Filed Weights   07/14/16 1553    Weight: 69.4 kg (153 lb)   Physical Exam  Constitutional: Appears well-developed and well-nourished. No distress.  CVS: RRR, S1/S2 +, no murmurs, no gallops, no carotid bruit.  Pulmonary: Effort and breath sounds normal, no stridor, rhonchi, wheezes, rales.  Abdominal: Soft. BS +,  no distension, tenderness, rebound or guarding.  Neuro: Alert. Normal reflexes, muscle tone coordination. No cranial nerve deficit. Skin: Skin is warm and dry. No rash noted. Not diaphoretic. No erythema. No pallor.  Psychiatric: Normal mood and affect. Behavior, judgment, thought content normal.   Data Reviewed: I have personally reviewed following labs and imaging studies  CBC:  Recent Labs Lab 07/16/16 0440  07/18/16 0415 07/19/16 0358 07/20/16 0402 07/21/16 0519 07/22/16 0423  WBC 31.1*  < > 38.3* 40.1* 49.0* 41.9* 37.6*  NEUTROABS 28.0*  --   --   --   --   --   --   HGB 10.4*  < > 10.3* 10.6* 10.9* 10.8* 11.2*  HCT 32.0*  < > 33.1* 34.5* 34.9* 35.1* 35.3*  MCV 78.4  < > 80.1 80.0 81.2 81.1 80.6  PLT 632*  < > 635* 632* 617* 635* 606*  < > = values in this interval not displayed. Basic Metabolic Panel:  Recent Labs Lab 07/18/16 0415 07/19/16 0358 07/20/16 0402 07/21/16 0519 07/22/16 0423  NA 139 137 137 140 138  K 4.6 4.6 4.7 5.0 4.5  CL 110 107 107 107 104  CO2 23 22 21* 24 24  GLUCOSE 130* 123* 127* 133* 131*  BUN 15 15 22* 21* 20  CREATININE 0.65 0.72 0.66 0.73 0.67  CALCIUM 8.7* 9.1 9.0 9.3 9.2   Liver Function Tests:  Recent Labs Lab 07/16/16 0440  AST 10*  ALT 11*  ALKPHOS 138*  BILITOT 0.6  PROT 7.1  ALBUMIN 2.3*   Coagulation Profile:  Recent Labs Lab 07/16/16 0440  INR 1.19   Urine analysis:    Component Value Date/Time   COLORURINE STRAW (A) 07/14/2016 1856   APPEARANCEUR CLEAR 07/14/2016 1856   LABSPEC 1.020 07/14/2016 1856   PHURINE 7.0 07/14/2016 1856   GLUCOSEU NEGATIVE 07/14/2016 1856   HGBUR NEGATIVE 07/14/2016 1856   BILIRUBINUR NEGATIVE  07/14/2016 1856   KETONESUR NEGATIVE 07/14/2016 1856   PROTEINUR NEGATIVE 07/14/2016 1856   NITRITE NEGATIVE 07/14/2016 1856   LEUKOCYTESUR NEGATIVE 07/14/2016 1856   Radiology Studies: No results found. Scheduled Meds: . amLODipine  10 mg Oral QHS  . amoxicillin-clavulanate  1 tablet Oral Q12H  . dexamethasone  4 mg Intravenous TID  . enoxaparin (LOVENOX) injection  40 mg Subcutaneous Q24H  . feeding supplement  1 Container Oral TID BM  . gabapentin  300 mg Oral BID  . morphine  15 mg Oral Q12H  . polyethylene glycol  17 g Oral Daily  . pravastatin  20 mg Oral q1800  . senna-docusate  2 tablet Oral BID   Continuous Infusions: . sodium chloride    . 0.9 % NaCl with KCl 40 mEq / L  75 mL/hr (07/22/16 0816)  . methocarbamol (ROBAXIN)  IV 500 mg (07/15/16 0019)     LOS: 8 days   Time spent: 25 minutes   Faye Ramsay, MD [facility] Pager [phone]  If 7PM-7AM, please contact night-coverage [email or web address] Password [email or web address] 07/22/2016, 12:58 PM

## 2016-07-23 ENCOUNTER — Ambulatory Visit
Admit: 2016-07-23 | Discharge: 2016-07-23 | Disposition: A | Discharge status: Home / Self Care | Payer: Medicaid Other | Attending: Radiation Oncology | Admitting: Radiation Oncology

## 2016-07-23 DIAGNOSIS — Z510 Encounter for antineoplastic radiation therapy: Secondary | ICD-10-CM | POA: Diagnosis not present

## 2016-07-23 LAB — CBC
HEMATOCRIT: 35.4 % — AB (ref 36.0–46.0)
HEMOGLOBIN: 11.2 g/dL — AB (ref 12.0–15.0)
MCH: 25.5 pg — AB (ref 26.0–34.0)
MCHC: 31.6 g/dL (ref 30.0–36.0)
MCV: 80.5 fL (ref 78.0–100.0)
Platelets: 602 10*3/uL — ABNORMAL HIGH (ref 150–400)
RBC: 4.4 MIL/uL (ref 3.87–5.11)
RDW: 17.8 % — ABNORMAL HIGH (ref 11.5–15.5)
WBC: 35.7 10*3/uL — ABNORMAL HIGH (ref 4.0–10.5)

## 2016-07-23 LAB — BASIC METABOLIC PANEL
Anion gap: 9 (ref 5–15)
BUN: 21 mg/dL — AB (ref 6–20)
CHLORIDE: 106 mmol/L (ref 101–111)
CO2: 22 mmol/L (ref 22–32)
CREATININE: 0.56 mg/dL (ref 0.44–1.00)
Calcium: 9.2 mg/dL (ref 8.9–10.3)
GFR calc Af Amer: >60 mL/min (ref >60)
GFR calc non Af Amer: >60 mL/min (ref >60)
GLUCOSE: 130 mg/dL — AB (ref 65–99)
POTASSIUM: 4.6 mmol/L (ref 3.5–5.1)
SODIUM: 137 mmol/L (ref 135–145)

## 2016-07-23 MED ORDER — POLYETHYLENE GLYCOL 3350 17 G PO PACK: 17.0000 g | PACK | Freq: Every day | ORAL | 0 refills | Status: AC

## 2016-07-23 MED ORDER — DEXAMETHASONE 4 MG PO TABS: 4.0000 mg | ORAL_TABLET | Freq: Three times a day (TID) | ORAL | 0 refills | Status: DC

## 2016-07-23 MED ORDER — SENNOSIDES-DOCUSATE SODIUM 8.6-50 MG PO TABS: 2.0000 | ORAL_TABLET | Freq: Two times a day (BID) | ORAL | Status: DC

## 2016-07-23 MED ORDER — HYDROCODONE-ACETAMINOPHEN 5-325 MG PO TABS: 1.0000 | ORAL_TABLET | ORAL | 0 refills | Status: DC | PRN

## 2016-07-23 MED ORDER — MORPHINE SULFATE ER 15 MG PO TBCR: 15.0000 mg | EXTENDED_RELEASE_TABLET | Freq: Two times a day (BID) | ORAL | 0 refills | Status: DC

## 2016-07-23 NOTE — Discharge Instructions (Signed)
Hip Pain The hip is the joint between the upper legs and the lower pelvis. The bones, cartilage, tendons, and muscles of your hip joint support your body and allow you to move around. Hip pain can range from a minor ache to severe pain in one or both of your hips. The pain may be felt on the inside of the hip joint near the groin, or the outside near the buttocks and upper thigh. You may also have swelling or stiffness. Follow these instructions at home: Managing pain, stiffness, and swelling   If directed, apply ice to the injured area.  Put ice in a plastic bag.  Place a towel between your skin and the bag.  Leave the ice on for 20 minutes, 2-3 times a day  Sleep with a pillow between your legs on your most comfortable side.  Avoid any activities that cause pain. General instructions   Take over-the-counter and prescription medicines only as told by your health care provider.  Do any exercises as told by your health care provider.  Record the following:  How often you have hip pain.  The location of your pain.  What the pain feels like.  What makes the pain worse.  Keep all follow-up visits as told by your health care provider. This is important. Contact a health care provider if:  You cannot put weight on your leg.  Your pain or swelling continues or gets worse after one week.  It gets harder to walk.  You have a fever. Get help right away if:  You fall.  You have a sudden increase in pain and swelling in your hip.  Your hip is red or swollen or very tender to touch. Summary  Hip pain can range from a minor ache to severe pain in one or both of your hips.  The pain may be felt on the inside of the hip joint near the groin, or the outside near the buttocks and upper thigh.  Avoid any activities that cause pain.  Record how often you have hip pain, the location of the pain, what makes it worse and what it feels like. This information is not intended to  replace advice given to you by your health care provider. Make sure you discuss any questions you have with your health care provider. Document Released: 07/19/2009 Document Revised: 01/02/2016 Document Reviewed: 01/02/2016 Elsevier Interactive Patient Education  2017 [employer] Inc.  

## 2016-07-23 NOTE — Progress Notes (Signed)
Husband would like for MD to contact him before any discharge decisions are made. Please contact him @ [phone].

## 2016-07-23 NOTE — NC FL2 (Signed)
[place] LEVEL OF CARE SCREENING TOOL     IDENTIFICATION  Patient Name: Heather Hinton Birthdate: 04-15-1960 Sex: female Admission Date (Current Location): 07/14/2016  [facility] [place] and [place] Number:  Herbalist and Address:  [facility],  [address], [place]      Provider Number: [id number]  Attending Physician Name and Address:  Theodis Blaze, MD  Relative Name and Phone Number:       Current Level of Care: Hospital Recommended Level of Care: [place] Prior Approval Number:    Date Approved/Denied:   PASRR Number: [id number]  Discharge Plan: SNF    Current Diagnoses: Patient Active Problem List   Diagnosis Date Noted  . Iron deficiency anemia   . Metastasis to brain ([place])   . 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 Metastasis to bone ([place])   . Left hip pain   . Palliative care by specialist   . Metastatic lung cancer (metastasis from lung to other site) ([place]) 07/14/2016  . Essential hypertension 07/14/2016  . Hypokalemia 07/14/2016  . Hyperglycemia 07/14/2016  . Symptomatic anemia 07/14/2016  . Thrombocytosis ([place]) 07/14/2016    Orientation RESPIRATION BLADDER Height & Weight     Self, Time, Situation, Place  Normal Continent Weight: 153 lb (69.4 kg) Height:  5\' 7"  (170.2 cm)  BEHAVIORAL SYMPTOMS/MOOD NEUROLOGICAL BOWEL NUTRITION STATUS      Continent Diet (regular, thin fluid consistency)  AMBULATORY STATUS COMMUNICATION OF NEEDS Skin   Limited Assist   Normal                       Personal Care Assistance Level of Assistance  Bathing, Feeding, Dressing Bathing Assistance: Limited assistance Feeding assistance: Independent Dressing Assistance: Independent     Functional Limitations Info  Sight, Hearing, Speech Sight Info: Adequate Hearing Info: Adequate Speech Info: Adequate    SPECIAL CARE FACTORS FREQUENCY  PT (By licensed PT), OT (By licensed OT)     PT Frequency: 5x OT  Frequency: 5x            Contractures Contractures Info: Not present    Additional Factors Info  Code Status, Allergies Code Status Info: full Allergies Info: iron           Current Medications (07/23/2016):  This is the current hospital active medication list Current Facility-Administered Medications  Medication Dose Route Frequency Provider Last Rate Last Dose  . 0.9 %  sodium chloride infusion   Intravenous Once Karmen Bongo, MD      . acetaminophen (TYLENOL) tablet 650 mg  650 mg Oral Q6H PRN Karmen Bongo, MD   650 mg at 07/15/16 2107   Or  . acetaminophen (TYLENOL) suppository 650 mg  650 mg Rectal Q6H PRN Karmen Bongo, MD      . amLODipine (NORVASC) tablet 10 mg  10 mg Oral Ivery Quale, MD   10 mg at 07/22/16 2131  . dexamethasone (DECADRON) tablet 4 mg  4 mg Oral Q8H Theodis Blaze, MD   4 mg at 07/23/16 1353  . enoxaparin (LOVENOX) injection 40 mg  40 mg Subcutaneous Q24H Caren Griffins, MD   40 mg at 07/23/16 0828  . feeding supplement (BOOST / RESOURCE BREEZE) liquid 1 Container  1 Container Oral TID BM Theodis Blaze, MD   1 Container at 07/23/16 1400  . gabapentin (NEURONTIN) capsule 300 mg  300 mg Oral BID Karmen Bongo, MD   300 mg  at 07/23/16 0827  . HYDROcodone-acetaminophen (NORCO/VICODIN) 5-325 MG per tablet 1-2 tablet  1-2 tablet Oral Q4H PRN Micheline Rough, MD   2 tablet at 07/23/16 0557  . methocarbamol (ROBAXIN) 500 mg in dextrose 5 % 50 mL IVPB  500 mg Intravenous Q6H PRN Karmen Bongo, MD 110 mL/hr at 07/15/16 0019 500 mg at 07/15/16 0019  . morphine (MS CONTIN) 12 hr tablet 15 mg  15 mg Oral Q12H Alvy Bimler, Ni, MD   15 mg at 07/23/16 0828  . morphine 2 MG/ML injection 2 mg  2 mg Intravenous Q2H PRN Micheline Rough, MD   2 mg at 07/20/16 2333  . ondansetron (ZOFRAN) tablet 4 mg  4 mg Oral Q6H PRN Karmen Bongo, MD   4 mg at 07/15/16 0453   Or  . ondansetron [facility] [place]) injection 4 mg  4 mg Intravenous Q6H PRN Karmen Bongo, MD      .  polyethylene glycol (MIRALAX / GLYCOLAX) packet 17 g  17 g Oral Daily Bullard, Elray Mcgregor, NP   17 g at 07/18/16 1040  . pravastatin (PRAVACHOL) tablet 20 mg  20 mg Oral q1800 Karmen Bongo, MD   20 mg at 07/22/16 1751  . senna-docusate (Senokot-S) tablet 2 tablet  2 tablet Oral BID Heath Lark, MD   2 tablet at 07/23/16 9794     Discharge Medications: Please see discharge summary for a list of discharge medications.  Relevant Imaging Results:  Relevant Lab Results:   Additional Information SS# [id number]. pt has daily radiation appts  Nila Nephew, LCSW

## 2016-07-23 NOTE — Progress Notes (Signed)
Spoke with attending MD, SNF recommended for pt due to need for 24/7 supervision. Pt had previously declined placement, however today CSW discussed again and pt willing to pursue SNF.  Obtained Passr and completed FL2, referred to area SNFs. Discussed with pt need for facility to provide transport to radiation appointments and for medicaid bed availability. Pt understanding.  Will follow to assist with transfer to SNF at DC.  Sharren Bridge, MSW, LCSW Clinical Social Work 07/23/2016 [phone]

## 2016-07-23 NOTE — Progress Notes (Addendum)
Patient ID: Heather Hinton, female   DOB: 26-Nov-1960, 56 y.o.   MRN: [id number]    PROGRESS NOTE  Heather Hinton  SWN:[id number] DOB: Jun 18, 1960 DOA: 07/14/2016  PCP: Neale Burly, MD    Brief Narrative:  Patient is 56 year old female with known hypertension and recent history of spinal stenosis, presented with weakness, left hip pain, fevers and chills. Initial imaging studies notable for probable primary lung malignancy with metastatic disease in the ischial tuberosity on the left with significant tumor burden as well as metastatic disease and brain. Radiation oncology consulted, recommendation was to wait for pathology results to be back so that we can determine how to move forward with radiation. Patient may be candidate for stereotactic radiation as opposed to whole brain radiation. Patient is now status post biopsy of the left ischial mass, done on 07/16/2016 by interventional radiologist. Palliative care team also consulted for assistance on pain management.  Assessment and plan:  Lung mass, significant bone lesion, worrisome for metastatic lung cancer until proven otherwise - Additional imaging studies including MRI, also notable for cerebral metastases - Radiation oncology already consulted, further recommendations pending biopsy results - Oncologist also following, assistance appreciated, outpatient appointment scheduled on June 18th, 2018 - Palliative care team also following, assistance appreciated - Continue dexamethasone 4 mg 3 times daily - pt tolerating PO well  Postobstructive pneumonia, right middle lobe - Initially started on IV antibiotics Zosyn on admissionand was successfully transitioned to Augmentin on 07/17/2016, can stop Augmentin today  - remains afebrile, WBC is trending down  Severe cancer pain - Started on MS Contin 15 mg twice a day by mouth - pain controlled   Severe protein calorie malnutrition, malignant cachexia - continue nutritional supplements, no  further changes in regimen needed  - still on IVF - plan on d/c IVF as pt tolerating PO well  Thrombocytosis - Suspect this is reactive from malignant process - improving overall   Severe microcytic anemia - Iron studies confirm evidence of iron deficiency as well as anemia of chronic disease - This is most likely secondary to malignancy as well as chronic blood loss, malabsorption syndrome - Patient has received one dose of IV iron 07/17/2016 per oncologist recommendation - Hg remains stable with no evidence of active bleeding    Essential hypertension - reasonable inpatient control   Hyperlipidemia - Continue statin  History of smoking tobacco - Over 40 years, already counseled on cessation   DVT prophylaxis: Lovenox Code Status: Full Family Communication: pt at bedside  Disposition Plan: discuss with CM and SW, can likely be discharged in AM, will ask PT to eval again to see if pt would be OK to go to SNF  Consultants:   Oncology  Radiation oncology  Palliative care  Procedures:   Left ischial bone biopsy on 07/16/2016  Antimicrobials:   Zosyn 07/15/2016 --> 07/17/2016  Augmentin 07/17/2016 --> 07/22/2016  Subjective: Pt with no chest pain or dyspnea this AM.  Objective: Vitals:   07/22/16 0538 07/22/16 1439 07/22/16 2000 07/23/16 0400  BP: (!) 144/90 123/71 137/74 132/83  Pulse: 96 95 95 80  Resp: 18 16 16 14   Temp: 98.2 F (36.8 C) 98.9 F (37.2 C) 98.5 F (36.9 C) 98.4 F (36.9 C)  TempSrc: Oral Oral Oral Oral  SpO2: 98% 99% 100% 99%  Weight:      Height:        Intake/Output Summary (Last 24 hours) at 07/23/16 1336 Last data filed at 07/23/16 0939  Gross per  24 hour  Intake              480 ml  Output                0 ml  Net              480 ml   Filed Weights   07/14/16 1553  Weight: 69.4 kg (153 lb)   Physical Exam  Constitutional: Appears well-developed and well-nourished. No distress.  CVS: RRR, S1/S2 +, no murmurs, no  gallops, no carotid bruit.  Pulmonary: Effort and breath sounds normal, no stridor, rhonchi, wheezes, rales.  Abdominal: Soft. BS +,  no distension, tenderness, rebound or guarding.   Data Reviewed: I have personally reviewed following labs and imaging studies  CBC:  Recent Labs Lab 07/19/16 0358 07/20/16 0402 07/21/16 0519 07/22/16 0423 07/23/16 0435  WBC 40.1* 49.0* 41.9* 37.6* 35.7*  HGB 10.6* 10.9* 10.8* 11.2* 11.2*  HCT 34.5* 34.9* 35.1* 35.3* 35.4*  MCV 80.0 81.2 81.1 80.6 80.5  PLT 632* 617* 635* 606* 599*   Basic Metabolic Panel:  Recent Labs Lab 07/19/16 0358 07/20/16 0402 07/21/16 0519 07/22/16 0423 07/23/16 0435  NA 137 137 140 138 137  K 4.6 4.7 5.0 4.5 4.6  CL 107 107 107 104 106  CO2 22 21* 24 24 22   GLUCOSE 123* 127* 133* 131* 130*  BUN 15 22* 21* 20 21*  CREATININE 0.72 0.66 0.73 0.67 0.56  CALCIUM 9.1 9.0 9.3 9.2 9.2   Urine analysis:    Component Value Date/Time   COLORURINE STRAW (A) 07/14/2016 1856   APPEARANCEUR CLEAR 07/14/2016 1856   LABSPEC 1.020 07/14/2016 1856   PHURINE 7.0 07/14/2016 1856   GLUCOSEU NEGATIVE 07/14/2016 1856   HGBUR NEGATIVE 07/14/2016 1856   BILIRUBINUR NEGATIVE 07/14/2016 1856   KETONESUR NEGATIVE 07/14/2016 1856   PROTEINUR NEGATIVE 07/14/2016 1856   NITRITE NEGATIVE 07/14/2016 1856   LEUKOCYTESUR NEGATIVE 07/14/2016 1856   Radiology Studies: No results found. Scheduled Meds: . amLODipine  10 mg Oral QHS  . amoxicillin-clavulanate  1 tablet Oral Q12H  . dexamethasone  4 mg Oral Q8H  . enoxaparin (LOVENOX) injection  40 mg Subcutaneous Q24H  . feeding supplement  1 Container Oral TID BM  . gabapentin  300 mg Oral BID  . morphine  15 mg Oral Q12H  . polyethylene glycol  17 g Oral Daily  . pravastatin  20 mg Oral q1800  . senna-docusate  2 tablet Oral BID   Continuous Infusions: . sodium chloride    . 0.9 % NaCl with KCl 40 mEq / L 75 mL/hr (07/23/16 1112)  . methocarbamol (ROBAXIN)  IV 500 mg (07/15/16  0019)     LOS: 9 days   Time spent: 15 minutes  Faye Ramsay, MD [facility] Pager [phone]  If 7PM-7AM, please contact night-coverage [email or web address] Password [email or web address] 07/23/2016, 1:36 PM

## 2016-07-24 ENCOUNTER — Ambulatory Visit
Admit: 2016-07-24 | Discharge: 2016-07-24 | Disposition: A | Discharge status: Home / Self Care | Payer: Medicaid Other | Attending: Radiation Oncology | Admitting: Radiation Oncology

## 2016-07-24 LAB — CBC
HCT: 39.2 % (ref 36.0–46.0)
Hemoglobin: 12.2 g/dL (ref 12.0–15.0)
MCH: 25.3 pg — ABNORMAL LOW (ref 26.0–34.0)
MCHC: 31.1 g/dL (ref 30.0–36.0)
MCV: 81.3 fL (ref 78.0–100.0)
PLATELETS: 584 10*3/uL — AB (ref 150–400)
RBC: 4.82 MIL/uL (ref 3.87–5.11)
RDW: 18.3 % — AB (ref 11.5–15.5)
WBC: 36.4 10*3/uL — AB (ref 4.0–10.5)

## 2016-07-24 LAB — BASIC METABOLIC PANEL
ANION GAP: 11 (ref 5–15)
BUN: 24 mg/dL — ABNORMAL HIGH (ref 6–20)
CALCIUM: 9.8 mg/dL (ref 8.9–10.3)
CO2: 26 mmol/L (ref 22–32)
CREATININE: 0.67 mg/dL (ref 0.44–1.00)
Chloride: 101 mmol/L (ref 101–111)
Glucose, Bld: 132 mg/dL — ABNORMAL HIGH (ref 65–99)
Potassium: 4.4 mmol/L (ref 3.5–5.1)
Sodium: 138 mmol/L (ref 135–145)

## 2016-07-24 NOTE — Progress Notes (Signed)
Met with pt to discuss DC plan. Explained that MCD on file is reportedly family planning MCD and will not cover SNF. Inquired of pt as to whether private pay SNF is option.  Pt states she still prefers to go home at DC but was not willing to discuss any DC planning further. States to CSW, "I am not leaving the hospital till the 18th and I can't plan until then." CSW explained regardless of DC date, DC planning is needed in order to explore options. Pt adamantly declined further discussion.  Updated attending.  Will follow.   Sharren Bridge, MSW, LCSW Clinical Social Work 07/24/2016 [phone]

## 2016-07-24 NOTE — Evaluation (Signed)
Physical Therapy Evaluation Patient Details Name: Heather Hinton MRN: [id number] DOB: May 12, 1960 Today's Date: 07/24/2016   History of Present Illness  56 yo female admitted with severe Left hip pain.  Found to have brain and Lung masses, significant bone lesion left hip, metastatic lung cancer  Clinical Impression  Patient continued to function at level appropriate for home with intermittent assist from family.  She already has RW delivered to her room and would benefit from tub bench for home use as well.  PT will continue to follow acutely as not likely to be covered after d/c.     Follow Up Recommendations Home health PT (if covered )    Equipment Recommendations  Rolling walker with 5" wheels;Other (comment) (tub bench)    Recommendations for Other Services       Precautions / Restrictions Precautions Precautions: Fall      Mobility  Bed Mobility Overal bed mobility: Independent                Transfers Overall transfer level: Modified independent Equipment used: Rolling walker (2 wheeled) Transfers: Sit to/from Stand Sit to Stand: Modified independent (Device/Increase time)            Ambulation/Gait Ambulation/Gait assistance: Supervision Ambulation Distance (Feet): 400 Feet Assistive device: Rolling walker (2 wheeled) Gait Pattern/deviations: Step-through pattern;Antalgic;Trunk flexed     General Gait Details: cues for posture twice during ambulation  Stairs Stairs: Yes Stairs assistance: Supervision;Min guard Stair Management: Two rails;Step to pattern;Forwards Number of Stairs: 3 General stair comments: assist for safety, cues for sequence  Wheelchair Mobility    Modified Rankin (Stroke Patients Only)       Balance Overall balance assessment: Needs assistance   Sitting balance-Leahy Scale: Good Sitting balance - Comments: tends to lean to R to get off sore L hip   Standing balance support: Bilateral upper extremity supported;No  upper extremity supported Standing balance-Leahy Scale: Fair Standing balance comment: can stand without UE support, but walker needed for ambulation                              Pertinent Vitals/Pain Pain Score: 4  Pain Location: L hip with mobility Pain Descriptors / Indicators: Aching;Sore Pain Intervention(s): Monitored during session    Home Living Family/patient expects to be discharged to:: Private residence Living Arrangements: Spouse/significant other Available Help at Discharge: Family Type of Home: House Home Access: Stairs to enter Entrance Stairs-Rails: Psychiatric nurse of Steps: 3 Home Layout: One level Home Equipment: Mining engineer - 2 wheels      Prior Function Level of Independence: Independent         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     Hand Dominance   Dominant Hand: Right    Extremity/Trunk Assessment   Upper Extremity Assessment Upper Extremity Assessment: Overall WFL for tasks assessed    Lower Extremity Assessment LLE Deficits / Details: AROM WFL, strength grossly 3+/5       Communication   Communication: No difficulties  Cognition Arousal/Alertness: Awake/alert Behavior During Therapy: WFL for tasks assessed/performed Overall Cognitive Status: Within Functional Limits for tasks assessed                                        General Comments      Exercises     Assessment/Plan  PT Assessment Patient needs continued PT services  PT Problem List Decreased knowledge of use of DME;Pain;Decreased mobility;Decreased safety awareness;Decreased strength       PT Treatment Interventions Gait training;DME instruction;Therapeutic exercise;Patient/family education;Therapeutic activities;Stair training;Balance training;Functional mobility training    PT Goals (Current goals can be found in the Care Plan section)  Acute Rehab PT Goals Patient Stated Goal: to go home PT Goal  Formulation: With patient Time For Goal Achievement: 07/31/16 Potential to Achieve Goals: Good    Frequency Min 3X/week   Barriers to discharge        Co-evaluation               AM-PAC PT "6 Clicks" Daily Activity  Outcome Measure Difficulty turning over in bed (including adjusting bedclothes, sheets and blankets)?: None Difficulty moving from lying on back to sitting on the side of the bed? : None Difficulty sitting down on and standing up from a chair with arms (e.g., wheelchair, bedside commode, etc,.)?: None Help needed moving to and from a bed to chair (including a wheelchair)?: None Help needed walking in hospital room?: A Little Help needed climbing 3-5 steps with a railing? : A Little 6 Click Score: 22    End of Session Equipment Utilized During Treatment: Gait belt Activity Tolerance: Patient tolerated treatment well Patient left: in bed;with call bell/phone within reach   PT Visit Diagnosis: Difficulty in walking, not elsewhere classified (R26.2);Pain Pain - Right/Left: Left Pain - part of body: Hip    Time: 0102-7253 PT Time Calculation (min) (ACUTE ONLY): 23 min   Charges:   PT Evaluation $PT Re-evaluation: 1 Procedure PT Treatments $Gait Training: 8-22 mins   PT G CodesMagda Kiel, [place] [phone] 07/24/2016   Reginia Naas 07/24/2016, 11:11 AM

## 2016-07-24 NOTE — Discharge Summary (Signed)
Physician Discharge Summary  Heather Hinton GEZ:[id number] DOB: [birth year] DOA: 07/14/2016  PCP: Neale Burly, MD  Admit date: 07/14/2016 Discharge date: 07/24/2016  Recommendations for Outpatient Follow-up:  1. Pt will need to follow up with PCP in 1-2 weeks post discharge 2. Please obtain BMP to evaluate electrolytes and kidney function 3. Please note that pt has an appointment scheduled on June 18th, 2018  Discharge Diagnoses:  Principal Problem:   Metastatic lung cancer (metastasis from lung to other site) [facility]) Active Problems:   Essential hypertension   Hypokalemia   Hyperglycemia   Symptomatic anemia   Thrombocytosis (HCC)   Cancer associated pain   Metastasis to bone (HCC)   Left hip pain   Palliative care by specialist   Iron deficiency anemia   Metastasis to brain [facility])  Discharge Condition: Stable  Diet recommendation: Heart healthy diet discussed in details   History of present illness:   Brief Narrative:  Patient is 56 year old female with known hypertension and recent history of spinal stenosis, presented with weakness, left hip pain, fevers and chills. Initial imaging studies notable for probable primary lung malignancy with metastatic disease in the ischial tuberosity on the left with significant tumor burden as well as metastatic disease and brain. Radiation oncology consulted, recommendation was to wait for pathology results to be back so that we can determine how to move forward with radiation. Patient may be candidate for stereotactic radiation as opposed to whole brain radiation. Patient is now status post biopsy of the left ischial mass, done on 07/16/2016 by interventional radiologist. Palliative care team also consulted for assistance on pain management.  Assessment and plan:  Lung mass, significant bone lesion, worrisome for metastatic lung cancer until proven otherwise - Additional imaging studies including MRI, also notable for cerebral  metastases - Radiation oncology already consulted, further recommendations pending biopsy results - Oncologist also following, assistance appreciated, outpatient appointment scheduled on June 18th, 2018 - Palliative care team also following, assistance appreciated - Continue dexamethasone 4 mg 3 times daily  Postobstructive pneumonia, right middle lobe - Initially started on IV antibiotics Zosyn on admissionand was successfully transitioned to Augmentin on 07/17/2016, completed therapy with Augmentin  - remains afebrile, oxygen saturations stable   Severe cancer pain - Started analgesia as needed and scheduled - pain controlled   Severe protein calorie malnutrition, malignant cachexia - continue nutritional supplements, no further changes in regimen needed  - stop IVF, continue current diet   Thrombocytosis - Suspect this is reactive from malignant process - overall improving   Severe microcytic anemia - Iron studies confirm evidence of iron deficiency as well as anemia of chronic disease - This is most likely secondary to malignancy as well as chronic blood loss, malabsorption syndrome - Patient has received one dose of IV iron 07/17/2016 per oncologist recommendation - Hg remains stable with no evidence of active bleeding    Essential hypertension - reasonable inpatient control   Hyperlipidemia - Continue statin  History of smoking tobacco - Over 40 years, already counseled on cessation   DVT prophylaxis: Lovenox Code Status: Full Family Communication: pt at bedside, awaiting call back from pt's family to discuss plan of care, pt apparently has no transport tonight  Disposition Plan: discuss with CM and SW, d/c in AM  Consultants:   Oncology  Radiation oncology  Palliative care  Procedures:   Left ischial bone biopsy on 07/16/2016  Antimicrobials:   Zosyn 07/15/2016 --> 07/17/2016  Augmentin 07/17/2016 --> 07/22/2016  Procedures/Studies: Dg  Chest 2 View  Result Date: 07/14/2016 CLINICAL DATA:  Fatigue EXAM: CHEST  2 VIEW COMPARISON:  04/15/2016 FINDINGS: Cardiac shadow is within normal limits. Lungs are well aerated bilaterally. Right middle lobe infiltrate with volume loss is noted. Additionally there is a a rounded masslike density in the lateral aspect of the right measuring approximately 3 cm. Considerable right peritracheal and right hilar adenopathy is noted. These changes are highly suspicious for underlying neoplasm. Further evaluation by means of CT of the chest with contrast is recommended. No bony abnormality is seen. IMPRESSION: Changes consistent with a right middle lobe mass and central hilar and mediastinal adenopathy with partial right middle lobe collapse. This is consistent with neoplasm and CT evaluation is recommended. Electronically Signed   By: Inez Catalina M.D.   On: 07/14/2016 16:33   Ct Chest W Contrast  Result Date: 07/14/2016 CLINICAL DATA:  Right lung mass and right middle lobe collapse EXAM: CT CHEST, ABDOMEN, AND PELVIS WITH CONTRAST TECHNIQUE: Multidetector CT imaging of the chest, abdomen and pelvis was performed following the standard protocol during bolus administration of intravenous contrast. CONTRAST:  146mL ISOVUE-300 IOPAMIDOL (ISOVUE-300) INJECTION 61% COMPARISON:  Chest x-ray from earlier in the same day. FINDINGS: CT CHEST FINDINGS Cardiovascular: The thoracic aorta shows a bovine it branching pattern. No aneurysmal dilatation or dissection is seen. No cardiac enlargement is noted. The pulmonary artery as visualized is within normal limits with the exception of attenuation of branches on the right secondary to hilar and mediastinal adenopathy. Mediastinum/Nodes: Thoracic inlet is within normal limits. Multiple mediastinal mobile masses are seen. One is noted in the anterior mediastinum measuring 5.8 x 2.9 cm. A second is noted anterior to the carina measuring at least 5.1 by 5.8 cm. This mildly displaces  the superior vena cava and causes mass effect upon the right main pulmonary artery. Subcarinal nodal mass is also noted measuring 4.5 by 3.4 cm in greatest dimension. Smaller right hilar nodes are seen. The largest of these measures 26 mm in short axis. The esophagus as visualize is within normal limits. Lungs/Pleura: The left lung demonstrates some emphysematous changes. No focal infiltrate or sizable effusion is seen. There is a 1.8 x 1.0 soft tissue density along the posteromedial aspect of the left lung. This appears to be within the chest wall as opposed to within the lung itself. The right lung also demonstrates significant emphysematous changes. There is a mass lesion which measures 2.8 by 3.0 cm in greatest transverse and AP dimension within the right middle lobe as seen on previous chest x-ray. Near complete collapse of the right middle lobe is seen secondary to central bronchial occlusion. Musculoskeletal: Degenerative changes of the thoracic spine are noted. No definitive lytic or sclerotic lesions are seen. CT ABDOMEN PELVIS FINDINGS Hepatobiliary: The liver demonstrates a focal hyperdense lesion likely representing a small hemangioma. The gallbladder is within normal limits. Pancreas: Pancreas is within normal limits. Spleen: Normal in size without focal abnormality. Adrenals/Urinary Tract: The adrenal glands are unremarkable. The kidneys demonstrate normal enhancement bilaterally. Renal cyst is noted on the left. A few small nonobstructing calculi are noted in the right kidney. Mild scarring in the right kidney is noted as well. The bladder is partially distended. Stomach/Bowel: No obstructive or inflammatory changes are seen. The appendix is within normal limits. Vascular/Lymphatic: Aortic atherosclerosis. No enlarged abdominal or pelvic lymph nodes. Reproductive: The uterus is within normal limits and somewhat displaced to the right. Ovarian cystic changes are seen bilaterally. Other: No abdominal  wall hernia or abnormality. No abdominopelvic ascites. Musculoskeletal: In the left ischial tuberosity and extending superiorly into the posterior aspect of the left acetabulum, there is a lytic lesion with significant soft tissue component. It nearly completely destroyed is the ischial tuberosity and posterior elements of the right acetabulum. The soft tissue component extends inferiorly along the obturator foramen and measures approximately 12 by 8.6 cm in greatest dimension. It also extends superiorly along the left pelvic wall. IMPRESSION: Right middle lobe mass lesion with associated hilar and mediastinal adenopathy and near complete right middle lobe collapse. This is consistent with a primary pulmonary neoplasm with metastatic lesions till proven otherwise. PET-CT and tissue sampling are recommended for further evaluation. Small soft tissue nodule along the posterior costophrenic angle medially on the left. This may represent a small metastatic focus. Bony metastatic disease involving the ischial tuberosity on the left with large significant surrounding soft tissue component consistent with metastatic disease. Electronically Signed   By: Inez Catalina M.D.   On: 07/14/2016 18:10   [name] FA Contrast  Result Date: 07/18/2016 CLINICAL DATA:  56 year old female with lung cancer and MRI positive for brain metastases recently. Study for stereotactic radiosurgery planning. By report, the patient had an iron infusion yesterday. EXAM: MRI HEAD WITHOUT AND WITH CONTRAST TECHNIQUE: Multiplanar, multiecho pulse sequences of the brain and surrounding structures were obtained without and with intravenous contrast. CONTRAST:  13mL MULTIHANCE GADOBENATE DIMEGLUMINE 529 MG/ML IV SOLN COMPARISON:  Brain MRI 07/16/2016. FINDINGS: Brain: Susceptibility artifact related to recent iron infusion, although not severe on T1 weighted imaging. The 6 enhancing brain metastases demonstrated on 07/16/2016 are stable in size and  configuration, although their margins are indistinct today. The largest lesion in the left occipital lobe may have a small adjacent 3 mm metastasis as seen on series 11, image 67 today. No new brain metastasis is identified. Vasogenic edema associated with the lesions is unchanged, and maximal in the left occipital lobe. Mild regional mass effect there, but otherwise no significant intracranial mass effect. No midline shift. Patent basilar cisterns. Abnormal T2 and FLAIR hyperintensity extending from the left cerebellar peduncle into the left lateral cerebellar hemisphere is unchanged and more resembles encephalomalacia. No dural or leptomeningeal enhancement is evident on these recent MRIs. No restricted diffusion or evidence of acute infarction. No acute intracranial hemorrhage is evident. T2* imaging is degraded. Vascular: Major intracranial vascular flow voids are stable. Skull and upper cervical spine: Stable bone marrow signal. Negative visualized cervical spine. Sinuses/Orbits: Stable and negative. Other: Mastoids remain clear.  Negative scalp soft tissues. IMPRESSION: 1. Susceptibility artifact related to iron infusion yesterday. The brain metastases identified on 07/16/2016 are stable, but their margins are less distinct due to the iron artifact. Note that there may be a small subcentimeter metastasis adjacent to the largest mass in the left occipital lobe (series 11, image 67). Stable vasogenic edema and mild intracranial mass effect. 2. No new brain metastasis identified. 3. Stable signal abnormality along the left cerebellar peduncle and left lateral cerebellum which most resembles encephalomalacia. Electronically Signed   By: Genevie Ann M.D.   On: 07/18/2016 15:42   [name] OZ Contrast  Result Date: 07/16/2016 CLINICAL DATA:  Brain metastasis.  Metastatic lung cancer. EXAM: MRI HEAD WITHOUT AND WITH CONTRAST TECHNIQUE: Multiplanar, multiecho pulse sequences of the brain and surrounding structures  were obtained without and with intravenous contrast. CONTRAST:  35mL MULTIHANCE GADOBENATE DIMEGLUMINE 529 MG/ML IV SOLN COMPARISON:  CT head without contrast 08/22/2011  FINDINGS: Brain: Multiple enhancing brain lesions are compatible with metastatic disease. The largest lesion is in the left occipital lobe measuring at 2.0 x 1.7 x 1.9 cm. There is surrounding vasogenic edema with effacement of the adjacent sulci. A 7 mm lesion is present anteriorly in the left frontal lobe on image 34 of series 11. Three additional 3 mm lesions are present. Lesions are present in the anterior inferior frontal lobes bilaterally on image 25 of series 11 and in the medial right parietal lobe on image 35 of series 11. A 4 mm lesion is present along the inferior left temporal lobe. Vascular: Flow is present in the major intracranial arteries. Skull and upper cervical spine: The skullbase is within normal limits. The upper cervical spine is unremarkable. Sinuses/Orbits: The paranasal sinuses can the mastoid air cells are clear. Bilateral globes and orbits are within normal limits. IMPRESSION: 1. Six enhancing lesions scattered throughout the brain compatible with metastatic disease. The largest lesion involves the left occipital lobe. 2. Surrounding vasogenic edema involves the 2 largest lesions with mass effect in the left occipital left lobe evidence by effacement of the sulci. Electronically Signed   By: San Morelle M.D.   On: 07/16/2016 18:55   Ct Abdomen Pelvis W Contrast  Result Date: 07/14/2016 CLINICAL DATA:  Right lung mass and right middle lobe collapse EXAM: CT CHEST, ABDOMEN, AND PELVIS WITH CONTRAST TECHNIQUE: Multidetector CT imaging of the chest, abdomen and pelvis was performed following the standard protocol during bolus administration of intravenous contrast. CONTRAST:  183mL ISOVUE-300 IOPAMIDOL (ISOVUE-300) INJECTION 61% COMPARISON:  Chest x-ray from earlier in the same day. FINDINGS: CT CHEST FINDINGS  Cardiovascular: The thoracic aorta shows a bovine it branching pattern. No aneurysmal dilatation or dissection is seen. No cardiac enlargement is noted. The pulmonary artery as visualized is within normal limits with the exception of attenuation of branches on the right secondary to hilar and mediastinal adenopathy. Mediastinum/Nodes: Thoracic inlet is within normal limits. Multiple mediastinal mobile masses are seen. One is noted in the anterior mediastinum measuring 5.8 x 2.9 cm. A second is noted anterior to the carina measuring at least 5.1 by 5.8 cm. This mildly displaces the superior vena cava and causes mass effect upon the right main pulmonary artery. Subcarinal nodal mass is also noted measuring 4.5 by 3.4 cm in greatest dimension. Smaller right hilar nodes are seen. The largest of these measures 26 mm in short axis. The esophagus as visualize is within normal limits. Lungs/Pleura: The left lung demonstrates some emphysematous changes. No focal infiltrate or sizable effusion is seen. There is a 1.8 x 1.0 soft tissue density along the posteromedial aspect of the left lung. This appears to be within the chest wall as opposed to within the lung itself. The right lung also demonstrates significant emphysematous changes. There is a mass lesion which measures 2.8 by 3.0 cm in greatest transverse and AP dimension within the right middle lobe as seen on previous chest x-ray. Near complete collapse of the right middle lobe is seen secondary to central bronchial occlusion. Musculoskeletal: Degenerative changes of the thoracic spine are noted. No definitive lytic or sclerotic lesions are seen. CT ABDOMEN PELVIS FINDINGS Hepatobiliary: The liver demonstrates a focal hyperdense lesion likely representing a small hemangioma. The gallbladder is within normal limits. Pancreas: Pancreas is within normal limits. Spleen: Normal in size without focal abnormality. Adrenals/Urinary Tract: The adrenal glands are unremarkable. The  kidneys demonstrate normal enhancement bilaterally. Renal cyst is noted on the left. A  few small nonobstructing calculi are noted in the right kidney. Mild scarring in the right kidney is noted as well. The bladder is partially distended. Stomach/Bowel: No obstructive or inflammatory changes are seen. The appendix is within normal limits. Vascular/Lymphatic: Aortic atherosclerosis. No enlarged abdominal or pelvic lymph nodes. Reproductive: The uterus is within normal limits and somewhat displaced to the right. Ovarian cystic changes are seen bilaterally. Other: No abdominal wall hernia or abnormality. No abdominopelvic ascites. Musculoskeletal: In the left ischial tuberosity and extending superiorly into the posterior aspect of the left acetabulum, there is a lytic lesion with significant soft tissue component. It nearly completely destroyed is the ischial tuberosity and posterior elements of the right acetabulum. The soft tissue component extends inferiorly along the obturator foramen and measures approximately 12 by 8.6 cm in greatest dimension. It also extends superiorly along the left pelvic wall. IMPRESSION: Right middle lobe mass lesion with associated hilar and mediastinal adenopathy and near complete right middle lobe collapse. This is consistent with a primary pulmonary neoplasm with metastatic lesions till proven otherwise. PET-CT and tissue sampling are recommended for further evaluation. Small soft tissue nodule along the posterior costophrenic angle medially on the left. This may represent a small metastatic focus. Bony metastatic disease involving the ischial tuberosity on the left with large significant surrounding soft tissue component consistent with metastatic disease. Electronically Signed   By: Inez Catalina M.D.   On: 07/14/2016 18:10   Ct Biopsy  Result Date: 07/16/2016 CLINICAL DATA:  Right middle lobe mass lesion with regional adenopathy. Destructive left ischial mass with soft tissue  component. EXAM: CT GUIDED CORE BIOPSY OF LEFT ISCHIAL MASS ANESTHESIA/SEDATION: Intravenous Fentanyl and Versed were administered as conscious sedation during continuous monitoring of the patient's level of consciousness and physiological / cardiorespiratory status by the radiology RN, with a total moderate sedation time of 10 minutes. PROCEDURE: The procedure risks, benefits, and alternatives were explained to the patient. Questions regarding the procedure were encouraged and answered. The patient understands and consents to the procedure. patient placed prone. Select axial scans obtained through the pelvis. The left ischial mass was identified and an appropriate skin entry site determined and marked. The operative field was prepped with chlorhexidinein a sterile fashion, and a sterile drape was applied covering the operative field. A sterile gown and sterile gloves were used for the procedure. Local anesthesia was provided with 1% Lidocaine. Under CT fluoroscopic guidance, a 17 gauge trocar needle was advanced to the margin of the lesion. Once needle tip position was confirmed, coaxial 18-gauge core biopsy samples were obtained, submitted in formalin to surgical pathology. The guide needle was removed. Postprocedure scans show no hemorrhage or other apparent complication. The patient tolerated the procedure well. COMPLICATIONS: None immediate FINDINGS: Lytic left ischial mass with extraosseous soft tissue component was again localized. Representative core biopsy samples obtained as above. IMPRESSION: 1. Technically successful core biopsy, left ischial mass. Electronically Signed   By: Lucrezia Europe M.D.   On: 07/16/2016 16:42     Discharge Exam: Vitals:   07/23/16 2049 07/24/16 0543  BP: 121/63 128/62  Pulse: 88 84  Resp: 12 15  Temp: 98.2 F (36.8 C) 98.4 F (36.9 C)   Vitals:   07/23/16 0400 07/23/16 1455 07/23/16 2049 07/24/16 0543  BP: 132/83 (!) 141/63 121/63 128/62  Pulse: 80 69 88 84  Resp:  14 16 12 15   Temp: 98.4 F (36.9 C) 98.9 F (37.2 C) 98.2 F (36.8 C) 98.4 F (36.9 C)  TempSrc: Oral Oral Oral Oral  SpO2: 99% 100% 100% 100%  Weight:      Height:        General: Pt is alert, follows commands appropriately, not in acute distress Cardiovascular: Regular rate and rhythm, no rubs, no gallops Respiratory: Clear to auscultation bilaterally, no wheezing, no crackles, no rhonchi Abdominal: Soft, non tender, non distended, bowel sounds +, no guarding Extremities: no edema, no cyanosis, pulses palpable bilaterally DP and PT Neuro: Grossly nonfocal  Discharge Instructions    Allergies as of 07/24/2016      Reactions   Iron Nausea And Vomiting      Medication List    TAKE these medications   amLODipine 10 MG tablet Commonly known as:  NORVASC Take 10 mg by mouth at bedtime.   dexamethasone 4 MG tablet Commonly known as:  DECADRON Take 1 tablet (4 mg total) by mouth every 8 (eight) hours.   gabapentin 300 MG capsule Commonly known as:  NEURONTIN Take 300 mg by mouth 2 (two) times daily.   HYDROcodone-acetaminophen 5-325 MG tablet Commonly known as:  NORCO/VICODIN Take 1-2 tablets by mouth every 4 (four) hours as needed for moderate pain or severe pain.   lovastatin 20 MG tablet Commonly known as:  MEVACOR Take 20 mg by mouth at bedtime.   morphine 15 MG 12 hr tablet Commonly known as:  MS CONTIN Take 1 tablet (15 mg total) by mouth every 12 (twelve) hours.   multivitamin with minerals Tabs tablet Take 1 tablet by mouth daily.   polyethylene glycol packet Commonly known as:  MIRALAX / GLYCOLAX Take 17 g by mouth daily.   senna-docusate 8.6-50 MG tablet Commonly known as:  Senokot-S Take 2 tablets by mouth 2 (two) times daily.            Durable Medical Equipment        Start     Ordered   07/23/16 1038  For home use only DME 3 n 1  Once     07/23/16 1039   07/18/16 1300  For home use only DME Walker rolling  Once    Question:  Patient  needs a walker to treat with the following condition  Answer:  Weakness   07/18/16 1300       Follow-up Information    Neale Burly, MD Follow up.   Specialty:  Internal Medicine Contact information: [place] [address] [phone]        Heath Lark, MD Follow up on 07/30/2016.   Specialty:  Hematology and Oncology Contact information: [place] [id number] Story, Ni, MD .   Specialty:  Hematology and Oncology Contact information: [address] [place] [address] [phone]            The results of significant diagnostics from this hospitalization (including imaging, microbiology, ancillary and laboratory) are listed below for reference.     Microbiology: No results found for this or any previous visit (from the past 240 hour(s)).   Labs: Basic Metabolic Panel:  Recent Labs Lab 07/20/16 0402 07/21/16 0519 07/22/16 0423 07/23/16 0435 07/24/16 0424  NA 137 140 138 137 138  K 4.7 5.0 4.5 4.6 4.4  CL 107 107 104 106 101  CO2 21* 24 24 22 26   GLUCOSE 127* 133* 131* 130* 132*  BUN 22* 21* 20 21* 24*  CREATININE 0.66 0.73 0.67 0.56 0.67  CALCIUM  9.0 9.3 9.2 9.2 9.8   CBC:  Recent Labs Lab 07/20/16 0402 07/21/16 0519 07/22/16 0423 07/23/16 0435 07/24/16 0424  WBC 49.0* 41.9* 37.6* 35.7* 36.4*  HGB 10.9* 10.8* 11.2* 11.2* 12.2  HCT 34.9* 35.1* 35.3* 35.4* 39.2  MCV 81.2 81.1 80.6 80.5 81.3  PLT 617* 635* 606* 602* 584*    SIGNED: Time coordinating discharge:  30 minutes  Faye Ramsay, MD  [facility] 07/24/2016, 3:00 PM Pager [phone]  If 7PM-7AM, please contact night-coverage [email or web address] Password [email or web address]

## 2016-07-25 ENCOUNTER — Ambulatory Visit
Admit: 2016-07-25 | Discharge: 2016-07-25 | Disposition: A | Discharge status: Home / Self Care | Payer: Medicaid Other | Attending: Radiation Oncology | Admitting: Radiation Oncology

## 2016-07-25 DIAGNOSIS — Z510 Encounter for antineoplastic radiation therapy: Secondary | ICD-10-CM | POA: Diagnosis not present

## 2016-07-25 DIAGNOSIS — C7931 Secondary malignant neoplasm of brain: Secondary | ICD-10-CM

## 2016-07-25 MED ORDER — BISACODYL 10 MG RE SUPP
10.0000 mg | Freq: Every day | RECTAL | Status: DC
  Administered 2016-07-25: 10 mg via RECTAL
  Filled 2016-07-25: qty 1

## 2016-07-25 NOTE — Progress Notes (Signed)
  Radiation Oncology         [phone] ________________________________  Name: Ryley Teater MRN: [id number]  Date: 07/25/2016  DOB: 1960-09-13  SIMULATION AND TREATMENT PLANNING NOTE    ICD-10-CM   1. 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  Identity was confirmed.  All relevant records and images related to the planned course of therapy were reviewed.  The patient freely provided informed written consent to proceed with treatment after reviewing the details related to the planned course of therapy. The consent form was witnessed and verified by the simulation staff. Intravenous access was established for contrast administration. Then, the patient was set-up in a stable reproducible supine position for radiation therapy.  A relocatable thermoplastic stereotactic head frame was fabricated for precise immobilization.  CT images were obtained.  Surface markings were placed.  The CT images were loaded into the planning software and fused with the patient's targeting MRI scan.  Then the target and avoidance structures were contoured.  Treatment planning then occurred.  The radiation prescription was entered and confirmed.  I have requested 3D planning  I have requested a DVH of the following structures: Brain stem, brain, left eye, right eye, lenses, optic chiasm, target volumes, uninvolved brain, and normal tissue.    SPECIAL TREATMENT PROCEDURE:  The planned course of therapy using radiation constitutes a special treatment procedure. Special care is required in the management of this patient for the following reasons. This treatment constitutes a Special Treatment Procedure for the following reason: High dose per fraction requiring special monitoring for increased toxicities of treatment including daily imaging.  The special nature of the planned course  of radiotherapy will require increased physician supervision and oversight to ensure patient's safety with optimal treatment outcomes.  PLAN:  The patient will receive 20 Gy in 1 fraction to each of 7 targets with a single isocenter set-up.  ________________________________  Sheral Apley. Tammi Klippel, M.D.

## 2016-07-25 NOTE — Progress Notes (Signed)
Discharge instructions given,prescriptions handed to patient,verbalized understanding. Follow-up appointments discussed.Teach back utilized. All paper works Copy  and place in the d/c packet. Awaiting for her transportation. Patient in good spirit.

## 2016-07-25 NOTE — Progress Notes (Signed)
[facility]  I have examined the patient today and reviewed the chart. The plan by Dr Doyle Askew if for the patient to discharge home today. I have spoken with Dr Alvy Bimler as well about discharge and follow up. It appears, based upon my chart review and discussion with the staff and Dr Alvy Bimler that the patient has been insisting upon staying in the hospital until 6/18. While discussing her discharge plan today I noted manipulative behavior and desire to stay in the hospital. She states that despite wanting to go home, her doctors feel that she is not ready to go home. She further states that her husband is off from work on Saturday and that will be the only day that he will be able to pick her up.    I have ensured her that based on my review of the chart, discussion with her oncologist, review of the information that Dr Olen Pel has signed out to me and also based upon her exam,  she is stable for discharge today. Her husband should be off of work by this evening. Apparently, her husband could not pick her up yesterday. If no one is able to pick her up, the hospital will arrange transport.   Please see d/c summary outlined by Dr Olen Pel from 07/24/16.   Debbe Odea, MD

## 2016-07-25 NOTE — Progress Notes (Signed)
Patient d/s home,stable.

## 2016-07-25 NOTE — Progress Notes (Signed)
Met with pt again at bedside to ask if she is interested in home health services. Pt declines home health services. Pt informed that if she changes her mind she can call her MD office and they can help her set it up. Pt has already had RW and 3in1 delivered to room. Marney Doctor RN,BSN,NCM [phone]

## 2016-07-25 NOTE — Progress Notes (Signed)
Phoned [facility] and spoke with Butch Penny, Therapist, sports. Explained the patient is scheduled for simulation today She confirms the patient has a patent left forearm 22 gauge IV that is saline locked. Butch Penny confirms the patient is alert and oriented x 3 and able to sign consent. She reports the patient can travel via wheelchair. She reports the patient is NOT under any precautions. Communicated these findings to the radiation team.

## 2016-07-26 ENCOUNTER — Ambulatory Visit
Admission: RE | Admit: 2016-07-26 | Discharge: 2016-07-26 | Disposition: A | Discharge status: Home / Self Care | Payer: Medicaid Other | Source: Ambulatory Visit | Attending: Radiation Oncology | Admitting: Radiation Oncology

## 2016-07-26 DIAGNOSIS — Z510 Encounter for antineoplastic radiation therapy: Secondary | ICD-10-CM | POA: Diagnosis not present

## 2016-07-26 DIAGNOSIS — M25552 Pain in left hip: Secondary | ICD-10-CM | POA: Diagnosis not present

## 2016-07-26 DIAGNOSIS — C7951 Secondary malignant neoplasm of bone: Secondary | ICD-10-CM | POA: Diagnosis not present

## 2016-07-26 DIAGNOSIS — C7802 Secondary malignant neoplasm of left lung: Secondary | ICD-10-CM | POA: Diagnosis not present

## 2016-07-26 DIAGNOSIS — C7931 Secondary malignant neoplasm of brain: Secondary | ICD-10-CM | POA: Diagnosis not present

## 2016-07-26 DIAGNOSIS — C3491 Malignant neoplasm of unspecified part of right bronchus or lung: Secondary | ICD-10-CM | POA: Diagnosis not present

## 2016-07-27 ENCOUNTER — Encounter (HOSPITAL_COMMUNITY): Payer: Self-pay

## 2016-07-27 ENCOUNTER — Ambulatory Visit
Admission: RE | Admit: 2016-07-27 | Discharge: 2016-07-27 | Disposition: A | Discharge status: Home / Self Care | Payer: Medicaid Other | Source: Ambulatory Visit | Attending: Radiation Oncology | Admitting: Radiation Oncology

## 2016-07-27 DIAGNOSIS — Z510 Encounter for antineoplastic radiation therapy: Secondary | ICD-10-CM | POA: Diagnosis not present

## 2016-07-30 ENCOUNTER — Ambulatory Visit
Admission: RE | Admit: 2016-07-30 | Discharge: 2016-07-30 | Disposition: A | Discharge status: Home / Self Care | Payer: Medicaid Other | Source: Ambulatory Visit | Attending: Radiation Oncology | Admitting: Radiation Oncology

## 2016-07-30 ENCOUNTER — Telehealth: Payer: Self-pay | Admitting: Hematology and Oncology

## 2016-07-30 ENCOUNTER — Other Ambulatory Visit: Payer: Self-pay | Admitting: Hematology and Oncology

## 2016-07-30 ENCOUNTER — Ambulatory Visit (HOSPITAL_BASED_OUTPATIENT_CLINIC_OR_DEPARTMENT_OTHER): Payer: Medicaid Other | Admitting: Hematology and Oncology

## 2016-07-30 VITALS — BP 115/75 | HR 103 | Temp 97.5°F | Resp 18 | Ht 67.0 in | Wt 132.2 lb

## 2016-07-30 DIAGNOSIS — E538 Deficiency of other specified B group vitamins: Secondary | ICD-10-CM | POA: Diagnosis not present

## 2016-07-30 DIAGNOSIS — Z7189 Other specified counseling: Secondary | ICD-10-CM

## 2016-07-30 DIAGNOSIS — Z510 Encounter for antineoplastic radiation therapy: Secondary | ICD-10-CM | POA: Diagnosis not present

## 2016-07-30 DIAGNOSIS — C7931 Secondary malignant neoplasm of brain: Secondary | ICD-10-CM | POA: Diagnosis not present

## 2016-07-30 DIAGNOSIS — D519 Vitamin B12 deficiency anemia, unspecified: Secondary | ICD-10-CM | POA: Diagnosis not present

## 2016-07-30 DIAGNOSIS — C3490 Malignant neoplasm of unspecified part of unspecified bronchus or lung: Secondary | ICD-10-CM | POA: Diagnosis not present

## 2016-07-30 DIAGNOSIS — G893 Neoplasm related pain (acute) (chronic): Secondary | ICD-10-CM | POA: Diagnosis not present

## 2016-07-30 DIAGNOSIS — C7951 Secondary malignant neoplasm of bone: Secondary | ICD-10-CM | POA: Diagnosis not present

## 2016-07-30 HISTORY — DX: Vitamin B12 deficiency anemia, unspecified: D51.9

## 2016-07-30 MED ORDER — MORPHINE SULFATE ER 15 MG PO TBCR: 15.0000 mg | EXTENDED_RELEASE_TABLET | Freq: Two times a day (BID) | ORAL | 0 refills | Status: DC

## 2016-07-30 MED ORDER — LIDOCAINE-PRILOCAINE 2.5-2.5 % EX CREA: 1.0000 "application " | TOPICAL_CREAM | CUTANEOUS | 6 refills | Status: AC | PRN

## 2016-07-30 NOTE — Telephone Encounter (Signed)
Scheduled appt per 6/18 los - Gave patient AVS and calender per LOS.  

## 2016-07-30 NOTE — Progress Notes (Signed)
START ON PATHWAY REGIMEN - Non-Small Cell Lung     A cycle is every 21 days:     Pemetrexed      Carboplatin   **Always confirm dose/schedule in your pharmacy ordering system**    Patient Characteristics: Stage IV Metastatic, Non Squamous, Initial Chemotherapy/Immunotherapy, PS = 2 AJCC T Category: T4 Current Disease Status: Distant Metastases AJCC N Category: N3 AJCC M Category: M1c AJCC 8 Stage Grouping: IVB Histology: Non Squamous Cell ROS1 Rearrangement Status: Awaiting Test Results T790M Mutation Status: Not Applicable - EGFR Mutation Negative/Unknown Other Mutations/Biomarkers: No Other Actionable Mutations PD-L1 Expression Status: PD-L1 Negative Chemotherapy/Immunotherapy LOT: Initial Chemotherapy/Immunotherapy Molecular Targeted Therapy: Not Appropriate ALK Translocation Status: Awaiting Test Results Would you be surprised if this patient died  in the next year? I would be surprised if this patient died in the next year EGFR Mutation Status: Awaiting Test Results BRAF V600E Mutation Status: Awaiting Test Results Performance Status: PS = 2 Intent of Therapy: Non-Curative / Palliative Intent, Discussed with Patient

## 2016-07-31 ENCOUNTER — Encounter: Payer: Self-pay | Admitting: Hematology and Oncology

## 2016-07-31 DIAGNOSIS — Z7189 Other specified counseling: Secondary | ICD-10-CM | POA: Insufficient documentation

## 2016-07-31 NOTE — Assessment & Plan Note (Signed)
She will be completing radiation therapy soon I would defer to radiation oncologist for plan for steroid taper

## 2016-07-31 NOTE — Progress Notes (Signed)
[place] OFFICE PROGRESS NOTE  Patient Care Team: Neale Burly, MD as PCP - General (Internal Medicine)  SUMMARY OF ONCOLOGIC HISTORY: Oncology History   PD-I testing negative     Metastatic lung cancer (metastasis from lung to other site) [facility])   07/14/2016 - 07/24/2016 Hospital Admission    Patient presented with weakness, left hip pain, fevers and chills. Initial imaging studies notable for probable primary lung malignancy with metastatic disease in the ischial tuberosity on the left with significant tumor burden as well as metastatic disease and brain.       07/14/2016 Imaging    CT chest, abdomen and pelvis showed: Right middle lobe mass lesion with associated hilar and mediastinal adenopathy and near complete right middle lobe collapse. This is consistent with a primary pulmonary neoplasm with metastatic lesions till proven otherwise. PET-CT and tissue sampling are recommended for further evaluation.  Small soft tissue nodule along the posterior costophrenic angle medially on the left. This may represent a small metastatic focus.  Bony metastatic disease involving the ischial tuberosity on the left with large significant surrounding soft tissue component consistent with metastatic disease.       07/16/2016 Pathology Results    Soft Tissue Needle Core Biopsy, left ischial - METASTATIC POORLY DIFFERENTIATED CARCINOMA, SEE COMMENT. Microscopic Comment The core biopsies show sheets of malignant cells. Immunohistochemistry is positive for cytokeratin 7. The cells are negative for cytokeratin 20, cytokeratin 5/6, p63, NapsinA, TTF-1, MelanA, S100, and PAX-8. The immunoprofile is somewhat non-specific, but in the setting of the patient's lung mass this may represent a lung primary. Foundation One and PDL-1 testing will be ordered.      07/16/2016 Pathology Results    PD-1 testing on tissue sample was negative      07/16/2016 Procedure    Technically successful core  biopsy, left ischial mass      07/16/2016 Imaging    MR brain 1. Six enhancing lesions scattered throughout the brain compatible with metastatic disease. The largest lesion involves the left occipital lobe. 2. Surrounding vasogenic edema involves the 2 largest lesions with mass effect in the left occipital left lobe evidence by effacement of the sulci      07/18/2016 -  Radiation Therapy    She received radiation treatment to her brain, chest and bone      07/18/2016 Imaging    MR brain 1. Susceptibility artifact related to iron infusion yesterday. The brain metastases identified on 07/16/2016 are stable, but their margins are less distinct due to the iron artifact. Note that there may be a small subcentimeter metastasis adjacent to the largest mass in the left occipital lobe (series 11, image 67). Stable vasogenic edema and mild intracranial mass effect. 2. No new brain metastasis identified. 3. Stable signal abnormality along the left cerebellar peduncle and left lateral cerebellum which most resembles encephalomalacia.       INTERVAL HISTORY: Please see below for problem oriented charting. She returns for further follow-up She is completing radiation treatment soon She denies problems with headache, seizure or new neurological deficit She is able to mobilize at home with a walker Her pain appears to be well controlled with current prescription pain medicine She denies nausea or constipation She is here today accompanied by multiple family members to discuss plan of care related to systemic chemotherapy  REVIEW OF SYSTEMS:   Constitutional: Denies fevers, chills or abnormal weight loss Eyes: Denies blurriness of vision Ears, nose, mouth, throat, and face: Denies mucositis  or sore throat Respiratory: Denies cough, dyspnea or wheezes Cardiovascular: Denies palpitation, chest discomfort or lower extremity swelling Gastrointestinal:  Denies nausea, heartburn or change in bowel  habits Skin: Denies abnormal skin rashes Lymphatics: Denies new lymphadenopathy or easy bruising Neurological:Denies numbness, tingling or new weaknesses Behavioral/Psych: Mood is stable, no new changes  All other systems were reviewed with the patient and are negative.  I have reviewed the past medical history, past surgical history, social history and family history with the patient and they are unchanged from previous note.  ALLERGIES:  is allergic to iron.  MEDICATIONS:  Current Outpatient Prescriptions  Medication Sig Dispense Refill  . amLODipine (NORVASC) 10 MG tablet Take 10 mg by mouth at bedtime.    Marland Kitchen dexamethasone (DECADRON) 4 MG tablet Take 1 tablet (4 mg total) by mouth every 8 (eight) hours. 90 tablet 0  . gabapentin (NEURONTIN) 300 MG capsule Take 300 mg by mouth 2 (two) times daily.    Marland Kitchen HYDROcodone-acetaminophen (NORCO/VICODIN) 5-325 MG tablet Take 1-2 tablets by mouth every 4 (four) hours as needed for moderate pain or severe pain. 30 tablet 0  . lovastatin (MEVACOR) 20 MG tablet Take 20 mg by mouth at bedtime.    Marland Kitchen morphine (MS CONTIN) 15 MG 12 hr tablet Take 1 tablet (15 mg total) by mouth every 12 (twelve) hours. 60 tablet 0  . Multiple Vitamin (MULTIVITAMIN WITH MINERALS) TABS tablet Take 1 tablet by mouth daily.    . polyethylene glycol (MIRALAX / GLYCOLAX) packet Take 17 g by mouth daily. 14 each 0  . senna-docusate (SENOKOT-S) 8.6-50 MG tablet Take 2 tablets by mouth 2 (two) times daily.    Marland Kitchen lidocaine-prilocaine (EMLA) cream Apply 1 application topically as needed. 30 g 6   No current facility-administered medications for this visit.     PHYSICAL EXAMINATION: ECOG PERFORMANCE STATUS: 2 - Symptomatic, <50% confined to bed  Vitals:   07/30/16 1222  BP: 115/75  Pulse: (!) 103  Resp: 18  Temp: 97.5 F (36.4 C)   Filed Weights   07/30/16 1222  Weight: 132 lb 3.2 oz (60 kg)    GENERAL:alert, no distress and comfortable SKIN: skin color, texture, turgor  are normal, no rashes or significant lesions EYES: normal, Conjunctiva are pink and non-injected, sclera clear Musculoskeletal:no cyanosis of digits and no clubbing  NEURO: alert & oriented x 3 with fluent speech, no focal motor/sensory deficits  LABORATORY DATA:  I have reviewed the data as listed    Component Value Date/Time   NA 138 07/24/2016 0424   K 4.4 07/24/2016 0424   CL 101 07/24/2016 0424   CO2 26 07/24/2016 0424   GLUCOSE 132 (H) 07/24/2016 0424   BUN 24 (H) 07/24/2016 0424   CREATININE 0.67 07/24/2016 0424   CALCIUM 9.8 07/24/2016 0424   PROT 7.1 07/16/2016 0440   ALBUMIN 2.3 (L) 07/16/2016 0440   AST 10 (L) 07/16/2016 0440   ALT 11 (L) 07/16/2016 0440   ALKPHOS 138 (H) 07/16/2016 0440   BILITOT 0.6 07/16/2016 0440   GFRNONAA >60 07/24/2016 0424   GFRAA >60 07/24/2016 0424    No results found for: SPEP, UPEP  Lab Results  Component Value Date   WBC 36.4 (H) 07/24/2016   NEUTROABS 28.0 (H) 07/16/2016   HGB 12.2 07/24/2016   HCT 39.2 07/24/2016   MCV 81.3 07/24/2016   PLT 584 (H) 07/24/2016      Chemistry      Component Value Date/Time   NA 138  07/24/2016 0424   K 4.4 07/24/2016 0424   CL 101 07/24/2016 0424   CO2 26 07/24/2016 0424   BUN 24 (H) 07/24/2016 0424   CREATININE 0.67 07/24/2016 0424      Component Value Date/Time   CALCIUM 9.8 07/24/2016 0424   ALKPHOS 138 (H) 07/16/2016 0440   AST 10 (L) 07/16/2016 0440   ALT 11 (L) 07/16/2016 0440   BILITOT 0.6 07/16/2016 0440       RADIOGRAPHIC STUDIES: I have personally reviewed the radiological images as listed and agreed with the findings in the report. Dg Chest 2 View  Result Date: 07/14/2016 CLINICAL DATA:  Fatigue EXAM: CHEST  2 VIEW COMPARISON:  04/15/2016 FINDINGS: Cardiac shadow is within normal limits. Lungs are well aerated bilaterally. Right middle lobe infiltrate with volume loss is noted. Additionally there is a a rounded masslike density in the lateral aspect of the right measuring  approximately 3 cm. Considerable right peritracheal and right hilar adenopathy is noted. These changes are highly suspicious for underlying neoplasm. Further evaluation by means of CT of the chest with contrast is recommended. No bony abnormality is seen. IMPRESSION: Changes consistent with a right middle lobe mass and central hilar and mediastinal adenopathy with partial right middle lobe collapse. This is consistent with neoplasm and CT evaluation is recommended. Electronically Signed   By: Inez Catalina M.D.   On: 07/14/2016 16:33   Ct Chest W Contrast  Result Date: 07/14/2016 CLINICAL DATA:  Right lung mass and right middle lobe collapse EXAM: CT CHEST, ABDOMEN, AND PELVIS WITH CONTRAST TECHNIQUE: Multidetector CT imaging of the chest, abdomen and pelvis was performed following the standard protocol during bolus administration of intravenous contrast. CONTRAST:  113m ISOVUE-300 IOPAMIDOL (ISOVUE-300) INJECTION 61% COMPARISON:  Chest x-ray from earlier in the same day. FINDINGS: CT CHEST FINDINGS Cardiovascular: The thoracic aorta shows a bovine it branching pattern. No aneurysmal dilatation or dissection is seen. No cardiac enlargement is noted. The pulmonary artery as visualized is within normal limits with the exception of attenuation of branches on the right secondary to hilar and mediastinal adenopathy. Mediastinum/Nodes: Thoracic inlet is within normal limits. Multiple mediastinal mobile masses are seen. One is noted in the anterior mediastinum measuring 5.8 x 2.9 cm. A second is noted anterior to the carina measuring at least 5.1 by 5.8 cm. This mildly displaces the superior vena cava and causes mass effect upon the right main pulmonary artery. Subcarinal nodal mass is also noted measuring 4.5 by 3.4 cm in greatest dimension. Smaller right hilar nodes are seen. The largest of these measures 26 mm in short axis. The esophagus as visualize is within normal limits. Lungs/Pleura: The left lung demonstrates  some emphysematous changes. No focal infiltrate or sizable effusion is seen. There is a 1.8 x 1.0 soft tissue density along the posteromedial aspect of the left lung. This appears to be within the chest wall as opposed to within the lung itself. The right lung also demonstrates significant emphysematous changes. There is a mass lesion which measures 2.8 by 3.0 cm in greatest transverse and AP dimension within the right middle lobe as seen on previous chest x-ray. Near complete collapse of the right middle lobe is seen secondary to central bronchial occlusion. Musculoskeletal: Degenerative changes of the thoracic spine are noted. No definitive lytic or sclerotic lesions are seen. CT ABDOMEN PELVIS FINDINGS Hepatobiliary: The liver demonstrates a focal hyperdense lesion likely representing a small hemangioma. The gallbladder is within normal limits. Pancreas: Pancreas is within normal  limits. Spleen: Normal in size without focal abnormality. Adrenals/Urinary Tract: The adrenal glands are unremarkable. The kidneys demonstrate normal enhancement bilaterally. Renal cyst is noted on the left. A few small nonobstructing calculi are noted in the right kidney. Mild scarring in the right kidney is noted as well. The bladder is partially distended. Stomach/Bowel: No obstructive or inflammatory changes are seen. The appendix is within normal limits. Vascular/Lymphatic: Aortic atherosclerosis. No enlarged abdominal or pelvic lymph nodes. Reproductive: The uterus is within normal limits and somewhat displaced to the right. Ovarian cystic changes are seen bilaterally. Other: No abdominal wall hernia or abnormality. No abdominopelvic ascites. Musculoskeletal: In the left ischial tuberosity and extending superiorly into the posterior aspect of the left acetabulum, there is a lytic lesion with significant soft tissue component. It nearly completely destroyed is the ischial tuberosity and posterior elements of the right acetabulum.  The soft tissue component extends inferiorly along the obturator foramen and measures approximately 12 by 8.6 cm in greatest dimension. It also extends superiorly along the left pelvic wall. IMPRESSION: Right middle lobe mass lesion with associated hilar and mediastinal adenopathy and near complete right middle lobe collapse. This is consistent with a primary pulmonary neoplasm with metastatic lesions till proven otherwise. PET-CT and tissue sampling are recommended for further evaluation. Small soft tissue nodule along the posterior costophrenic angle medially on the left. This may represent a small metastatic focus. Bony metastatic disease involving the ischial tuberosity on the left with large significant surrounding soft tissue component consistent with metastatic disease. Electronically Signed   By: Inez Catalina M.D.   On: 07/14/2016 18:10   [name] NL Contrast  Result Date: 07/18/2016 CLINICAL DATA:  56 year old female with lung cancer and MRI positive for brain metastases recently. Study for stereotactic radiosurgery planning. By report, the patient had an iron infusion yesterday. EXAM: MRI HEAD WITHOUT AND WITH CONTRAST TECHNIQUE: Multiplanar, multiecho pulse sequences of the brain and surrounding structures were obtained without and with intravenous contrast. CONTRAST:  80m MULTIHANCE GADOBENATE DIMEGLUMINE 529 MG/ML IV SOLN COMPARISON:  Brain MRI 07/16/2016. FINDINGS: Brain: Susceptibility artifact related to recent iron infusion, although not severe on T1 weighted imaging. The 6 enhancing brain metastases demonstrated on 07/16/2016 are stable in size and configuration, although their margins are indistinct today. The largest lesion in the left occipital lobe may have a small adjacent 3 mm metastasis as seen on series 11, image 67 today. No new brain metastasis is identified. Vasogenic edema associated with the lesions is unchanged, and maximal in the left occipital lobe. Mild regional mass effect  there, but otherwise no significant intracranial mass effect. No midline shift. Patent basilar cisterns. Abnormal T2 and FLAIR hyperintensity extending from the left cerebellar peduncle into the left lateral cerebellar hemisphere is unchanged and more resembles encephalomalacia. No dural or leptomeningeal enhancement is evident on these recent MRIs. No restricted diffusion or evidence of acute infarction. No acute intracranial hemorrhage is evident. T2* imaging is degraded. Vascular: Major intracranial vascular flow voids are stable. Skull and upper cervical spine: Stable bone marrow signal. Negative visualized cervical spine. Sinuses/Orbits: Stable and negative. Other: Mastoids remain clear.  Negative scalp soft tissues. IMPRESSION: 1. Susceptibility artifact related to iron infusion yesterday. The brain metastases identified on 07/16/2016 are stable, but their margins are less distinct due to the iron artifact. Note that there may be a small subcentimeter metastasis adjacent to the largest mass in the left occipital lobe (series 11, image 67). Stable vasogenic edema and mild intracranial mass  effect. 2. No new brain metastasis identified. 3. Stable signal abnormality along the left cerebellar peduncle and left lateral cerebellum which most resembles encephalomalacia. Electronically Signed   By: Genevie Ann M.D.   On: 07/18/2016 15:42   [name] BZ Contrast  Result Date: 07/16/2016 CLINICAL DATA:  Brain metastasis.  Metastatic lung cancer. EXAM: MRI HEAD WITHOUT AND WITH CONTRAST TECHNIQUE: Multiplanar, multiecho pulse sequences of the brain and surrounding structures were obtained without and with intravenous contrast. CONTRAST:  58m MULTIHANCE GADOBENATE DIMEGLUMINE 529 MG/ML IV SOLN COMPARISON:  CT head without contrast 08/22/2011 FINDINGS: Brain: Multiple enhancing brain lesions are compatible with metastatic disease. The largest lesion is in the left occipital lobe measuring at 2.0 x 1.7 x 1.9 cm. There is  surrounding vasogenic edema with effacement of the adjacent sulci. A 7 mm lesion is present anteriorly in the left frontal lobe on image 34 of series 11. Three additional 3 mm lesions are present. Lesions are present in the anterior inferior frontal lobes bilaterally on image 25 of series 11 and in the medial right parietal lobe on image 35 of series 11. A 4 mm lesion is present along the inferior left temporal lobe. Vascular: Flow is present in the major intracranial arteries. Skull and upper cervical spine: The skullbase is within normal limits. The upper cervical spine is unremarkable. Sinuses/Orbits: The paranasal sinuses can the mastoid air cells are clear. Bilateral globes and orbits are within normal limits. IMPRESSION: 1. Six enhancing lesions scattered throughout the brain compatible with metastatic disease. The largest lesion involves the left occipital lobe. 2. Surrounding vasogenic edema involves the 2 largest lesions with mass effect in the left occipital left lobe evidence by effacement of the sulci. Electronically Signed   By: CSan MorelleM.D.   On: 07/16/2016 18:55   Ct Abdomen Pelvis W Contrast  Result Date: 07/14/2016 CLINICAL DATA:  Right lung mass and right middle lobe collapse EXAM: CT CHEST, ABDOMEN, AND PELVIS WITH CONTRAST TECHNIQUE: Multidetector CT imaging of the chest, abdomen and pelvis was performed following the standard protocol during bolus administration of intravenous contrast. CONTRAST:  1048mISOVUE-300 IOPAMIDOL (ISOVUE-300) INJECTION 61% COMPARISON:  Chest x-ray from earlier in the same day. FINDINGS: CT CHEST FINDINGS Cardiovascular: The thoracic aorta shows a bovine it branching pattern. No aneurysmal dilatation or dissection is seen. No cardiac enlargement is noted. The pulmonary artery as visualized is within normal limits with the exception of attenuation of branches on the right secondary to hilar and mediastinal adenopathy. Mediastinum/Nodes: Thoracic inlet is  within normal limits. Multiple mediastinal mobile masses are seen. One is noted in the anterior mediastinum measuring 5.8 x 2.9 cm. A second is noted anterior to the carina measuring at least 5.1 by 5.8 cm. This mildly displaces the superior vena cava and causes mass effect upon the right main pulmonary artery. Subcarinal nodal mass is also noted measuring 4.5 by 3.4 cm in greatest dimension. Smaller right hilar nodes are seen. The largest of these measures 26 mm in short axis. The esophagus as visualize is within normal limits. Lungs/Pleura: The left lung demonstrates some emphysematous changes. No focal infiltrate or sizable effusion is seen. There is a 1.8 x 1.0 soft tissue density along the posteromedial aspect of the left lung. This appears to be within the chest wall as opposed to within the lung itself. The right lung also demonstrates significant emphysematous changes. There is a mass lesion which measures 2.8 by 3.0 cm in greatest transverse and AP dimension  within the right middle lobe as seen on previous chest x-ray. Near complete collapse of the right middle lobe is seen secondary to central bronchial occlusion. Musculoskeletal: Degenerative changes of the thoracic spine are noted. No definitive lytic or sclerotic lesions are seen. CT ABDOMEN PELVIS FINDINGS Hepatobiliary: The liver demonstrates a focal hyperdense lesion likely representing a small hemangioma. The gallbladder is within normal limits. Pancreas: Pancreas is within normal limits. Spleen: Normal in size without focal abnormality. Adrenals/Urinary Tract: The adrenal glands are unremarkable. The kidneys demonstrate normal enhancement bilaterally. Renal cyst is noted on the left. A few small nonobstructing calculi are noted in the right kidney. Mild scarring in the right kidney is noted as well. The bladder is partially distended. Stomach/Bowel: No obstructive or inflammatory changes are seen. The appendix is within normal limits.  Vascular/Lymphatic: Aortic atherosclerosis. No enlarged abdominal or pelvic lymph nodes. Reproductive: The uterus is within normal limits and somewhat displaced to the right. Ovarian cystic changes are seen bilaterally. Other: No abdominal wall hernia or abnormality. No abdominopelvic ascites. Musculoskeletal: In the left ischial tuberosity and extending superiorly into the posterior aspect of the left acetabulum, there is a lytic lesion with significant soft tissue component. It nearly completely destroyed is the ischial tuberosity and posterior elements of the right acetabulum. The soft tissue component extends inferiorly along the obturator foramen and measures approximately 12 by 8.6 cm in greatest dimension. It also extends superiorly along the left pelvic wall. IMPRESSION: Right middle lobe mass lesion with associated hilar and mediastinal adenopathy and near complete right middle lobe collapse. This is consistent with a primary pulmonary neoplasm with metastatic lesions till proven otherwise. PET-CT and tissue sampling are recommended for further evaluation. Small soft tissue nodule along the posterior costophrenic angle medially on the left. This may represent a small metastatic focus. Bony metastatic disease involving the ischial tuberosity on the left with large significant surrounding soft tissue component consistent with metastatic disease. Electronically Signed   By: Inez Catalina M.D.   On: 07/14/2016 18:10   Ct Biopsy  Result Date: 07/16/2016 CLINICAL DATA:  Right middle lobe mass lesion with regional adenopathy. Destructive left ischial mass with soft tissue component. EXAM: CT GUIDED CORE BIOPSY OF LEFT ISCHIAL MASS ANESTHESIA/SEDATION: Intravenous Fentanyl and Versed were administered as conscious sedation during continuous monitoring of the patient's level of consciousness and physiological / cardiorespiratory status by the radiology RN, with a total moderate sedation time of 10 minutes.  PROCEDURE: The procedure risks, benefits, and alternatives were explained to the patient. Questions regarding the procedure were encouraged and answered. The patient understands and consents to the procedure. patient placed prone. Select axial scans obtained through the pelvis. The left ischial mass was identified and an appropriate skin entry site determined and marked. The operative field was prepped with chlorhexidinein a sterile fashion, and a sterile drape was applied covering the operative field. A sterile gown and sterile gloves were used for the procedure. Local anesthesia was provided with 1% Lidocaine. Under CT fluoroscopic guidance, a 17 gauge trocar needle was advanced to the margin of the lesion. Once needle tip position was confirmed, coaxial 18-gauge core biopsy samples were obtained, submitted in formalin to surgical pathology. The guide needle was removed. Postprocedure scans show no hemorrhage or other apparent complication. The patient tolerated the procedure well. COMPLICATIONS: None immediate FINDINGS: Lytic left ischial mass with extraosseous soft tissue component was again localized. Representative core biopsy samples obtained as above. IMPRESSION: 1. Technically successful core biopsy, left ischial mass.  Electronically Signed   By: Lucrezia Europe M.D.   On: 07/16/2016 16:42    ASSESSMENT & PLAN:  Metastatic lung cancer (metastasis from lung to other site) [facility]) We discussed the role of chemotherapy. The intent is of palliative intent. I discussed the importance of repeat staging scan before we start treatment I have tentatively request a PET CT scan to be done after radiation treatment has been completed We reviewed the current guidelines Based on available histology data, she would benefit from combination chemotherapy I recommend combination treatment with carboplatin and pemetrexed Unless Foundation One testing came back positive for actionable mutation, this would be the plan of  care PD1 testing recently came back negative  We discussed some of the risks, benefits, side-effects of carboplatin and Alimta  Some of the short term side-effects included, though not limited to, including weight loss, life threatening infections, risk of allergic reactions, need for transfusions of blood products, nausea, vomiting, change in bowel habits, loss of hair, admission to hospital for various reasons, and risks of death.   Long term side-effects are also discussed including risks of infertility, permanent damage to nerve function, hearing loss, chronic fatigue, kidney damage with possibility needing hemodialysis, and rare secondary malignancy including bone marrow disorders.  The patient is aware that the response rates discussed earlier is not guaranteed.  After a long discussion, patient made an informed decision to proceed with the prescribed plan of care.   Patient education material was dispensed. I will schedule chemo education class I recommend port placement before treatment I will see her back prior to cycle 1 of treatment In preparation for treatment with Alimta, I will schedule vitamin B12 injection this week    B12 deficiency anemia I will schedule B12 injections in preparation for treatment for Alimta  Cancer associated pain She has well-controlled pain with current prescription pain medicine I reminded her to check her prescription weekly We discussed narcotic refill policy  Metastasis to bone [facility]) She is doing well with palliative radiation therapy to her bone  Metastasis to brain [facility]) She will be completing radiation therapy soon I would defer to radiation oncologist for plan for steroid taper  Goals of care, counseling/discussion The patient is aware she has incurable disease and treatment is strictly palliative. We discussed importance of Advanced Directives and Living will. We discussed CODE STATUS; the patient desires to remain in full  code.   Orders Placed This Encounter  Procedures  . NM PET Image Initial (PI) Skull Base To Thigh    Standing Status:   Future    Standing Expiration Date:   09/29/2017    Order Specific Question:   Reason for Exam (SYMPTOM  OR DIAGNOSIS REQUIRED)    Answer:   staging  lung ca    Order Specific Question:   Is the patient pregnant?    Answer:   No    Order Specific Question:   Preferred imaging location?    Answer:   [facility]  . IR FLUORO GUIDE PORT INSERTION RIGHT    Standing Status:   Future    Standing Expiration Date:   09/29/2017    Order Specific Question:   Reason for Exam (SYMPTOM  OR DIAGNOSIS REQUIRED)    Answer:   need port for chemo    Order Specific Question:   Preferred Imaging Location?    Answer:   [facility]  . CBC with Differential/Platelet    Standing Status:   Standing  Number of Occurrences:   22    Standing Expiration Date:   07/31/2017  . Comprehensive metabolic panel    Standing Status:   Standing    Number of Occurrences:   22    Standing Expiration Date:   07/31/2017  . Uric acid    Standing Status:   Standing    Number of Occurrences:   2    Standing Expiration Date:   07/31/2017   All questions were answered. The patient knows to call the clinic with any problems, questions or concerns. No barriers to learning was detected. I spent 60 minutes counseling the patient face to face. The total time spent in the appointment was 95 minutes and more than 50% was on counseling and review of test results     Heath Lark, MD 07/31/2016 5:55 PM

## 2016-07-31 NOTE — Assessment & Plan Note (Signed)
I will schedule B12 injections in preparation for treatment for Alimta

## 2016-07-31 NOTE — Assessment & Plan Note (Signed)
She is doing well with palliative radiation therapy to her bone

## 2016-07-31 NOTE — Assessment & Plan Note (Signed)
She has well-controlled pain with current prescription pain medicine I reminded her to check her prescription weekly We discussed narcotic refill policy

## 2016-07-31 NOTE — Assessment & Plan Note (Signed)
The patient is aware she has incurable disease and treatment is strictly palliative. We discussed importance of Advanced Directives and Living will. We discussed CODE STATUS; the patient desires to remain in full code.

## 2016-07-31 NOTE — Assessment & Plan Note (Signed)
We discussed the role of chemotherapy. The intent is of palliative intent. I discussed the importance of repeat staging scan before we start treatment I have tentatively request a PET CT scan to be done after radiation treatment has been completed We reviewed the current guidelines Based on available histology data, she would benefit from combination chemotherapy I recommend combination treatment with carboplatin and pemetrexed Unless Foundation One testing came back positive for actionable mutation, this would be the plan of care PD1 testing recently came back negative  We discussed some of the risks, benefits, side-effects of carboplatin and Alimta  Some of the short term side-effects included, though not limited to, including weight loss, life threatening infections, risk of allergic reactions, need for transfusions of blood products, nausea, vomiting, change in bowel habits, loss of hair, admission to hospital for various reasons, and risks of death.   Long term side-effects are also discussed including risks of infertility, permanent damage to nerve function, hearing loss, chronic fatigue, kidney damage with possibility needing hemodialysis, and rare secondary malignancy including bone marrow disorders.  The patient is aware that the response rates discussed earlier is not guaranteed.  After a long discussion, patient made an informed decision to proceed with the prescribed plan of care.   Patient education material was dispensed. I will schedule chemo education class I recommend port placement before treatment I will see her back prior to cycle 1 of treatment In preparation for treatment with Alimta, I will schedule vitamin B12 injection this week

## 2016-08-01 ENCOUNTER — Encounter (HOSPITAL_COMMUNITY): Payer: Self-pay

## 2016-08-01 DIAGNOSIS — Z510 Encounter for antineoplastic radiation therapy: Secondary | ICD-10-CM | POA: Diagnosis not present

## 2016-08-02 ENCOUNTER — Telehealth: Payer: Self-pay | Admitting: Hematology and Oncology

## 2016-08-02 ENCOUNTER — Telehealth: Payer: Self-pay | Admitting: Radiation Therapy

## 2016-08-02 ENCOUNTER — Telehealth: Payer: Self-pay

## 2016-08-02 ENCOUNTER — Other Ambulatory Visit: Payer: Self-pay | Admitting: Hematology and Oncology

## 2016-08-02 DIAGNOSIS — C3490 Malignant neoplasm of unspecified part of unspecified bronchus or lung: Secondary | ICD-10-CM

## 2016-08-02 DIAGNOSIS — G893 Neoplasm related pain (acute) (chronic): Secondary | ICD-10-CM

## 2016-08-02 MED ORDER — MORPHINE SULFATE ER 15 MG PO TBCR: 15.0000 mg | EXTENDED_RELEASE_TABLET | Freq: Two times a day (BID) | ORAL | 0 refills | Status: DC

## 2016-08-02 NOTE — Telephone Encounter (Signed)
Heather Hinton called requesting a refill on her morphine (MS CONTIN) 15 MG 12 hr tablet written by Dr. Alvy Bimler. She stated that she has enough for today and tomorrow, but will need the refill before the weekend. I have called Dr. Calton Dach nurse line on her behalf requesting that they call her back about this issue. Heather Hinton will be here for her [place] brain treatment tomorrow afternoon, 6/22 @ 3:00, if she needs to pick up the written Rx at that time.  Heather Hinton R.T.(R)(T) Special Procedures Navigator

## 2016-08-02 NOTE — Telephone Encounter (Signed)
R/s inj appt for 6/22 per Manuela Schwartz in Rad onc for radiation treatment. Per susan she will contact patient and let patient know of the appt change.

## 2016-08-02 NOTE — Telephone Encounter (Signed)
Called patient back, received message from Manuela Schwartz in Radiation that patient needs refill on Morphine. Called patient back she states that she got a Rx and got it refilled, but they gave her a partial refill of 20, and will not give her the rest of the Rx. Told patient she can pick up Rx tomorrow, and from now on to [place] the outpatient  pharmacy at [facility].

## 2016-08-03 ENCOUNTER — Encounter: Payer: Self-pay | Admitting: Radiation Oncology

## 2016-08-03 ENCOUNTER — Ambulatory Visit
Admission: RE | Admit: 2016-08-03 | Discharge: 2016-08-03 | Disposition: A | Discharge status: Home / Self Care | Payer: Medicaid Other | Source: Ambulatory Visit | Attending: Radiation Oncology | Admitting: Radiation Oncology

## 2016-08-03 ENCOUNTER — Ambulatory Visit (HOSPITAL_BASED_OUTPATIENT_CLINIC_OR_DEPARTMENT_OTHER): Payer: Medicaid Other

## 2016-08-03 ENCOUNTER — Ambulatory Visit: Payer: Medicaid Other

## 2016-08-03 VITALS — BP 146/79 | HR 110 | Temp 98.1°F | Resp 16

## 2016-08-03 VITALS — BP 134/70 | HR 98 | Temp 97.8°F | Resp 18

## 2016-08-03 DIAGNOSIS — Z510 Encounter for antineoplastic radiation therapy: Secondary | ICD-10-CM | POA: Diagnosis not present

## 2016-08-03 DIAGNOSIS — D649 Anemia, unspecified: Secondary | ICD-10-CM

## 2016-08-03 DIAGNOSIS — D519 Vitamin B12 deficiency anemia, unspecified: Secondary | ICD-10-CM

## 2016-08-03 DIAGNOSIS — C3490 Malignant neoplasm of unspecified part of unspecified bronchus or lung: Secondary | ICD-10-CM | POA: Diagnosis not present

## 2016-08-03 DIAGNOSIS — C7931 Secondary malignant neoplasm of brain: Secondary | ICD-10-CM

## 2016-08-03 MED ORDER — CYANOCOBALAMIN 1000 MCG/ML IJ SOLN
1000.0000 ug | Freq: Once | INTRAMUSCULAR | Status: AC
  Administered 2016-08-03: 1000 ug via INTRAMUSCULAR

## 2016-08-03 NOTE — Progress Notes (Signed)
  Radiation Oncology         [phone]) [phone] ________________________________  Stereotactic Treatment Procedure Note  Name: Heather Hinton MRN: [id number]  Date: 08/03/2016  DOB: 03-22-1960  SPECIAL TREATMENT PROCEDURE    ICD-10-CM   1. Metastasis to brain [facility]) C79.31     3D TREATMENT PLANNING AND DOSIMETRY:  The patient's radiation plan was reviewed and approved by neurosurgery and radiation oncology prior to treatment.  It showed 3-dimensional radiation distributions overlaid onto the planning CT/MRI image set.  [facility] for the target structures as well as the organs at risk were reviewed. The documentation of the 3D plan and dosimetry are filed in the radiation oncology EMR.  NARRATIVE:  Heather Hinton was brought to the TrueBeam stereotactic radiation treatment machine and placed supine on the CT couch. The head frame was applied, and the patient was set up for stereotactic radiosurgery.  Neurosurgery was present for the set-up and delivery  SIMULATION VERIFICATION:  In the couch zero-angle position, the patient underwent Exactrac imaging using the Brainlab system with orthogonal KV images.  These were carefully aligned and repeated to confirm treatment position for each of the isocenters.  The Exactrac snap film verification was repeated at each couch angle.  PROCEDURE: Heather Hinton received stereotactic radiosurgery to the following targets: The patient had 7 individual metastases was treated using 6 Rapid Arc VMAT Beams to a prescription dose of 18-20 Gy.  ExacTrac registration was performed for each couch angle.  The 100% isodose line was prescribed.  6 MV X-rays were delivered in the flattening filter free beam mode.   STEREOTACTIC TREATMENT MANAGEMENT:  Following delivery, the patient was transported to nursing in stable condition and monitored for possible acute effects.  Vital signs were recorded BP (!) 146/79 (BP Location: Right Arm, Patient Position: Sitting, Cuff Size: Normal)    Pulse (!) 110   Temp 98.1 F (36.7 C) (Oral)   Resp 16   SpO2 100% . The patient tolerated treatment without significant acute effects, and was discharged to home in stable condition.    PLAN: Follow-up in one month.  ________________________________  Sheral Apley. Tammi Klippel, M.D.

## 2016-08-03 NOTE — Op Note (Signed)
  Name: Heather Hinton  MRN: [id number]  Date: 08/03/2016   DOB: April 11, 1960  Stereotactic Radiosurgery Operative Note  PRE-OPERATIVE DIAGNOSIS:  Multiple Brain Metastases  POST-OPERATIVE DIAGNOSIS:  Multiple Brain Metastases  PROCEDURE:  Stereotactic Radiosurgery  SURGEON:  Peggyann Shoals, MD  NARRATIVE: The patient underwent a radiation treatment planning session in the radiation oncology simulation suite under the care of the radiation oncology physician and physicist.  I participated closely in the radiation treatment planning afterwards. The patient underwent planning CT which was fused to 3T high resolution MRI with 1 mm axial slices.  These images were fused on the planning system.  We contoured the gross target volumes and subsequently expanded this to yield the Planning Target Volume. I actively participated in the planning process.  I helped to define and review the target contours and also the contours of the optic pathway, eyes, brainstem and selected nearby organs at risk.  All the dose constraints for critical structures were reviewed and compared to AAPM Task Group 101.  The prescription dose conformity was reviewed.  I approved the plan electronically.    Accordingly, Heather Hinton was brought to the TrueBeam stereotactic radiation treatment linac and placed in the custom immobilization mask.  The patient was aligned according to the IR fiducial markers with BrainLab Exactrac, then orthogonal x-rays were used in ExacTrac with the 6DOF robotic table and the shifts were made to align the patient  Heather Hinton received stereotactic radiosurgery uneventfully.    Lesions treated:  7   Complex lesions treated:  0 (>3.5 cm, <35mm of optic path, or within the brainstem)   The detailed description of the procedure is recorded in the radiation oncology procedure note.  I was present for the duration of the procedure.  DISPOSITION:  Following delivery, the patient was transported to  nursing in stable condition and monitored for possible acute effects to be discharged to home in stable condition with follow-up in one month.  Peggyann Shoals, MD 08/03/2016 5:21 PM

## 2016-08-03 NOTE — Progress Notes (Signed)
Nurse monitoring complete following srs treatment. Patient given one month follow up appointment card by Trudee Kuster, RT. Patient denies headache, dizziness, nausea, diplopia, or ringing in the ears. Instructed patient to call on call provider with any needs at [phone]. Instructed patient to avoid strenuous activity for the next 24 hours. Patient verbalized understanding. Patient discharged ambulatory using a rolling walker with family.

## 2016-08-03 NOTE — Patient Instructions (Signed)
Cyanocobalamin, Vitamin B12 injection What is this medicine? CYANOCOBALAMIN (sye an oh koe BAL a min) is a man made form of vitamin B12. Vitamin B12 is used in the growth of healthy blood cells, nerve cells, and proteins in the body. It also helps with the metabolism of fats and carbohydrates. This medicine is used to treat people who can not absorb vitamin B12. This medicine may be used for other purposes; ask your health care provider or pharmacist if you have questions. COMMON BRAND NAME(S): B-12 Compliance Kit, B-12 Injection Kit, Cyomin, LA-12, Nutri-Twelve, Physicians EZ Use B-12, Primabalt What should I tell my health care provider before I take this medicine? They need to know if you have any of these conditions: -kidney disease -Leber's disease -megaloblastic anemia -an unusual or allergic reaction to cyanocobalamin, cobalt, other medicines, foods, dyes, or preservatives -pregnant or trying to get pregnant -breast-feeding How should I use this medicine? This medicine is injected into a muscle or deeply under the skin. It is usually given by a health care professional in a clinic or doctor's office. However, your doctor may teach you how to inject yourself. Follow all instructions. Talk to your pediatrician regarding the use of this medicine in children. Special care may be needed. Overdosage: If you think you have taken too much of this medicine contact a poison control center or emergency room at once. NOTE: This medicine is only for you. Do not share this medicine with others. What if I miss a dose? If you are given your dose at a clinic or doctor's office, call to reschedule your appointment. If you give your own injections and you miss a dose, take it as soon as you can. If it is almost time for your next dose, take only that dose. Do not take double or extra doses. What may interact with this medicine? -colchicine -heavy alcohol intake This list may not describe all possible  interactions. Give your health care provider a list of all the medicines, herbs, non-prescription drugs, or dietary supplements you use. Also tell them if you smoke, drink alcohol, or use illegal drugs. Some items may interact with your medicine. What should I watch for while using this medicine? Visit your doctor or health care professional regularly. You may need blood work done while you are taking this medicine. You may need to follow a special diet. Talk to your doctor. Limit your alcohol intake and avoid smoking to get the best benefit. What side effects may I notice from receiving this medicine? Side effects that you should report to your doctor or health care professional as soon as possible: -allergic reactions like skin rash, itching or hives, swelling of the face, lips, or tongue -blue tint to skin -chest tightness, pain -difficulty breathing, wheezing -dizziness -red, swollen painful area on the leg Side effects that usually do not require medical attention (report to your doctor or health care professional if they continue or are bothersome): -diarrhea -headache This list may not describe all possible side effects. Call your doctor for medical advice about side effects. You may report side effects to FDA at 1-800-FDA-1088. Where should I keep my medicine? Keep out of the reach of children. Store at room temperature between 15 and 30 degrees C (59 and 85 degrees F). Protect from light. Throw away any unused medicine after the expiration date. NOTE: This sheet is a summary. It may not cover all possible information. If you have questions about this medicine, talk to your doctor, pharmacist, or   health care provider.  2018 Elsevier/Gold Standard (2007-05-12 22:10:20)  

## 2016-08-06 ENCOUNTER — Telehealth: Payer: Self-pay | Admitting: Radiation Oncology

## 2016-08-06 ENCOUNTER — Encounter (HOSPITAL_COMMUNITY): Payer: Self-pay

## 2016-08-06 NOTE — Progress Notes (Signed)
  Radiation Oncology         [phone]) [phone] ________________________________  Name: Heather Hinton MRN: [id number]  Date: 08/03/2016  DOB: 1960/07/22  End of Treatment Note  Diagnosis:   56 y.o. 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 After SRS, the patient denied headaches, dizziness, nausea, diplopia, or ringing in the ears. The patient denied significant left hip pain.  Plan: The patient has completed radiation treatment. The patient will return to radiation oncology clinic for routine followup in one month. I advised her to call or return sooner if she has any questions or concerns related to her recovery or treatment. ________________________________  Sheral Apley. Tammi Klippel, M.D.   This document serves as a record of services personally performed by Tyler Pita, MD. It was created on his behalf by Rae Lips, a trained medical scribe. The creation of this record is based on the scribe's personal observations and the provider's statements to them. This document has been checked and approved by the attending provider.

## 2016-08-06 NOTE — Telephone Encounter (Signed)
Phoned patient to inquire about status since receiving SRS treatment on Friday. No answer. Left message. Also, wish to inquire about decadron and need for taper. Awaiting return call.

## 2016-08-06 NOTE — Telephone Encounter (Signed)
Since she had one large tumor, I suspect she will need a slow taper.  I think it may be OK to move to 4 mg q12 hours on 6/26 for 2 weeks, then 2 mg TID for one week, then 2 mg BID for one week, then 2 mg daily for one week, then stop.

## 2016-08-06 NOTE — Telephone Encounter (Signed)
Phoned patient again. Patient reports she feels well following her [place] treatment. Patient reports taking decadron 4 mg every eight hours. Patient understands this RN will phone her back tomorrow and advise if/when she should taper.

## 2016-08-07 ENCOUNTER — Telehealth: Payer: Self-pay | Admitting: Radiation Oncology

## 2016-08-07 ENCOUNTER — Encounter: Payer: Self-pay | Admitting: *Deleted

## 2016-08-07 NOTE — Telephone Encounter (Signed)
Opened in error

## 2016-08-07 NOTE — Telephone Encounter (Signed)
Per Dr. Johny Shears order I phoned the patient with her decadron taper instructions. Advised her of the following: Decadron 4 mg q12 hours on 6/26 for 2 weeks, then 2 mg TID for one week, then 2 mg BID for one week, then 2 mg daily for one week, then stop. Patient wrote these directions down and read them back to this RN correctly. Patient confirms she will be at [facility] tomorrow. Encouraged her to pick up calendar I made for her detailing decadron taper and she committed to doing so.

## 2016-08-08 ENCOUNTER — Other Ambulatory Visit: Payer: Medicaid Other

## 2016-08-08 ENCOUNTER — Encounter: Payer: Self-pay | Admitting: *Deleted

## 2016-08-13 ENCOUNTER — Telehealth: Payer: Self-pay | Admitting: *Deleted

## 2016-08-13 NOTE — Telephone Encounter (Signed)
FYI "I feel heart burn or ache with every first bite of food I've tried to eat the past few days.  Happening with every first bite of food, every time I want to eat.  Goes away as I eat it.  Never had this happen before.  Is it something I can take for this?"  Reports not eating taking dexamethasone.  Will notify provider.  Nurse instructions to eat meal or small snack when taking dexamethasone.  Answered questions of how to take dexamethasone with first chemotherapy scheduled on July 10th.  No further questions.

## 2016-08-17 ENCOUNTER — Other Ambulatory Visit: Payer: Self-pay | Admitting: Student

## 2016-08-17 ENCOUNTER — Encounter: Payer: Self-pay | Admitting: Pharmacist

## 2016-08-17 MED ORDER — PANTOPRAZOLE SODIUM 40 MG PO TBEC: 40.0000 mg | DELAYED_RELEASE_TABLET | Freq: Every day | ORAL | 1 refills | Status: AC

## 2016-08-17 NOTE — Telephone Encounter (Signed)
Please call in protonix 40 mg daily to pharmacy of her choice

## 2016-08-17 NOTE — Telephone Encounter (Signed)
Notified of message below. Verbalized understanding 

## 2016-08-20 ENCOUNTER — Encounter (HOSPITAL_COMMUNITY): Payer: Self-pay | Admitting: Emergency Medicine

## 2016-08-20 ENCOUNTER — Other Ambulatory Visit: Payer: Self-pay

## 2016-08-20 ENCOUNTER — Ambulatory Visit (HOSPITAL_COMMUNITY)
Admission: RE | Admit: 2016-08-20 | Discharge: 2016-08-20 | Disposition: A | Discharge status: Home / Self Care | Payer: Medicaid Other | Source: Ambulatory Visit | Attending: Hematology and Oncology | Admitting: Hematology and Oncology

## 2016-08-20 ENCOUNTER — Other Ambulatory Visit: Payer: Self-pay | Admitting: Hematology and Oncology

## 2016-08-20 ENCOUNTER — Emergency Department (HOSPITAL_COMMUNITY): Payer: Medicaid Other

## 2016-08-20 ENCOUNTER — Emergency Department (HOSPITAL_COMMUNITY)
Admission: EM | Admit: 2016-08-20 | Discharge: 2016-08-21 | Disposition: A | Discharge status: Home / Self Care | Payer: Medicaid Other | Attending: Emergency Medicine | Admitting: Emergency Medicine

## 2016-08-20 ENCOUNTER — Encounter (HOSPITAL_COMMUNITY): Payer: Self-pay

## 2016-08-20 DIAGNOSIS — R509 Fever, unspecified: Secondary | ICD-10-CM | POA: Insufficient documentation

## 2016-08-20 DIAGNOSIS — C7951 Secondary malignant neoplasm of bone: Secondary | ICD-10-CM | POA: Insufficient documentation

## 2016-08-20 DIAGNOSIS — K5900 Constipation, unspecified: Secondary | ICD-10-CM | POA: Diagnosis not present

## 2016-08-20 DIAGNOSIS — C7931 Secondary malignant neoplasm of brain: Secondary | ICD-10-CM | POA: Insufficient documentation

## 2016-08-20 DIAGNOSIS — Z87891 Personal history of nicotine dependence: Secondary | ICD-10-CM | POA: Insufficient documentation

## 2016-08-20 DIAGNOSIS — C3490 Malignant neoplasm of unspecified part of unspecified bronchus or lung: Secondary | ICD-10-CM | POA: Insufficient documentation

## 2016-08-20 DIAGNOSIS — Z923 Personal history of irradiation: Secondary | ICD-10-CM | POA: Diagnosis not present

## 2016-08-20 DIAGNOSIS — Z79899 Other long term (current) drug therapy: Secondary | ICD-10-CM | POA: Diagnosis not present

## 2016-08-20 DIAGNOSIS — C799 Secondary malignant neoplasm of unspecified site: Secondary | ICD-10-CM | POA: Insufficient documentation

## 2016-08-20 DIAGNOSIS — I10 Essential (primary) hypertension: Secondary | ICD-10-CM | POA: Insufficient documentation

## 2016-08-20 HISTORY — PX: IR US GUIDE VASC ACCESS RIGHT: IMG2390

## 2016-08-20 HISTORY — DX: Malignant neoplasm of unspecified part of unspecified bronchus or lung: C34.90

## 2016-08-20 HISTORY — PX: IR FLUORO GUIDE PORT INSERTION RIGHT: IMG5741

## 2016-08-20 LAB — CBC WITH DIFFERENTIAL/PLATELET
Basophils Absolute: 0 10*3/uL (ref 0.0–0.1)
Basophils Relative: 0 %
EOS ABS: 0 10*3/uL (ref 0.0–0.7)
Eosinophils Relative: 0 %
HEMATOCRIT: 29.4 % — AB (ref 36.0–46.0)
HEMOGLOBIN: 9 g/dL — AB (ref 12.0–15.0)
LYMPHS ABS: 1.1 10*3/uL (ref 0.7–4.0)
LYMPHS PCT: 8 %
MCH: 25.4 pg — AB (ref 26.0–34.0)
MCHC: 30.6 g/dL (ref 30.0–36.0)
MCV: 83.1 fL (ref 78.0–100.0)
MONOS PCT: 5 %
Monocytes Absolute: 0.7 10*3/uL (ref 0.1–1.0)
NEUTROS ABS: 11.7 10*3/uL — AB (ref 1.7–7.7)
NEUTROS PCT: 87 %
Platelets: 145 10*3/uL — ABNORMAL LOW (ref 150–400)
RBC: 3.54 MIL/uL — AB (ref 3.87–5.11)
RDW: 21.8 % — ABNORMAL HIGH (ref 11.5–15.5)
WBC: 13.5 10*3/uL — ABNORMAL HIGH (ref 4.0–10.5)

## 2016-08-20 LAB — COMPREHENSIVE METABOLIC PANEL
ALT: 25 U/L (ref 14–54)
AST: 20 U/L (ref 15–41)
Albumin: 2.4 g/dL — ABNORMAL LOW (ref 3.5–5.0)
Alkaline Phosphatase: 177 U/L — ABNORMAL HIGH (ref 38–126)
Anion gap: 10 (ref 5–15)
BUN: 19 mg/dL (ref 6–20)
CHLORIDE: 99 mmol/L — AB (ref 101–111)
CO2: 24 mmol/L (ref 22–32)
CREATININE: 0.64 mg/dL (ref 0.44–1.00)
Calcium: 8.7 mg/dL — ABNORMAL LOW (ref 8.9–10.3)
GFR calc non Af Amer: >60 mL/min (ref >60)
Glucose, Bld: 101 mg/dL — ABNORMAL HIGH (ref 65–99)
POTASSIUM: 3.5 mmol/L (ref 3.5–5.1)
SODIUM: 133 mmol/L — AB (ref 135–145)
Total Bilirubin: 0.4 mg/dL (ref 0.3–1.2)
Total Protein: 6.1 g/dL — ABNORMAL LOW (ref 6.5–8.1)

## 2016-08-20 LAB — BASIC METABOLIC PANEL
ANION GAP: 12 (ref 5–15)
BUN: 20 mg/dL (ref 6–20)
CALCIUM: 9.5 mg/dL (ref 8.9–10.3)
CO2: 28 mmol/L (ref 22–32)
CREATININE: 0.63 mg/dL (ref 0.44–1.00)
Chloride: 99 mmol/L — ABNORMAL LOW (ref 101–111)
GFR calc non Af Amer: >60 mL/min (ref >60)
Glucose, Bld: 93 mg/dL (ref 65–99)
Potassium: 4 mmol/L (ref 3.5–5.1)
SODIUM: 139 mmol/L (ref 135–145)

## 2016-08-20 LAB — CBC
HCT: 30.8 % — ABNORMAL LOW (ref 36.0–46.0)
HEMOGLOBIN: 9.7 g/dL — AB (ref 12.0–15.0)
MCH: 26.4 pg (ref 26.0–34.0)
MCHC: 31.5 g/dL (ref 30.0–36.0)
MCV: 83.9 fL (ref 78.0–100.0)
PLATELETS: 152 10*3/uL (ref 150–400)
RBC: 3.67 MIL/uL — AB (ref 3.87–5.11)
RDW: 22 % — ABNORMAL HIGH (ref 11.5–15.5)
WBC: 13.8 10*3/uL — AB (ref 4.0–10.5)

## 2016-08-20 LAB — PROTIME-INR
INR: 1.05
PROTHROMBIN TIME: 13.7 s (ref 11.4–15.2)

## 2016-08-20 LAB — I-STAT CG4 LACTIC ACID, ED: Lactic Acid, Venous: 1.02 mmol/L (ref 0.5–1.9)

## 2016-08-20 MED ORDER — CEFAZOLIN SODIUM-DEXTROSE 2-4 GM/100ML-% IV SOLN
2.0000 g | Freq: Once | INTRAVENOUS | Status: AC
  Administered 2016-08-20: 2 g via INTRAVENOUS

## 2016-08-20 MED ORDER — LIDOCAINE HCL 1 % IJ SOLN
INTRAMUSCULAR | Status: AC
  Filled 2016-08-20: qty 20

## 2016-08-20 MED ORDER — LIDOCAINE HCL 1 % IJ SOLN
INTRAMUSCULAR | Status: AC | PRN
  Administered 2016-08-20: 10 mL

## 2016-08-20 MED ORDER — SODIUM CHLORIDE 0.9 % IV BOLUS (SEPSIS): 1000.0000 mL | Freq: Once | INTRAVENOUS | Status: DC

## 2016-08-20 MED ORDER — FENTANYL CITRATE (PF) 100 MCG/2ML IJ SOLN
INTRAMUSCULAR | Status: AC
  Filled 2016-08-20: qty 2

## 2016-08-20 MED ORDER — MIDAZOLAM HCL 2 MG/2ML IJ SOLN
INTRAMUSCULAR | Status: AC | PRN
  Administered 2016-08-20 (×3): 1 mg via INTRAVENOUS

## 2016-08-20 MED ORDER — HEPARIN SOD (PORK) LOCK FLUSH 100 UNIT/ML IV SOLN
INTRAVENOUS | Status: AC
  Administered 2016-08-20: 14:00:00
  Filled 2016-08-20: qty 5

## 2016-08-20 MED ORDER — SODIUM CHLORIDE 0.9 % IV SOLN
Freq: Once | INTRAVENOUS | Status: AC
  Administered 2016-08-20: via INTRAVENOUS

## 2016-08-20 MED ORDER — FENTANYL CITRATE (PF) 100 MCG/2ML IJ SOLN
INTRAMUSCULAR | Status: AC | PRN
  Administered 2016-08-20: 50 ug via INTRAVENOUS

## 2016-08-20 MED ORDER — MIDAZOLAM HCL 2 MG/2ML IJ SOLN
INTRAMUSCULAR | Status: AC
  Filled 2016-08-20: qty 2

## 2016-08-20 MED ORDER — LIDOCAINE-EPINEPHRINE (PF) 2 %-1:200000 IJ SOLN
INTRAMUSCULAR | Status: AC
  Filled 2016-08-20: qty 20

## 2016-08-20 MED ORDER — SODIUM CHLORIDE 0.9 % IV SOLN
INTRAVENOUS | Status: DC
  Administered 2016-08-20: 11:00:00 via INTRAVENOUS

## 2016-08-20 MED ORDER — LIDOCAINE-EPINEPHRINE (PF) 2 %-1:200000 IJ SOLN
INTRAMUSCULAR | Status: AC | PRN
  Administered 2016-08-20: 10 mL

## 2016-08-20 MED ORDER — CEFAZOLIN SODIUM-DEXTROSE 2-4 GM/100ML-% IV SOLN
INTRAVENOUS | Status: AC
  Filled 2016-08-20: qty 100

## 2016-08-20 NOTE — H&P (Signed)
Referring Physician(s): Heath Lark  Supervising Physician: Daryll Brod  Patient Status:  [facility] OP  Chief Complaint:  "I'm getting a port a cath"  Subjective: Patient familiar to IR service from prior left ischial  mass biopsy on 07/16/16. She has a history of lung cancer with metastasis to the brain and bones and presents today for Port-A-Cath placement for palliative chemotherapy. She has completed radiation therapy. She currently denies fever, headache, chest pain, dyspnea, cough, back pain, nausea, vomiting or abnormal bleeding. She does have mild intermittent left lower quadrant discomfort and constipation. Past Medical History:  Diagnosis Date  . Hypertension   . Spinal stenosis    Past Surgical History:  Procedure Laterality Date  . TUBAL LIGATION        Allergies: Iron  Medications: Prior to Admission medications   Medication Sig Start Date End Date Taking? Authorizing Provider  amLODipine (NORVASC) 10 MG tablet Take 10 mg by mouth at bedtime.   Yes [provider]  HYDROcodone-acetaminophen (NORCO/VICODIN) 5-325 MG tablet Take 1-2 tablets by mouth every 4 (four) hours as needed for moderate pain or severe pain. 07/23/16  Yes Theodis Blaze, MD  lovastatin (MEVACOR) 20 MG tablet Take 20 mg by mouth at bedtime.   Yes [provider]  morphine (MS CONTIN) 15 MG 12 hr tablet Take 1 tablet (15 mg total) by mouth every 12 (twelve) hours. 08/02/16  Yes Gorsuch, Ni, MD  polyethylene glycol (MIRALAX / GLYCOLAX) packet Take 17 g by mouth daily. 07/24/16  Yes Theodis Blaze, MD  dexamethasone (DECADRON) 4 MG tablet Take 1 tablet (4 mg total) by mouth every 8 (eight) hours. 07/23/16   Theodis Blaze, MD  gabapentin (NEURONTIN) 300 MG capsule Take 300 mg by mouth 2 (two) times daily.    [provider]  ibuprofen (ADVIL,MOTRIN) 800 MG tablet     [provider]  lidocaine-prilocaine (EMLA) cream Apply 1 application topically as needed. 07/30/16    Heath Lark, MD  Multiple Vitamin (MULTIVITAMIN WITH MINERALS) TABS tablet Take 1 tablet by mouth daily.    [provider]  pantoprazole (PROTONIX) 40 MG tablet Take 1 tablet (40 mg total) by mouth daily. 08/17/16   Heath Lark, MD  senna-docusate (SENOKOT-S) 8.6-50 MG tablet Take 2 tablets by mouth 2 (two) times daily. 07/23/16   Theodis Blaze, MD     Vital Signs: BP 124/77 (BP Location: Right Arm)   Pulse (!) 109   Temp 98.2 F (36.8 C) (Oral)   Resp 16   SpO2 100%   Physical Exam awake, alert. Chest clear to auscultation bilaterally. Heart with tachycardic but regular rhythm. Abdomen soft, positive bowel sounds, minimal left lower quadrant tenderness to palpation. No lower extremity edema.  Imaging: No results found.  Labs:  CBC:  Recent Labs  07/21/16 0519 07/22/16 0423 07/23/16 0435 07/24/16 0424  WBC 41.9* 37.6* 35.7* 36.4*  HGB 10.8* 11.2* 11.2* 12.2  HCT 35.1* 35.3* 35.4* 39.2  PLT 635* 606* 602* 584*    COAGS:  Recent Labs  07/16/16 0440  INR 1.19    BMP:  Recent Labs  07/21/16 0519 07/22/16 0423 07/23/16 0435 07/24/16 0424  NA 140 138 137 138  K 5.0 4.5 4.6 4.4  CL 107 104 106 101  CO2 24 24 22 26   GLUCOSE 133* 131* 130* 132*  BUN 21* 20 21* 24*  CALCIUM 9.3 9.2 9.2 9.8  CREATININE 0.73 0.67 0.56 0.67  GFRNONAA >60 >60 >60 >60  GFRAA >60 >60 >60 >60    LIVER FUNCTION TESTS:  Recent Labs  04/15/16 0429 07/14/16 1612 07/16/16 0440  BILITOT 0.5 0.6 0.6  AST 24 14* 10*  ALT 17 11* 11*  ALKPHOS 75 174* 138*  PROT 7.5 8.1 7.1  ALBUMIN 3.3* 2.5* 2.3*    Assessment and Plan: Pt with history of lung cancer with metastatic disease to brain/bones;  presents today for Port-A-Cath placement for palliative chemotherapy. She has completed radiation therapy.Risks and benefits discussed with the patient/family including, but not limited to bleeding, infection, pneumothorax, or fibrin sheath development and need for additional  procedures.All of the patient's questions were answered, patient is agreeable to proceed.Consent signed and in chart.Labs pend.     Electronically Signed: D. Rowe Robert, PA-C 08/20/2016, 10:33 AM   I spent a total of 20 minutes at the the patient's bedside AND on the patient's hospital floor or unit, greater than 50% of which was counseling/coordinating care for Port-A-Cath placement

## 2016-08-20 NOTE — ED Provider Notes (Signed)
[facility] DEPT Provider Note   CSN: [id number] Arrival date & time: 08/20/16  2039     History   Chief Complaint Chief Complaint  Patient presents with  . Fever    HPI Heather Hinton is a 56 y.o. female.  HPI Patient had a chest port placed today. She does have metastatic lung cancer. Patient reports that she was home and resting today after she woke up around 6 PM she felt hot. She measured her temperature and found to be 102.5. Patient denies she has any associated symptoms. She reports she has not been having any cough or chest congestion. No nausea vomiting or diarrhea. No pain or burning with urination. No extremity swelling or pain. She reports she came to the emergency department based on her instructions after the port placement or seek treatment if she developed fever patient took Tylenol at home at about 7:30 PM. Past Medical History:  Diagnosis Date  . Hypertension   . Lung cancer ([place])   . Spinal stenosis     Patient Active Problem List   Diagnosis Date Noted  . Goals of care, counseling/discussion 07/31/2016  . B12 deficiency anemia 07/30/2016  . Iron deficiency anemia   . Metastasis to brain ([place])   . 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 Metastasis to bone ([place])   . Left hip pain   . Palliative care by specialist   . Metastatic lung cancer (metastasis from lung to other site) ([place]) 07/14/2016  . Essential hypertension 07/14/2016  . Hypokalemia 07/14/2016  . Hyperglycemia 07/14/2016  . Symptomatic anemia 07/14/2016  . Thrombocytosis ([place]) 07/14/2016    Past Surgical History:  Procedure Laterality Date  . IR FLUORO GUIDE PORT INSERTION RIGHT  08/20/2016  . IR US GUIDE VASC ACCESS RIGHT  08/20/2016  . TUBAL LIGATION      OB History    Gravida Para Term Preterm AB Living   9 8           SAB TAB Ectopic Multiple Live Births                   Home Medications    Prior to Admission medications   Medication Sig Start Date End Date Taking?  Authorizing Provider  amLODipine (NORVASC) 10 MG tablet Take 10 mg by mouth at bedtime.   Yes [provider]  dexamethasone (DECADRON) 4 MG tablet Take 1 tablet (4 mg total) by mouth every 8 (eight) hours. 07/23/16  Yes Theodis Blaze, MD  HYDROcodone-acetaminophen (NORCO/VICODIN) 5-325 MG tablet Take 1-2 tablets by mouth every 4 (four) hours as needed for moderate pain or severe pain. 07/23/16  Yes Theodis Blaze, MD  lovastatin (MEVACOR) 20 MG tablet Take 20 mg by mouth at bedtime.   Yes [provider]  morphine (MS CONTIN) 15 MG 12 hr tablet Take 1 tablet (15 mg total) by mouth every 12 (twelve) hours. 08/02/16  Yes Heath Lark, MD  Multiple Vitamin (MULTIVITAMIN WITH MINERALS) TABS tablet Take 1 tablet by mouth daily.   Yes [provider]  pantoprazole (PROTONIX) 40 MG tablet Take 1 tablet (40 mg total) by mouth daily. 08/17/16  Yes Gorsuch, Ni, MD  polyethylene glycol (MIRALAX / GLYCOLAX) packet Take 17 g by mouth daily. 07/24/16  Yes Theodis Blaze, MD  senna-docusate (SENOKOT-S) 8.6-50 MG tablet Take 2 tablets by mouth 2 (two) times daily. 07/23/16  Yes Theodis Blaze, MD  lidocaine-prilocaine (EMLA) cream Apply 1 application topically as needed. 07/30/16  Heath Lark, MD    Family History Family History  Problem Relation Age of Onset  . Hypertension Mother   . Diabetes Mother   . Breast cancer Mother   . Hypertension Father   . Hypertension Sister   . Pancreatic cancer Sister   . Lung cancer Maternal Grandmother     Social History Social History  Substance Use Topics  . Smoking status: Former Smoker    Packs/day: 0.50    Years: 40.00    Types: Cigarettes    Quit date: 10/2015  . Smokeless tobacco: Never Used  . Alcohol use No     Allergies   Iron   Review of Systems Review of Systems 10 Systems reviewed and are negative for acute change except as noted in the HPI.   Physical Exam Updated Vital Signs BP 118/73 (BP Location: Right Arm)    Pulse (!) 107   Temp 98.1 F (36.7 C) (Oral)   Resp 15   SpO2 100%   Physical Exam  Constitutional: She is oriented to person, place, and time. She appears well-developed and well-nourished. No distress.  Patient is nontoxic and alert. No respiratory distress.  HENT:  Head: Normocephalic and atraumatic.  Nose: Nose normal.  Mouth/Throat: Oropharynx is clear and moist.  Eyes: Conjunctivae and EOM are normal.  Neck: Neck supple.  Cardiovascular: Regular rhythm.   No murmur heard. Borderline tachycardia. No gross rub murmur gallop  Pulmonary/Chest: Effort normal and breath sounds normal. No respiratory distress.  Right upper chest has newly placed port with a clear dressing present. No erythema or swelling. Nontender to palpation.  Abdominal: Soft. She exhibits no distension. There is no tenderness. There is no guarding.  Musculoskeletal: She exhibits no edema or tenderness.  Neurological: She is alert and oriented to person, place, and time. No cranial nerve deficit. She exhibits normal muscle tone.  Skin: Skin is warm and dry.  Psychiatric: She has a normal mood and affect.  Nursing note and vitals reviewed.    ED Treatments / Results  Labs (all labs ordered are listed, but only abnormal results are displayed) Labs Reviewed  COMPREHENSIVE METABOLIC PANEL - Abnormal; Notable for the following:       Result Value   Sodium 133 (*)    Chloride 99 (*)    Glucose, Bld 101 (*)    Calcium 8.7 (*)    Total Protein 6.1 (*)    Albumin 2.4 (*)    Alkaline Phosphatase 177 (*)    All other components within normal limits  CBC WITH DIFFERENTIAL/PLATELET - Abnormal; Notable for the following:    WBC 13.5 (*)    RBC 3.54 (*)    Hemoglobin 9.0 (*)    HCT 29.4 (*)    MCH 25.4 (*)    RDW 21.8 (*)    Platelets 145 (*)    Neutro Abs 11.7 (*)    All other components within normal limits  URINE CULTURE  CULTURE, BLOOD (ROUTINE X 2)  CULTURE, BLOOD (ROUTINE X 2)  URINALYSIS, ROUTINE W  REFLEX MICROSCOPIC  I-STAT CG4 LACTIC ACID, ED    EKG  EKG Interpretation None       Radiology Dg Chest 2 View  Result Date: 08/20/2016 CLINICAL DATA:  Initial evaluation for acute fever. EXAM: CHEST  2 VIEW COMPARISON:  Prior radiograph from 07/14/2016. FINDINGS: Interval placement of right-sided Port-A-Cath with tip overlying the distal SVC. Stable cardiac and mediastinal silhouettes with right hilar and mediastinal adenopathy. Lungs normally inflated. Right  middle lobe mass grossly unchanged. No focal infiltrates. No pulmonary edema or pleural effusion. No pneumothorax. No acute osseus abnormality. IMPRESSION: 1. Interval placement of right-sided Port-A-Cath with tip overlying the distal SVC. 2. The No significant interval change in right middle lobe mass with associated hilar and mediastinal adenopathy. 3. No other active cardiopulmonary disease. Electronically Signed   By: Jeannine Boga M.D.   On: 08/20/2016 23:05   Ir US Guide Vasc Access Right  Result Date: 08/20/2016 CLINICAL DATA:  Metastatic lung cancer, access for chemotherapy. EXAM: RIGHT INTERNAL JUGULAR SINGLE LUMEN POWER PORT CATHETER INSERTION Date:  7/9/20187/10/2016 1:40 pm Radiologist:  M. Daryll Brod, MD Guidance:  Ultrasound and fluoroscopic MEDICATIONS: Ancef 2 g; The antibiotic was administered within an appropriate time interval prior to skin puncture. ANESTHESIA/SEDATION: Versed 3.0 mg IV; Fentanyl 100 mcg IV; Moderate Sedation Time:  28 minutes The patient was continuously monitored during the procedure by the interventional radiology nurse under my direct supervision. FLUOROSCOPY TIME:  36 seconds (4 mGy) COMPLICATIONS: None immediate. CONTRAST:  None. PROCEDURE: Informed consent was obtained from the patient following explanation of the procedure, risks, benefits and alternatives. The patient understands, agrees and consents for the procedure. All questions were addressed. A time out was performed. Maximal barrier  sterile technique utilized including caps, mask, sterile gowns, sterile gloves, large sterile drape, hand hygiene, and 2% chlorhexidine scrub. Under sterile conditions and local anesthesia, right internal jugular micropuncture venous access was performed. Access was performed with ultrasound. Images were obtained for documentation. A guide wire was inserted followed by a transitional dilator. This allowed insertion of a guide wire and catheter into the IVC. Measurements were obtained from the SVC / RA junction back to the right IJ venotomy site. In the right infraclavicular chest, a subcutaneous pocket was created over the second anterior rib. This was done under sterile conditions and local anesthesia. 1% lidocaine with epinephrine was utilized for this. A 2.5 cm incision was made in the skin. Blunt dissection was performed to create a subcutaneous pocket over the right pectoralis major muscle. The pocket was flushed with saline vigorously. There was adequate hemostasis. The port catheter was assembled and checked for leakage. The port catheter was secured in the pocket with two retention sutures. The tubing was tunneled subcutaneously to the right venotomy site and inserted into the SVC/RA junction through a valved peel-away sheath. Position was confirmed with fluoroscopy. Images were obtained for documentation. The patient tolerated the procedure well. No immediate complications. Incisions were closed in a two layer fashion with 4 - 0 Vicryl suture. Dermabond was applied to the skin. The port catheter was accessed, blood was aspirated followed by saline and heparin flushes. Needle was removed. A dry sterile dressing was applied. IMPRESSION: Ultrasound and fluoroscopically guided right internal jugular single lumen power port catheter insertion. Tip in the SVC/RA junction. Catheter ready for use. Electronically Signed   By: Jerilynn Mages.  Shick M.D.   On: 08/20/2016 13:53   Ir Fluoro Guide Port Insertion Right  Result  Date: 08/20/2016 CLINICAL DATA:  Metastatic lung cancer, access for chemotherapy. EXAM: RIGHT INTERNAL JUGULAR SINGLE LUMEN POWER PORT CATHETER INSERTION Date:  7/9/20187/10/2016 1:40 pm Radiologist:  M. Daryll Brod, MD Guidance:  Ultrasound and fluoroscopic MEDICATIONS: Ancef 2 g; The antibiotic was administered within an appropriate time interval prior to skin puncture. ANESTHESIA/SEDATION: Versed 3.0 mg IV; Fentanyl 100 mcg IV; Moderate Sedation Time:  28 minutes The patient was continuously monitored during the procedure by the interventional radiology nurse under  my direct supervision. FLUOROSCOPY TIME:  36 seconds (4 mGy) COMPLICATIONS: None immediate. CONTRAST:  None. PROCEDURE: Informed consent was obtained from the patient following explanation of the procedure, risks, benefits and alternatives. The patient understands, agrees and consents for the procedure. All questions were addressed. A time out was performed. Maximal barrier sterile technique utilized including caps, mask, sterile gowns, sterile gloves, large sterile drape, hand hygiene, and 2% chlorhexidine scrub. Under sterile conditions and local anesthesia, right internal jugular micropuncture venous access was performed. Access was performed with ultrasound. Images were obtained for documentation. A guide wire was inserted followed by a transitional dilator. This allowed insertion of a guide wire and catheter into the IVC. Measurements were obtained from the SVC / RA junction back to the right IJ venotomy site. In the right infraclavicular chest, a subcutaneous pocket was created over the second anterior rib. This was done under sterile conditions and local anesthesia. 1% lidocaine with epinephrine was utilized for this. A 2.5 cm incision was made in the skin. Blunt dissection was performed to create a subcutaneous pocket over the right pectoralis major muscle. The pocket was flushed with saline vigorously. There was adequate hemostasis. The port  catheter was assembled and checked for leakage. The port catheter was secured in the pocket with two retention sutures. The tubing was tunneled subcutaneously to the right venotomy site and inserted into the SVC/RA junction through a valved peel-away sheath. Position was confirmed with fluoroscopy. Images were obtained for documentation. The patient tolerated the procedure well. No immediate complications. Incisions were closed in a two layer fashion with 4 - 0 Vicryl suture. Dermabond was applied to the skin. The port catheter was accessed, blood was aspirated followed by saline and heparin flushes. Needle was removed. A dry sterile dressing was applied. IMPRESSION: Ultrasound and fluoroscopically guided right internal jugular single lumen power port catheter insertion. Tip in the SVC/RA junction. Catheter ready for use. Electronically Signed   By: Jerilynn Mages.  Shick M.D.   On: 08/20/2016 13:53    Procedures Procedures (including critical care time)  Medications Ordered in ED Medications  sodium chloride 0.9 % bolus 1,000 mL (not administered)  0.9 %  sodium chloride infusion ( Intravenous New Bag/Given 08/20/16 2332)     Initial Impression / Assessment and Plan / ED Course  I have reviewed the triage vital signs and the nursing notes.  Pertinent labs & imaging results that were available during my care of the patient were reviewed by me and considered in my medical decision making (see chart for details).     Final Clinical Impressions(s) / ED Diagnoses   Final diagnoses:  Fever, unspecified fever cause  Metastatic cancer ([place])   Patient does not have any focal symptoms. She did have a Port-A-Cath placed today. There is no evident redness or discomfort around the Port-A-Cath. No fevers present at this time in the emergency department. Blood cultures and diagnostic evaluation have been obtained. At this time feel patient is stable for discharge with close follow-up. She is going to be seen tomorrow  in the morning by radiation oncology. Patient is counseled on necessity of following up on the blood cultures and immediate return should she develop any symptoms or recurrent fever. New Prescriptions New Prescriptions   No medications on file     Charlesetta Shanks, MD 08/21/16 [id number]

## 2016-08-20 NOTE — Procedures (Signed)
Met lung ca  S/p RT IJ POWER PORT  No comp Stable EBL 0 TIP SVCRA READY FOR USE

## 2016-08-20 NOTE — Discharge Instructions (Signed)
Implanted Port Insertion, Care After °This sheet gives you information about how to care for yourself after your procedure. Your health care provider may also give you more specific instructions. If you have problems or questions, contact your health care provider. °What can I expect after the procedure? °After your procedure, it is common to have: °· Discomfort at the port insertion site. °· Bruising on the skin over the port. This should improve over 3-4 days. ° °Follow these instructions at home: °Port care °· After your port is placed, you will get a manufacturer's information card. The card has information about your port. Keep this card with you at all times. °· Take care of the port as told by your health care provider. Ask your health care provider if you or a family member can get training for taking care of the port at home. A home health care nurse may also take care of the port. °· Make sure to remember what type of port you have. °Incision care °· Follow instructions from your health care provider about how to take care of your port insertion site. Make sure you: °? Wash your hands with soap and water before you change your bandage (dressing). If soap and water are not available, use hand sanitizer. °? Change your dressing as told by your health care provider. °? Leave stitches (sutures), skin glue, or adhesive strips in place. These skin closures may need to stay in place for 2 weeks or longer. If adhesive strip edges start to loosen and curl up, you may trim the loose edges. Do not remove adhesive strips completely unless your health care provider tells you to do that. °· Check your port insertion site every day for signs of infection. Check for: °? More redness, swelling, or pain. °? More fluid or blood. °? Warmth. °? Pus or a bad smell. °General instructions °· Do not take baths, swim, or use a hot tub until your health care provider approves. °· Do not lift anything that is heavier than 10 lb (4.5  kg) for a week, or as told by your health care provider. °· Ask your health care provider when it is okay to: °? Return to work or school. °? Resume usual physical activities or sports. °· Do not drive for 24 hours if you were given a medicine to help you relax (sedative). °· Take over-the-counter and prescription medicines only as told by your health care provider. °· Wear a medical alert bracelet in case of an emergency. This will tell any health care providers that you have a port. °· Keep all follow-up visits as told by your health care provider. This is important. °Contact a health care provider if: °· You cannot flush your port with saline as directed, or you cannot draw blood from the port. °· You have a fever or chills. °· You have more redness, swelling, or pain around your port insertion site. °· You have more fluid or blood coming from your port insertion site. °· Your port insertion site feels warm to the touch. °· You have pus or a bad smell coming from the port insertion site. °Get help right away if: °· You have chest pain or shortness of breath. °· You have bleeding from your port that you cannot control. °Summary °· Take care of the port as told by your health care provider. °· Change your dressing as told by your health care provider. °· Keep all follow-up visits as told by your health care provider. °  This information is not intended to replace advice given to you by your health care provider. Make sure you discuss any questions you have with your health care provider. °Document Released: 11/19/2012 Document Revised: 12/21/2015 Document Reviewed: 12/21/2015 °Elsevier Interactive Patient Education © 2017 [employer] °Moderate Conscious Sedation, Adult, Care After °These instructions provide you with information about caring for yourself after your procedure. Your health care provider may also give you more specific instructions. Your treatment has been planned according to current medical  practices, but problems sometimes occur. Call your health care provider if you have any problems or questions after your procedure. °What can I expect after the procedure? °After your procedure, it is common: °· To feel sleepy for several hours. °· To feel clumsy and have poor balance for several hours. °· To have poor judgment for several hours. °· To vomit if you eat too soon. ° °Follow these instructions at home: °For at least 24 hours after the procedure: ° °· Do not: °? Participate in activities where you could fall or become injured. °? Drive. °? Use heavy machinery. °? Drink alcohol. °? Take sleeping pills or medicines that cause drowsiness. °? Make important decisions or sign legal documents. °? Take care of children on your own. °· Rest. °Eating and drinking °· Follow the diet recommended by your health care provider. °· If you vomit: °? Drink water, juice, or soup when you can drink without vomiting. °? Make sure you have little or no nausea before eating solid foods. °General instructions °· Have a responsible adult stay with you until you are awake and alert. °· Take over-the-counter and prescription medicines only as told by your health care provider. °· If you smoke, do not smoke without supervision. °· Keep all follow-up visits as told by your health care provider. This is important. °Contact a health care provider if: °· You keep feeling nauseous or you keep vomiting. °· You feel light-headed. °· You develop a rash. °· You have a fever. °Get help right away if: °· You have trouble breathing. °This information is not intended to replace advice given to you by your health care provider. Make sure you discuss any questions you have with your health care provider. °Document Released: 11/19/2012 Document Revised: 07/04/2015 Document Reviewed: 05/21/2015 °Elsevier Interactive Patient Education © 2018 [employer] ° °

## 2016-08-20 NOTE — ED Triage Notes (Signed)
Pt states she had a port a cath placed this morning in her right chest wall and this afternoon she started running a fever  Pt states the highest it was at home was 102.5  Pt states she has taken two regular tylenol about 1930

## 2016-08-20 NOTE — Sedation Documentation (Signed)
Patient is resting comfortably. 

## 2016-08-21 ENCOUNTER — Ambulatory Visit (HOSPITAL_BASED_OUTPATIENT_CLINIC_OR_DEPARTMENT_OTHER): Payer: Medicaid Other | Admitting: Hematology and Oncology

## 2016-08-21 ENCOUNTER — Ambulatory Visit: Payer: Medicaid Other | Admitting: Nutrition

## 2016-08-21 ENCOUNTER — Encounter: Payer: Medicaid Other | Admitting: Nutrition

## 2016-08-21 ENCOUNTER — Ambulatory Visit (HOSPITAL_BASED_OUTPATIENT_CLINIC_OR_DEPARTMENT_OTHER): Payer: Medicaid Other

## 2016-08-21 ENCOUNTER — Ambulatory Visit: Payer: Medicaid Other

## 2016-08-21 ENCOUNTER — Other Ambulatory Visit (HOSPITAL_BASED_OUTPATIENT_CLINIC_OR_DEPARTMENT_OTHER): Payer: Medicaid Other

## 2016-08-21 ENCOUNTER — Telehealth: Payer: Self-pay | Admitting: Hematology and Oncology

## 2016-08-21 VITALS — HR 117

## 2016-08-21 VITALS — BP 120/64 | HR 134 | Temp 98.4°F | Resp 18 | Ht 67.0 in | Wt 130.4 lb

## 2016-08-21 DIAGNOSIS — C3490 Malignant neoplasm of unspecified part of unspecified bronchus or lung: Secondary | ICD-10-CM | POA: Diagnosis present

## 2016-08-21 DIAGNOSIS — C7931 Secondary malignant neoplasm of brain: Secondary | ICD-10-CM

## 2016-08-21 DIAGNOSIS — Z5111 Encounter for antineoplastic chemotherapy: Secondary | ICD-10-CM | POA: Diagnosis present

## 2016-08-21 DIAGNOSIS — E43 Unspecified severe protein-calorie malnutrition: Secondary | ICD-10-CM

## 2016-08-21 DIAGNOSIS — C7951 Secondary malignant neoplasm of bone: Secondary | ICD-10-CM | POA: Diagnosis not present

## 2016-08-21 DIAGNOSIS — Z7189 Other specified counseling: Secondary | ICD-10-CM

## 2016-08-21 DIAGNOSIS — D519 Vitamin B12 deficiency anemia, unspecified: Secondary | ICD-10-CM

## 2016-08-21 DIAGNOSIS — G893 Neoplasm related pain (acute) (chronic): Secondary | ICD-10-CM | POA: Diagnosis not present

## 2016-08-21 DIAGNOSIS — D509 Iron deficiency anemia, unspecified: Secondary | ICD-10-CM | POA: Diagnosis not present

## 2016-08-21 LAB — COMPREHENSIVE METABOLIC PANEL
ALBUMIN: 2 g/dL — AB (ref 3.5–5.0)
ALK PHOS: 184 U/L — AB (ref 40–150)
ALT: 20 U/L (ref 0–55)
AST: 15 U/L (ref 5–34)
Anion Gap: 9 mEq/L (ref 3–11)
BILIRUBIN TOTAL: 0.5 mg/dL (ref 0.20–1.20)
BUN: 12.5 mg/dL (ref 7.0–26.0)
CO2: 23 meq/L (ref 22–29)
Calcium: 9.1 mg/dL (ref 8.4–10.4)
Chloride: 100 mEq/L (ref 98–109)
Creatinine: 0.7 mg/dL (ref 0.6–1.1)
EGFR: >90 mL/min/{1.73_m2} (ref >90)
GLUCOSE: 126 mg/dL (ref 70–140)
Potassium: 3.7 mEq/L (ref 3.5–5.1)
SODIUM: 132 meq/L — AB (ref 136–145)
TOTAL PROTEIN: 5.9 g/dL — AB (ref 6.4–8.3)

## 2016-08-21 LAB — CBC WITH DIFFERENTIAL/PLATELET
BASO%: 0.3 % (ref 0.0–2.0)
BASOS ABS: 0 10*3/uL (ref 0.0–0.1)
EOS%: 0.1 % (ref 0.0–7.0)
Eosinophils Absolute: 0 10*3/uL (ref 0.0–0.5)
HCT: 28.4 % — ABNORMAL LOW (ref 34.8–46.6)
HEMOGLOBIN: 8.9 g/dL — AB (ref 11.6–15.9)
LYMPH#: 0.9 10*3/uL (ref 0.9–3.3)
LYMPH%: 7.5 % — AB (ref 14.0–49.7)
MCH: 26.1 pg (ref 25.1–34.0)
MCHC: 31.4 g/dL — ABNORMAL LOW (ref 31.5–36.0)
MCV: 83.2 fL (ref 79.5–101.0)
MONO#: 0.4 10*3/uL (ref 0.1–0.9)
MONO%: 3.7 % (ref 0.0–14.0)
NEUT%: 88.4 % — AB (ref 38.4–76.8)
NEUTROS ABS: 10.3 10*3/uL — AB (ref 1.5–6.5)
Platelets: 160 10*3/uL (ref 145–400)
RBC: 3.42 10*6/uL — AB (ref 3.70–5.45)
RDW: 24.2 % — ABNORMAL HIGH (ref 11.2–14.5)
WBC: 11.7 10*3/uL — ABNORMAL HIGH (ref 3.9–10.3)

## 2016-08-21 LAB — TECHNOLOGIST REVIEW

## 2016-08-21 LAB — URIC ACID: Uric Acid, Serum: 4.6 mg/dl (ref 2.6–7.4)

## 2016-08-21 MED ORDER — HEPARIN SOD (PORK) LOCK FLUSH 100 UNIT/ML IV SOLN
500.0000 [IU] | Freq: Once | INTRAVENOUS | Status: AC | PRN
  Administered 2016-08-21: 500 [IU]
  Filled 2016-08-21: qty 5

## 2016-08-21 MED ORDER — HYDROCODONE-ACETAMINOPHEN 5-325 MG PO TABS: 1.0000 | ORAL_TABLET | ORAL | 0 refills | Status: DC | PRN

## 2016-08-21 MED ORDER — DEXAMETHASONE SODIUM PHOSPHATE 10 MG/ML IJ SOLN
10.0000 mg | Freq: Once | INTRAMUSCULAR | Status: AC
Start: 2016-08-21 — End: 2016-08-21
  Administered 2016-08-21: 10 mg via INTRAVENOUS

## 2016-08-21 MED ORDER — SODIUM CHLORIDE 0.9 % IV SOLN
497.0000 mg | Freq: Once | INTRAVENOUS | Status: AC
  Administered 2016-08-21: 500 mg via INTRAVENOUS
  Filled 2016-08-21: qty 50

## 2016-08-21 MED ORDER — PROCHLORPERAZINE MALEATE 10 MG PO TABS: 10.0000 mg | ORAL_TABLET | Freq: Four times a day (QID) | ORAL | 1 refills | Status: DC | PRN

## 2016-08-21 MED ORDER — SODIUM CHLORIDE 0.9 % IV SOLN
Freq: Once | INTRAVENOUS | Status: AC
  Administered 2016-08-21: 11:00:00 via INTRAVENOUS

## 2016-08-21 MED ORDER — ONDANSETRON HCL 8 MG PO TABS: 8.0000 mg | ORAL_TABLET | Freq: Two times a day (BID) | ORAL | 1 refills | Status: DC | PRN

## 2016-08-21 MED ORDER — SODIUM CHLORIDE 0.9 % IV SOLN
483.0000 mg/m2 | Freq: Once | INTRAVENOUS | Status: AC
  Administered 2016-08-21: 800 mg via INTRAVENOUS
  Filled 2016-08-21: qty 20

## 2016-08-21 MED ORDER — DEXAMETHASONE SODIUM PHOSPHATE 10 MG/ML IJ SOLN
INTRAMUSCULAR | Status: AC
  Filled 2016-08-21: qty 1

## 2016-08-21 MED ORDER — SODIUM CHLORIDE 0.9% FLUSH
10.0000 mL | INTRAVENOUS | Status: DC | PRN
  Administered 2016-08-21: 10 mL
  Filled 2016-08-21: qty 10

## 2016-08-21 MED ORDER — PALONOSETRON HCL INJECTION 0.25 MG/5ML
INTRAVENOUS | Status: AC
  Filled 2016-08-21: qty 5

## 2016-08-21 MED ORDER — DEXAMETHASONE 4 MG PO TABS: ORAL_TABLET | ORAL | 1 refills | Status: DC

## 2016-08-21 MED ORDER — PALONOSETRON HCL INJECTION 0.25 MG/5ML
0.2500 mg | Freq: Once | INTRAVENOUS | Status: AC
  Administered 2016-08-21: 0.25 mg via INTRAVENOUS

## 2016-08-21 NOTE — Patient Instructions (Signed)
[facility] Discharge Instructions for Patients Receiving Chemotherapy  Today you received the following chemotherapy agents Carboplatin & Alimta  To help prevent nausea and vomiting after your treatment, we encourage you to take your nausea medication as prescribed.   If you develop nausea and vomiting that is not controlled by your nausea medication, call the clinic.   BELOW ARE SYMPTOMS THAT SHOULD BE REPORTED IMMEDIATELY:  *FEVER GREATER THAN 100.5 F  *CHILLS WITH OR WITHOUT FEVER  NAUSEA AND VOMITING THAT IS NOT CONTROLLED WITH YOUR NAUSEA MEDICATION  *UNUSUAL SHORTNESS OF BREATH  *UNUSUAL BRUISING OR BLEEDING  TENDERNESS IN MOUTH AND THROAT WITH OR WITHOUT PRESENCE OF ULCERS  *URINARY PROBLEMS  *BOWEL PROBLEMS  UNUSUAL RASH Items with * indicate a potential emergency and should be followed up as soon as possible.  Feel free to call the clinic you have any questions or concerns. The clinic phone number is [phone].  Please show the [facility] at check-in to the Emergency Department and triage nurse.   Carboplatin injection What is this medicine? CARBOPLATIN (KAR boe pla tin) is a chemotherapy drug. It targets fast dividing cells, like cancer cells, and causes these cells to die. This medicine is used to treat ovarian cancer and many other cancers. This medicine may be used for other purposes; ask your health care provider or pharmacist if you have questions. COMMON BRAND NAME(S): Paraplatin What should I tell my health care provider before I take this medicine? They need to know if you have any of these conditions: -blood disorders -hearing problems -kidney disease -recent or ongoing radiation therapy -an unusual or allergic reaction to carboplatin, cisplatin, other chemotherapy, other medicines, foods, dyes, or preservatives -pregnant or trying to get pregnant -breast-feeding How should I use this medicine? This drug is usually given  as an infusion into a vein. It is administered in a hospital or clinic by a specially trained health care professional. Talk to your pediatrician regarding the use of this medicine in children. Special care may be needed. Overdosage: If you think you have taken too much of this medicine contact a poison control center or emergency room at once. NOTE: This medicine is only for you. Do not share this medicine with others. What if I miss a dose? It is important not to miss a dose. Call your doctor or health care professional if you are unable to keep an appointment. What may interact with this medicine? -medicines for seizures -medicines to increase blood counts like filgrastim, pegfilgrastim, sargramostim -some antibiotics like amikacin, gentamicin, neomycin, streptomycin, tobramycin -vaccines Talk to your doctor or health care professional before taking any of these medicines: -acetaminophen -aspirin -ibuprofen -ketoprofen -naproxen This list may not describe all possible interactions. Give your health care provider a list of all the medicines, herbs, non-prescription drugs, or dietary supplements you use. Also tell them if you smoke, drink alcohol, or use illegal drugs. Some items may interact with your medicine. What should I watch for while using this medicine? Your condition will be monitored carefully while you are receiving this medicine. You will need important blood work done while you are taking this medicine. This drug may make you feel generally unwell. This is not uncommon, as chemotherapy can affect healthy cells as well as cancer cells. Report any side effects. Continue your course of treatment even though you feel ill unless your doctor tells you to stop. In some cases, you may be given additional medicines to help with side effects.  Follow all directions for their use. Call your doctor or health care professional for advice if you get a fever, chills or sore throat, or other  symptoms of a cold or flu. Do not treat yourself. This drug decreases your body's ability to fight infections. Try to avoid being around people who are sick. This medicine may increase your risk to bruise or bleed. Call your doctor or health care professional if you notice any unusual bleeding. Be careful brushing and flossing your teeth or using a toothpick because you may get an infection or bleed more easily. If you have any dental work done, tell your dentist you are receiving this medicine. Avoid taking products that contain aspirin, acetaminophen, ibuprofen, naproxen, or ketoprofen unless instructed by your doctor. These medicines may hide a fever. Do not become pregnant while taking this medicine. Women should inform their doctor if they wish to become pregnant or think they might be pregnant. There is a potential for serious side effects to an unborn child. Talk to your health care professional or pharmacist for more information. Do not breast-feed an infant while taking this medicine. What side effects may I notice from receiving this medicine? Side effects that you should report to your doctor or health care professional as soon as possible: -allergic reactions like skin rash, itching or hives, swelling of the face, lips, or tongue -signs of infection - fever or chills, cough, sore throat, pain or difficulty passing urine -signs of decreased platelets or bleeding - bruising, pinpoint red spots on the skin, black, tarry stools, nosebleeds -signs of decreased red blood cells - unusually weak or tired, fainting spells, lightheadedness -breathing problems -changes in hearing -changes in vision -chest pain -high blood pressure -low blood counts - This drug may decrease the number of white blood cells, red blood cells and platelets. You may be at increased risk for infections and bleeding. -nausea and vomiting -pain, swelling, redness or irritation at the injection site -pain, tingling,  numbness in the hands or feet -problems with balance, talking, walking -trouble passing urine or change in the amount of urine Side effects that usually do not require medical attention (report to your doctor or health care professional if they continue or are bothersome): -hair loss -loss of appetite -metallic taste in the mouth or changes in taste This list may not describe all possible side effects. Call your doctor for medical advice about side effects. You may report side effects to FDA at 1-800-FDA-1088. Where should I keep my medicine? This drug is given in a hospital or clinic and will not be stored at home. NOTE: This sheet is a summary. It may not cover all possible information. If you have questions about this medicine, talk to your doctor, pharmacist, or health care provider.  2018 Elsevier/Gold Standard (2007-05-06 14:38:05)  Pemetrexed injection (ALIMTA) What is this medicine? PEMETREXED (PEM e TREX ed) is a chemotherapy drug used to treat lung cancers like non-small cell lung cancer and mesothelioma. It may also be used to treat other cancers. This medicine may be used for other purposes; ask your health care provider or pharmacist if you have questions. COMMON BRAND NAME(S): Alimta What should I tell my health care provider before I take this medicine? They need to know if you have any of these conditions: -infection (especially a virus infection such as chickenpox, cold sores, or herpes) -kidney disease -low blood counts, like low white cell, platelet, or red cell counts -lung or breathing disease, like asthma -radiation therapy -  an unusual or allergic reaction to pemetrexed, other medicines, foods, dyes, or preservative -pregnant or trying to get pregnant -breast-feeding How should I use this medicine? This drug is given as an infusion into a vein. It is administered in a hospital or clinic by a specially trained health care professional. Talk to your pediatrician  regarding the use of this medicine in children. Special care may be needed. Overdosage: If you think you have taken too much of this medicine contact a poison control center or emergency room at once. NOTE: This medicine is only for you. Do not share this medicine with others. What if I miss a dose? It is important not to miss your dose. Call your doctor or health care professional if you are unable to keep an appointment. What may interact with this medicine? This medicine may interact with the following medications: -Ibuprofen This list may not describe all possible interactions. Give your health care provider a list of all the medicines, herbs, non-prescription drugs, or dietary supplements you use. Also tell them if you smoke, drink alcohol, or use illegal drugs. Some items may interact with your medicine. What should I watch for while using this medicine? Visit your doctor for checks on your progress. This drug may make you feel generally unwell. This is not uncommon, as chemotherapy can affect healthy cells as well as cancer cells. Report any side effects. Continue your course of treatment even though you feel ill unless your doctor tells you to stop. In some cases, you may be given additional medicines to help with side effects. Follow all directions for their use. Call your doctor or health care professional for advice if you get a fever, chills or sore throat, or other symptoms of a cold or flu. Do not treat yourself. This drug decreases your body's ability to fight infections. Try to avoid being around people who are sick. This medicine may increase your risk to bruise or bleed. Call your doctor or health care professional if you notice any unusual bleeding. Be careful brushing and flossing your teeth or using a toothpick because you may get an infection or bleed more easily. If you have any dental work done, tell your dentist you are receiving this medicine. Avoid taking products that  contain aspirin, acetaminophen, ibuprofen, naproxen, or ketoprofen unless instructed by your doctor. These medicines may hide a fever. Call your doctor or health care professional if you get diarrhea or mouth sores. Do not treat yourself. To protect your kidneys, drink water or other fluids as directed while you are taking this medicine. Do not become pregnant while taking this medicine or for 6 months after stopping it. Women should inform their doctor if they wish to become pregnant or think they might be pregnant. Men should not father a child while taking this medicine and for 3 months after stopping it. This may interfere with the ability to father a child. You should talk to your doctor or health care professional if you are concerned about your fertility. There is a potential for serious side effects to an unborn child. Talk to your health care professional or pharmacist for more information. Do not breast-feed an infant while taking this medicine or for 1 week after stopping it. What side effects may I notice from receiving this medicine? Side effects that you should report to your doctor or health care professional as soon as possible: -allergic reactions like skin rash, itching or hives, swelling of the face, lips, or tongue -breathing problems -  redness, blistering, peeling or loosening of the skin, including inside the mouth -signs and symptoms of bleeding such as bloody or black, tarry stools; red or dark-brown urine; spitting up blood or brown material that looks like coffee grounds; red spots on the skin; unusual bruising or bleeding from the eye, gums, or nose -signs and symptoms of infection like fever or chills; cough; sore throat; pain or trouble passing urine -signs and symptoms of kidney injury like trouble passing urine or change in the amount of urine -signs and symptoms of liver injury like dark yellow or brown urine; general ill feeling or flu-like symptoms; light-colored stools;  loss of appetite; nausea; right upper belly pain; unusually weak or tired; yellowing of the eyes or skin Side effects that usually do not require medical attention (report to your doctor or health care professional if they continue or are bothersome): -constipation -dizziness -mouth sores -nausea, vomiting -pain, tingling, numbness in the hands or feet -unusually weak or tired This list may not describe all possible side effects. Call your doctor for medical advice about side effects. You may report side effects to FDA at 1-800-FDA-1088. Where should I keep my medicine? This drug is given in a hospital or clinic and will not be stored at home. NOTE: This sheet is a summary. It may not cover all possible information. If you have questions about this medicine, talk to your doctor, pharmacist, or health care provider.  2018 Elsevier/Gold Standard (2015-11-29 18:51:46)

## 2016-08-21 NOTE — Progress Notes (Signed)
56 year old female diagnosed with metastatic lung cancer.  She is a patient of Dr. Alvy Bimler.  Past medical history includes vitamin B12 deficiency, spinal stenosis, and hypertension.  Medications include Decadron, Mevacor, multivitamin, MiraLAX, Senokot-S, Protonix, and Compazine.  Labs include glucose 132 on June 12.    Height: 5 feet 7 inches. Weight: 130.4 pounds. Usual body weight: 172 pounds in March 2018 BMI: 20.42.  Patient receiving chemotherapy which is palliative. She denies nausea, vomiting, constipation, and diarrhea. She is not drinking any oral nutrition supplements at this time. Reports somewhat of a poor appetite but feels that she has been eating a little bit more.  Nutrition diagnosis:  Severe malnutrition in the context of acute illness secondary to greater than 7.5% weight loss over 3 months, less than 50% energy intake for greater than 5 days, along with depletion of both body and muscle mass.  Intervention: Educated patient to increase calories and protein in 6 small meals/snacks daily. Reviewed importance of high protein foods. Answered questions regarding sugar and cancer. Encouraged patient to try oral nutrition supplements and provided recipes, samples, and coupons. Questions were answered.  Teach back method used.  Contact information provided.  Fact sheets were given.  Monitoring, evaluation, goals: Patient will tolerate increased calories and protein to minimize further weight loss and improve quality-of-life.  Next visit: Tuesday, July 31 during infusion.  **Disclaimer: This note was dictated with voice recognition software. Similar sounding words can inadvertently be transcribed and this note may contain transcription errors which may not have been corrected upon publication of note.**

## 2016-08-21 NOTE — Progress Notes (Signed)
Per Dr Alvy Bimler ok to tx with Vitals today, HR 117.

## 2016-08-21 NOTE — Telephone Encounter (Signed)
Scheduled appt per 7/10 los - Gave patient AVS and calender per los.

## 2016-08-21 NOTE — Discharge Instructions (Signed)
Return if you develop any symptoms of illness. Continue to monitor your temperature overnight. See her oncologist in the morning. Blood cultures have been drawn and should be preliminary results in 24 hours. Notify your oncologist of your visit to the emergency department.

## 2016-08-21 NOTE — Patient Instructions (Signed)
Implanted Port Home Guide An implanted port is a type of central line that is placed under the skin. Central lines are used to provide IV access when treatment or nutrition needs to be given through a person's veins. Implanted ports are used for long-term IV access. An implanted port may be placed because:  You need IV medicine that would be irritating to the small veins in your hands or arms.  You need long-term IV medicines, such as antibiotics.  You need IV nutrition for a long period.  You need frequent blood draws for lab tests.  You need dialysis.  Implanted ports are usually placed in the chest area, but they can also be placed in the upper arm, the abdomen, or the leg. An implanted port has two main parts:  Reservoir. The reservoir is round and will appear as a small, raised area under your skin. The reservoir is the part where a needle is inserted to give medicines or draw blood.  Catheter. The catheter is a thin, flexible tube that extends from the reservoir. The catheter is placed into a large vein. Medicine that is inserted into the reservoir goes into the catheter and then into the vein.  How will I care for my incision site? Do not get the incision site wet. Bathe or shower as directed by your health care provider. How is my port accessed? Special steps must be taken to access the port:  Before the port is accessed, a numbing cream can be placed on the skin. This helps numb the skin over the port site.  Your health care provider uses a sterile technique to access the port. ? Your health care provider must put on a mask and sterile gloves. ? The skin over your port is cleaned carefully with an antiseptic and allowed to dry. ? The port is gently pinched between sterile gloves, and a needle is inserted into the port.  Only "non-coring" port needles should be used to access the port. Once the port is accessed, a blood return should be checked. This helps ensure that the port  is in the vein and is not clogged.  If your port needs to remain accessed for a constant infusion, a clear (transparent) bandage will be placed over the needle site. The bandage and needle will need to be changed every week, or as directed by your health care provider.  Keep the bandage covering the needle clean and dry. Do not get it wet. Follow your health care provider's instructions on how to take a shower or bath while the port is accessed.  If your port does not need to stay accessed, no bandage is needed over the port.  What is flushing? Flushing helps keep the port from getting clogged. Follow your health care provider's instructions on how and when to flush the port. Ports are usually flushed with saline solution or a medicine called heparin. The need for flushing will depend on how the port is used.  If the port is used for intermittent medicines or blood draws, the port will need to be flushed: ? After medicines have been given. ? After blood has been drawn. ? As part of routine maintenance.  If a constant infusion is running, the port may not need to be flushed.  How long will my port stay implanted? The port can stay in for as long as your health care provider thinks it is needed. When it is time for the port to come out, surgery will be   done to remove it. The procedure is similar to the one performed when the port was put in. When should I seek immediate medical care? When you have an implanted port, you should seek immediate medical care if:  You notice a bad smell coming from the incision site.  You have swelling, redness, or drainage at the incision site.  You have more swelling or pain at the port site or the surrounding area.  You have a fever that is not controlled with medicine.  This information is not intended to replace advice given to you by your health care provider. Make sure you discuss any questions you have with your health care provider. Document  Released: 01/29/2005 Document Revised: 07/07/2015 Document Reviewed: 10/06/2012 Elsevier Interactive Patient Education  2017 [employer]  

## 2016-08-22 ENCOUNTER — Telehealth: Payer: Self-pay | Admitting: *Deleted

## 2016-08-22 NOTE — Telephone Encounter (Signed)
-----   Message from Thomasene Lot, RN sent at 08/21/2016  2:33 PM EDT ----- Regarding: Dr Alvy Bimler 1st tx f/u call 1st tx f/u call, alimta & carbo did well

## 2016-08-22 NOTE — Telephone Encounter (Signed)
This RN called and spoke to patient regarding first chemotherapy treatment. Patient stated," I didn't have any problems last night or today. No nausea, pain, fatigue. My appetite is good. I'm drinking plenty of fluids." Instructed patient to call if she has any questions or concerns. Patient verbalized understanding.

## 2016-08-23 ENCOUNTER — Encounter: Payer: Self-pay | Admitting: Hematology and Oncology

## 2016-08-23 DIAGNOSIS — E43 Unspecified severe protein-calorie malnutrition: Secondary | ICD-10-CM | POA: Insufficient documentation

## 2016-08-23 HISTORY — DX: Unspecified severe protein-calorie malnutrition: E43

## 2016-08-23 NOTE — Assessment & Plan Note (Signed)
The patient is aware she has incurable disease and treatment is strictly palliative. We discussed importance of Advanced Directives and Living will. We discussed CODE STATUS; the patient desires to remain in full code.

## 2016-08-23 NOTE — Assessment & Plan Note (Signed)
She has significant weight loss and signs of moderate to severe protein calorie malnutrition I discussed importance of increasing oral intake as tolerated

## 2016-08-23 NOTE — Assessment & Plan Note (Signed)
Unfortunately, PET CT scan is not done and her insurance has denied PET CT scan I recommend we proceed with treatment without delay The risks, benefits, side effects of combination chemotherapy with carboplatin and Alimta were discussed with the patient and she agreed to proceed She have received recent vitamin B12 injection She is reminded to take folic acid supplementation along with dexamethasone

## 2016-08-23 NOTE — Progress Notes (Signed)
[facility] OFF[facility] PROGRESS NOTE  Patient Care Team: Neale Burly, MD as PCP - General (Internal Medicine)  SUMMARY OF ONCOLOGIC HISTORY: Oncology History   PD-I testing negative Molecular testing: no actionable mutations     Metastatic lung cancer (metastasis from lung to other site) [facility])   07/14/2016 - 07/24/2016 Hospital Admission    Patient presented with weakness, left hip pain, fevers and chills. Initial imaging studies notable for probable primary lung malignancy with metastatic disease in the ischial tuberosity on the left with significant tumor burden as well as metastatic disease and brain.       07/14/2016 Imaging    CT chest, abdomen and pelvis showed: Right middle lobe mass lesion with associated hilar and mediastinal adenopathy and near complete right middle lobe collapse. This is consistent with a primary pulmonary neoplasm with metastatic lesions till proven otherwise. PET-CT and tissue sampling are recommended for further evaluation.  Small soft tissue nodule along the posterior costophrenic angle medially on the left. This may represent a small metastatic focus.  Bony metastatic disease involving the ischial tuberosity on the left with large significant surrounding soft tissue component consistent with metastatic disease.       07/16/2016 Pathology Results    Soft Tissue Needle Core Biopsy, left ischial - METASTATIC POORLY DIFFERENTIATED CARCINOMA, SEE COMMENT. Microscopic Comment The core biopsies show sheets of malignant cells. Immunohistochemistry is positive for cytokeratin 7. The cells are negative for cytokeratin 20, cytokeratin 5/6, p63, NapsinA, TTF-1, MelanA, S100, and PAX-8. The immunoprofile is somewhat non-specific, but in the setting of the patient's lung mass this may represent a lung primary. Foundation One and PDL-1 testing will be ordered.      07/16/2016 Pathology Results    PD-1 testing on tissue sample was negative      07/16/2016  Procedure    Technically successful core biopsy, left ischial mass      07/16/2016 Imaging    MR brain 1. Six enhancing lesions scattered throughout the brain compatible with metastatic disease. The largest lesion involves the left occipital lobe. 2. Surrounding vasogenic edema involves the 2 largest lesions with mass effect in the left occipital left lobe evidence by effacement of the sulci      07/18/2016 -  Radiation Therapy    She received radiation treatment to her brain, chest and bone      07/18/2016 Imaging    MR brain 1. Susceptibility artifact related to iron infusion yesterday. The brain metastases identified on 07/16/2016 are stable, but their margins are less distinct due to the iron artifact. Note that there may be a small subcentimeter metastasis adjacent to the largest mass in the left occipital lobe (series 11, image 67). Stable vasogenic edema and mild intracranial mass effect. 2. No new brain metastasis identified. 3. Stable signal abnormality along the left cerebellar peduncle and left lateral cerebellum which most resembles encephalomalacia.      08/20/2016 Procedure    Ultrasound and fluoroscopically guided right internal jugular single lumen power port catheter insertion. Tip in the SVC/RA junction. Catheter ready for use.      08/21/2016 -  Chemotherapy    She is started on palliative chemo with carboplatin and Alimta       INTERVAL HISTORY: Please see below for problem oriented charting. She returns to be seen prior to stop date of treatment Unfortunately, PET CT scan is not done because of insurance denial She denies significant pain Her mobility is poor and oral intake is  poor She denies constipation She denies recent headache or new neurological deficit Denies recent chest pain, cough or shortness of breath The patient denies any recent signs or symptoms of bleeding such as spontaneous epistaxis, hematuria or hematochezia.   REVIEW OF SYSTEMS:    Constitutional: Denies fevers, chills  Eyes: Denies blurriness of vision Ears, nose, mouth, throat, and face: Denies mucositis or sore throat Respiratory: Denies cough, dyspnea or wheezes Cardiovascular: Denies palpitation, chest discomfort or lower extremity swelling Gastrointestinal:  Denies nausea, heartburn or change in bowel habits Skin: Denies abnormal skin rashes Lymphatics: Denies new lymphadenopathy or easy bruising Neurological:Denies numbness, tingling or new weaknesses Behavioral/Psych: Mood is stable, no new changes  All other systems were reviewed with the patient and are negative.  I have reviewed the past medical history, past surgical history, social history and family history with the patient and they are unchanged from previous note.  ALLERGIES:  is allergic to iron.  MEDICATIONS:  Current Outpatient Prescriptions  Medication Sig Dispense Refill  . dexamethasone (DECADRON) 4 MG tablet Take 1 tablet (4 mg total) by mouth every 8 (eight) hours. 90 tablet 0  . dexamethasone (DECADRON) 4 MG tablet Take 1 tab two times a day the day before Alimta chemo. Take 2 tabs two times a day starting the day after chemo for 3 days. 30 tablet 1  . HYDROcodone-acetaminophen (NORCO/VICODIN) 5-325 MG tablet Take 1 tablet by mouth every 4 (four) hours as needed for moderate pain or severe pain. 90 tablet 0  . lidocaine-prilocaine (EMLA) cream Apply 1 application topically as needed. 30 g 6  . lovastatin (MEVACOR) 20 MG tablet Take 20 mg by mouth at bedtime.    Marland Kitchen morphine (MS CONTIN) 15 MG 12 hr tablet Take 1 tablet (15 mg total) by mouth every 12 (twelve) hours. 60 tablet 0  . Multiple Vitamin (MULTIVITAMIN WITH MINERALS) TABS tablet Take 1 tablet by mouth daily.    . ondansetron (ZOFRAN) 8 MG tablet Take 1 tablet (8 mg total) by mouth 2 (two) times daily as needed for refractory nausea / vomiting. Start on day 3 after chemo. 30 tablet 1  . pantoprazole (PROTONIX) 40 MG tablet Take 1  tablet (40 mg total) by mouth daily. 30 tablet 1  . polyethylene glycol (MIRALAX / GLYCOLAX) packet Take 17 g by mouth daily. 14 each 0  . prochlorperazine (COMPAZINE) 10 MG tablet Take 1 tablet (10 mg total) by mouth every 6 (six) hours as needed (Nausea or vomiting). 30 tablet 1  . senna-docusate (SENOKOT-S) 8.6-50 MG tablet Take 2 tablets by mouth 2 (two) times daily.     No current facility-administered medications for this visit.     PHYSICAL EXAMINATION: ECOG PERFORMANCE STATUS: 2 - Symptomatic, <50% confined to bed  Vitals:   08/21/16 0921  BP: 120/64  Pulse: (!) 134  Resp: 18  Temp: 98.4 F (36.9 C)   Filed Weights   08/21/16 0921  Weight: 130 lb 6.4 oz (59.1 kg)    GENERAL:alert, no distress and comfortable.  She looks thin and moderately cachectic SKIN: skin color, texture, turgor are normal, no rashes or significant lesions EYES: normal, Conjunctiva are pink and non-injected, sclera clear OROPHARYNX:no exudate, no erythema and lips, buccal mucosa, and tongue normal  NECK: supple, thyroid normal size, non-tender, without nodularity LYMPH:  no palpable lymphadenopathy in the cervical, axillary or inguinal LUNGS: clear to auscultation and percussion with normal breathing effort HEART: regular rate & rhythm and no murmurs and no lower extremity  edema ABDOMEN:abdomen soft, non-tender and normal bowel sounds Musculoskeletal:no cyanosis of digits and no clubbing  NEURO: alert & oriented x 3 with fluent speech, no focal motor/sensory deficits  LABORATORY DATA:  I have reviewed the data as listed    Component Value Date/Time   NA 132 (L) 08/21/2016 0831   K 3.7 08/21/2016 0831   CL 99 (L) 08/20/2016 2306   CO2 23 08/21/2016 0831   GLUCOSE 126 08/21/2016 0831   BUN 12.5 08/21/2016 0831   CREATININE 0.7 08/21/2016 0831   CALCIUM 9.1 08/21/2016 0831   PROT 5.9 (L) 08/21/2016 0831   ALBUMIN 2.0 (L) 08/21/2016 0831   AST 15 08/21/2016 0831   ALT 20 08/21/2016 0831    ALKPHOS 184 (H) 08/21/2016 0831   BILITOT 0.50 08/21/2016 0831   GFRNONAA >60 08/20/2016 2306   GFRAA >60 08/20/2016 2306    No results found for: SPEP, UPEP  Lab Results  Component Value Date   WBC 11.7 (H) 08/21/2016   NEUTROABS 10.3 (H) 08/21/2016   HGB 8.9 (L) 08/21/2016   HCT 28.4 (L) 08/21/2016   MCV 83.2 08/21/2016   PLT 160 08/21/2016      Chemistry      Component Value Date/Time   NA 132 (L) 08/21/2016 0831   K 3.7 08/21/2016 0831   CL 99 (L) 08/20/2016 2306   CO2 23 08/21/2016 0831   BUN 12.5 08/21/2016 0831   CREATININE 0.7 08/21/2016 0831      Component Value Date/Time   CALCIUM 9.1 08/21/2016 0831   ALKPHOS 184 (H) 08/21/2016 0831   AST 15 08/21/2016 0831   ALT 20 08/21/2016 0831   BILITOT 0.50 08/21/2016 0831       RADIOGRAPHIC STUDIES: I have personally reviewed the radiological images as listed and agreed with the findings in the report. Dg Chest 2 View  Result Date: 08/20/2016 CLINICAL DATA:  Initial evaluation for acute fever. EXAM: CHEST  2 VIEW COMPARISON:  Prior radiograph from 07/14/2016. FINDINGS: Interval placement of right-sided Port-A-Cath with tip overlying the distal SVC. Stable cardiac and mediastinal silhouettes with right hilar and mediastinal adenopathy. Lungs normally inflated. Right middle lobe mass grossly unchanged. No focal infiltrates. No pulmonary edema or pleural effusion. No pneumothorax. No acute osseus abnormality. IMPRESSION: 1. Interval placement of right-sided Port-A-Cath with tip overlying the distal SVC. 2. The No significant interval change in right middle lobe mass with associated hilar and mediastinal adenopathy. 3. No other active cardiopulmonary disease. Electronically Signed   By: Rise Mu M.D.   On: 08/20/2016 23:05   Ir US Guide Vasc Access Right  Result Date: 08/20/2016 CLINICAL DATA:  Metastatic lung cancer, access for chemotherapy. EXAM: RIGHT INTERNAL JUGULAR SINGLE LUMEN POWER PORT CATHETER INSERTION  Date:  7/9/20187/10/2016 1:40 pm Radiologist:  M. Ruel Favors, MD Guidance:  Ultrasound and fluoroscopic MEDICATIONS: Ancef 2 g; The antibiotic was administered within an appropriate time interval prior to skin puncture. ANESTHESIA/SEDATION: Versed 3.0 mg IV; Fentanyl 100 mcg IV; Moderate Sedation Time:  28 minutes The patient was continuously monitored during the procedure by the interventional radiology nurse under my direct supervision. FLUOROSCOPY TIME:  36 seconds (4 mGy) COMPLICATIONS: None immediate. CONTRAST:  None. PROCEDURE: Informed consent was obtained from the patient following explanation of the procedure, risks, benefits and alternatives. The patient understands, agrees and consents for the procedure. All questions were addressed. A time out was performed. Maximal barrier sterile technique utilized including caps, mask, sterile gowns, sterile gloves, large sterile drape, hand hygiene,  and 2% chlorhexidine scrub. Under sterile conditions and local anesthesia, right internal jugular micropuncture venous access was performed. Access was performed with ultrasound. Images were obtained for documentation. A guide wire was inserted followed by a transitional dilator. This allowed insertion of a guide wire and catheter into the IVC. Measurements were obtained from the SVC / RA junction back to the right IJ venotomy site. In the right infraclavicular chest, a subcutaneous pocket was created over the second anterior rib. This was done under sterile conditions and local anesthesia. 1% lidocaine with epinephrine was utilized for this. A 2.5 cm incision was made in the skin. Blunt dissection was performed to create a subcutaneous pocket over the right pectoralis major muscle. The pocket was flushed with saline vigorously. There was adequate hemostasis. The port catheter was assembled and checked for leakage. The port catheter was secured in the pocket with two retention sutures. The tubing was tunneled  subcutaneously to the right venotomy site and inserted into the SVC/RA junction through a valved peel-away sheath. Position was confirmed with fluoroscopy. Images were obtained for documentation. The patient tolerated the procedure well. No immediate complications. Incisions were closed in a two layer fashion with 4 - 0 Vicryl suture. Dermabond was applied to the skin. The port catheter was accessed, blood was aspirated followed by saline and heparin flushes. Needle was removed. A dry sterile dressing was applied. IMPRESSION: Ultrasound and fluoroscopically guided right internal jugular single lumen power port catheter insertion. Tip in the SVC/RA junction. Catheter ready for use. Electronically Signed   By: Judie Petit.  Shick M.D.   On: 08/20/2016 13:53   Ir Fluoro Guide Port Insertion Right  Result Date: 08/20/2016 CLINICAL DATA:  Metastatic lung cancer, access for chemotherapy. EXAM: RIGHT INTERNAL JUGULAR SINGLE LUMEN POWER PORT CATHETER INSERTION Date:  7/9/20187/10/2016 1:40 pm Radiologist:  M. Ruel Favors, MD Guidance:  Ultrasound and fluoroscopic MEDICATIONS: Ancef 2 g; The antibiotic was administered within an appropriate time interval prior to skin puncture. ANESTHESIA/SEDATION: Versed 3.0 mg IV; Fentanyl 100 mcg IV; Moderate Sedation Time:  28 minutes The patient was continuously monitored during the procedure by the interventional radiology nurse under my direct supervision. FLUOROSCOPY TIME:  36 seconds (4 mGy) COMPLICATIONS: None immediate. CONTRAST:  None. PROCEDURE: Informed consent was obtained from the patient following explanation of the procedure, risks, benefits and alternatives. The patient understands, agrees and consents for the procedure. All questions were addressed. A time out was performed. Maximal barrier sterile technique utilized including caps, mask, sterile gowns, sterile gloves, large sterile drape, hand hygiene, and 2% chlorhexidine scrub. Under sterile conditions and local anesthesia,  right internal jugular micropuncture venous access was performed. Access was performed with ultrasound. Images were obtained for documentation. A guide wire was inserted followed by a transitional dilator. This allowed insertion of a guide wire and catheter into the IVC. Measurements were obtained from the SVC / RA junction back to the right IJ venotomy site. In the right infraclavicular chest, a subcutaneous pocket was created over the second anterior rib. This was done under sterile conditions and local anesthesia. 1% lidocaine with epinephrine was utilized for this. A 2.5 cm incision was made in the skin. Blunt dissection was performed to create a subcutaneous pocket over the right pectoralis major muscle. The pocket was flushed with saline vigorously. There was adequate hemostasis. The port catheter was assembled and checked for leakage. The port catheter was secured in the pocket with two retention sutures. The tubing was tunneled subcutaneously to the right venotomy  site and inserted into the SVC/RA junction through a valved peel-away sheath. Position was confirmed with fluoroscopy. Images were obtained for documentation. The patient tolerated the procedure well. No immediate complications. Incisions were closed in a two layer fashion with 4 - 0 Vicryl suture. Dermabond was applied to the skin. The port catheter was accessed, blood was aspirated followed by saline and heparin flushes. Needle was removed. A dry sterile dressing was applied. IMPRESSION: Ultrasound and fluoroscopically guided right internal jugular single lumen power port catheter insertion. Tip in the SVC/RA junction. Catheter ready for use. Electronically Signed   By: Jerilynn Mages.  Shick M.D.   On: 08/20/2016 13:53    ASSESSMENT & PLAN:  Metastatic lung cancer (metastasis from lung to other site) [facility]) Unfortunately, PET CT scan is not done and her insurance has denied PET CT scan I recommend we proceed with treatment without delay The risks,  benefits, side effects of combination chemotherapy with carboplatin and Alimta were discussed with the patient and she agreed to proceed She have received recent vitamin B12 injection She is reminded to take folic acid supplementation along with dexamethasone  Metastasis to brain [facility]) She has completed radiation treatment I would defer to radiation oncologist for plan for steroid taper I plan to repeat MRI in 2 months  Cancer associated pain She has well-controlled pain with current prescription pain medicine I reminded her to check her prescription weekly We discussed narcotic refill policy  Y70 deficiency anemia I will schedule B12 injections  Severe protein-calorie malnutrition ([place]) She has significant weight loss and signs of moderate to severe protein calorie malnutrition I discussed importance of increasing oral intake as tolerated  Goals of care, counseling/discussion The patient is aware she has incurable disease and treatment is strictly palliative. We discussed importance of Advanced Directives and Living will. We discussed CODE STATUS; the patient desires to remain in full code.  Iron deficiency anemia She had recent iron deficiency anemia and had received intravenous iron replacement therapy I plan to repeat iron studies in the near future   Orders Placed This Encounter  Procedures  . Ferritin    Standing Status:   Future    Standing Expiration Date:   09/25/2017  . Iron and TIBC    Standing Status:   Future    Standing Expiration Date:   09/25/2017  . Hold Tube, Blood Bank    Standing Status:   Future    Standing Expiration Date:   09/25/2017   All questions were answered. The patient knows to call the clinic with any problems, questions or concerns. No barriers to learning was detected. I spent 30 minutes counseling the patient face to face. The total time spent in the appointment was 40 minutes and more than 50% was on counseling and review of test  results     Heath Lark, MD 08/23/2016 8:00 PM

## 2016-08-23 NOTE — Assessment & Plan Note (Signed)
She had recent iron deficiency anemia and had received intravenous iron replacement therapy I plan to repeat iron studies in the near future

## 2016-08-23 NOTE — Assessment & Plan Note (Signed)
She has well-controlled pain with current prescription pain medicine I reminded her to check her prescription weekly We discussed narcotic refill policy

## 2016-08-23 NOTE — Assessment & Plan Note (Signed)
She has completed radiation treatment I would defer to radiation oncologist for plan for steroid taper I plan to repeat MRI in 2 months

## 2016-08-23 NOTE — Assessment & Plan Note (Signed)
I will schedule B12 injections

## 2016-08-26 LAB — CULTURE, BLOOD (ROUTINE X 2)
Culture: NO GROWTH
Culture: NO GROWTH
SPECIAL REQUESTS: ADEQUATE
Special Requests: ADEQUATE

## 2016-08-28 ENCOUNTER — Emergency Department (HOSPITAL_COMMUNITY): Payer: Medicaid Other

## 2016-08-28 ENCOUNTER — Inpatient Hospital Stay (HOSPITAL_COMMUNITY)
Admission: EM | Admit: 2016-08-28 | Discharge: 2016-09-06 | DRG: 871 | Disposition: A | Discharge status: Home / Self Care | Payer: Medicaid Other | Attending: Internal Medicine | Admitting: Internal Medicine

## 2016-08-28 ENCOUNTER — Other Ambulatory Visit: Payer: Self-pay

## 2016-08-28 ENCOUNTER — Encounter (HOSPITAL_COMMUNITY): Payer: Self-pay

## 2016-08-28 DIAGNOSIS — E538 Deficiency of other specified B group vitamins: Secondary | ICD-10-CM | POA: Diagnosis present

## 2016-08-28 DIAGNOSIS — G893 Neoplasm related pain (acute) (chronic): Secondary | ICD-10-CM | POA: Diagnosis present

## 2016-08-28 DIAGNOSIS — E785 Hyperlipidemia, unspecified: Secondary | ICD-10-CM | POA: Diagnosis present

## 2016-08-28 DIAGNOSIS — R52 Pain, unspecified: Secondary | ICD-10-CM

## 2016-08-28 DIAGNOSIS — D519 Vitamin B12 deficiency anemia, unspecified: Secondary | ICD-10-CM | POA: Diagnosis present

## 2016-08-28 DIAGNOSIS — T451X5A Adverse effect of antineoplastic and immunosuppressive drugs, initial encounter: Secondary | ICD-10-CM | POA: Diagnosis present

## 2016-08-28 DIAGNOSIS — K922 Gastrointestinal hemorrhage, unspecified: Secondary | ICD-10-CM | POA: Diagnosis not present

## 2016-08-28 DIAGNOSIS — C3490 Malignant neoplasm of unspecified part of unspecified bronchus or lung: Secondary | ICD-10-CM

## 2016-08-28 DIAGNOSIS — Z6823 Body mass index (BMI) 23.0-23.9, adult: Secondary | ICD-10-CM

## 2016-08-28 DIAGNOSIS — L8990 Pressure ulcer of unspecified site, unspecified stage: Secondary | ICD-10-CM | POA: Insufficient documentation

## 2016-08-28 DIAGNOSIS — R0602 Shortness of breath: Secondary | ICD-10-CM

## 2016-08-28 DIAGNOSIS — Z8249 Family history of ischemic heart disease and other diseases of the circulatory system: Secondary | ICD-10-CM

## 2016-08-28 DIAGNOSIS — A419 Sepsis, unspecified organism: Secondary | ICD-10-CM | POA: Diagnosis present

## 2016-08-28 DIAGNOSIS — R000 Tachycardia, unspecified: Secondary | ICD-10-CM | POA: Diagnosis present

## 2016-08-28 DIAGNOSIS — D539 Nutritional anemia, unspecified: Secondary | ICD-10-CM | POA: Diagnosis present

## 2016-08-28 DIAGNOSIS — Z79899 Other long term (current) drug therapy: Secondary | ICD-10-CM | POA: Diagnosis not present

## 2016-08-28 DIAGNOSIS — R0603 Acute respiratory distress: Secondary | ICD-10-CM | POA: Diagnosis present

## 2016-08-28 DIAGNOSIS — D649 Anemia, unspecified: Secondary | ICD-10-CM | POA: Diagnosis not present

## 2016-08-28 DIAGNOSIS — Z7189 Other specified counseling: Secondary | ICD-10-CM | POA: Diagnosis not present

## 2016-08-28 DIAGNOSIS — Z7952 Long term (current) use of systemic steroids: Secondary | ICD-10-CM

## 2016-08-28 DIAGNOSIS — Z801 Family history of malignant neoplasm of trachea, bronchus and lung: Secondary | ICD-10-CM

## 2016-08-28 DIAGNOSIS — C7951 Secondary malignant neoplasm of bone: Secondary | ICD-10-CM | POA: Diagnosis present

## 2016-08-28 DIAGNOSIS — C7931 Secondary malignant neoplasm of brain: Secondary | ICD-10-CM

## 2016-08-28 DIAGNOSIS — D61810 Antineoplastic chemotherapy induced pancytopenia: Secondary | ICD-10-CM

## 2016-08-28 DIAGNOSIS — Z791 Long term (current) use of non-steroidal anti-inflammatories (NSAID): Secondary | ICD-10-CM | POA: Diagnosis not present

## 2016-08-28 DIAGNOSIS — I10 Essential (primary) hypertension: Secondary | ICD-10-CM | POA: Diagnosis present

## 2016-08-28 DIAGNOSIS — D518 Other vitamin B12 deficiency anemias: Secondary | ICD-10-CM

## 2016-08-28 DIAGNOSIS — R739 Hyperglycemia, unspecified: Secondary | ICD-10-CM | POA: Diagnosis present

## 2016-08-28 DIAGNOSIS — Z87891 Personal history of nicotine dependence: Secondary | ICD-10-CM

## 2016-08-28 DIAGNOSIS — D509 Iron deficiency anemia, unspecified: Secondary | ICD-10-CM | POA: Diagnosis present

## 2016-08-28 DIAGNOSIS — C3492 Malignant neoplasm of unspecified part of left bronchus or lung: Secondary | ICD-10-CM | POA: Diagnosis not present

## 2016-08-28 DIAGNOSIS — Z515 Encounter for palliative care: Secondary | ICD-10-CM

## 2016-08-28 DIAGNOSIS — R6510 Systemic inflammatory response syndrome (SIRS) of non-infectious origin without acute organ dysfunction: Secondary | ICD-10-CM

## 2016-08-28 DIAGNOSIS — E43 Unspecified severe protein-calorie malnutrition: Secondary | ICD-10-CM | POA: Diagnosis present

## 2016-08-28 DIAGNOSIS — C7949 Secondary malignant neoplasm of other parts of nervous system: Secondary | ICD-10-CM

## 2016-08-28 DIAGNOSIS — Z923 Personal history of irradiation: Secondary | ICD-10-CM

## 2016-08-28 DIAGNOSIS — C7952 Secondary malignant neoplasm of bone marrow: Secondary | ICD-10-CM

## 2016-08-28 HISTORY — DX: Hyperlipidemia, unspecified: E78.5

## 2016-08-28 HISTORY — DX: Iron deficiency anemia, unspecified: D50.9

## 2016-08-28 HISTORY — DX: Secondary malignant neoplasm of brain: C79.31

## 2016-08-28 HISTORY — DX: Secondary malignant neoplasm of bone: C79.51

## 2016-08-28 HISTORY — DX: Unspecified severe protein-calorie malnutrition: E43

## 2016-08-28 HISTORY — DX: Vitamin B12 deficiency anemia, unspecified: D51.9

## 2016-08-28 LAB — I-STAT TROPONIN, ED: TROPONIN I, POC: 0.05 ng/mL (ref 0.00–0.08)

## 2016-08-28 LAB — COMPREHENSIVE METABOLIC PANEL
ALK PHOS: 149 U/L — AB (ref 38–126)
ALT: 24 U/L (ref 14–54)
AST: 15 U/L (ref 15–41)
Albumin: 2.4 g/dL — ABNORMAL LOW (ref 3.5–5.0)
Anion gap: 14 (ref 5–15)
BUN: 20 mg/dL (ref 6–20)
CALCIUM: 10 mg/dL (ref 8.9–10.3)
CO2: 24 mmol/L (ref 22–32)
Chloride: 95 mmol/L — ABNORMAL LOW (ref 101–111)
Creatinine, Ser: 0.66 mg/dL (ref 0.44–1.00)
GFR calc non Af Amer: >60 mL/min (ref >60)
GLUCOSE: 184 mg/dL — AB (ref 65–99)
Potassium: 3.7 mmol/L (ref 3.5–5.1)
SODIUM: 133 mmol/L — AB (ref 135–145)
Total Bilirubin: 1 mg/dL (ref 0.3–1.2)
Total Protein: 6.7 g/dL (ref 6.5–8.1)

## 2016-08-28 LAB — CBC WITH DIFFERENTIAL/PLATELET
BASOS ABS: 0 10*3/uL (ref 0.0–0.1)
Basophils Relative: 1 %
EOS ABS: 0 10*3/uL (ref 0.0–0.7)
Eosinophils Relative: 1 %
HEMATOCRIT: 25 % — AB (ref 36.0–46.0)
HEMOGLOBIN: 8 g/dL — AB (ref 12.0–15.0)
LYMPHS PCT: 27 %
Lymphs Abs: 0.2 10*3/uL — ABNORMAL LOW (ref 0.7–4.0)
MCH: 26.7 pg (ref 26.0–34.0)
MCHC: 32 g/dL (ref 30.0–36.0)
MCV: 83.3 fL (ref 78.0–100.0)
MONOS PCT: 1 %
Monocytes Absolute: 0 10*3/uL — ABNORMAL LOW (ref 0.1–1.0)
NEUTROS ABS: 0.7 10*3/uL — AB (ref 1.7–7.7)
NEUTROS PCT: 70 %
Platelets: 79 10*3/uL — ABNORMAL LOW (ref 150–400)
RBC: 3 MIL/uL — AB (ref 3.87–5.11)
RDW: 20.9 % — ABNORMAL HIGH (ref 11.5–15.5)
WBC: 0.9 10*3/uL — AB (ref 4.0–10.5)

## 2016-08-28 LAB — I-STAT CG4 LACTIC ACID, ED: LACTIC ACID, VENOUS: 1.02 mmol/L (ref 0.5–1.9)

## 2016-08-28 LAB — GLUCOSE, CAPILLARY
GLUCOSE-CAPILLARY: 92 mg/dL (ref 65–99)
GLUCOSE-CAPILLARY: 95 mg/dL (ref 65–99)

## 2016-08-28 LAB — LIPASE, BLOOD: LIPASE: 29 U/L (ref 11–51)

## 2016-08-28 LAB — MRSA PCR SCREENING: MRSA BY PCR: NEGATIVE

## 2016-08-28 LAB — TSH: TSH: 0.778 u[IU]/mL (ref 0.350–4.500)

## 2016-08-28 LAB — BRAIN NATRIURETIC PEPTIDE: B NATRIURETIC PEPTIDE 5: 61 pg/mL (ref 0.0–100.0)

## 2016-08-28 MED ORDER — DOCUSATE SODIUM 100 MG PO CAPS
100.0000 mg | ORAL_CAPSULE | Freq: Two times a day (BID) | ORAL | Status: DC
  Administered 2016-08-28 – 2016-09-05 (×12): 100 mg via ORAL
  Filled 2016-08-28 (×13): qty 1

## 2016-08-28 MED ORDER — ONDANSETRON HCL 4 MG PO TABS: 4.0000 mg | ORAL_TABLET | Freq: Four times a day (QID) | ORAL | Status: DC | PRN

## 2016-08-28 MED ORDER — VANCOMYCIN HCL IN DEXTROSE 750-5 MG/150ML-% IV SOLN
750.0000 mg | Freq: Two times a day (BID) | INTRAVENOUS | Status: DC
  Administered 2016-08-29 – 2016-08-31 (×5): 750 mg via INTRAVENOUS
  Filled 2016-08-28 (×12): qty 150

## 2016-08-28 MED ORDER — INSULIN ASPART 100 UNIT/ML ~~LOC~~ SOLN: 0.0000 [IU] | Freq: Every day | SUBCUTANEOUS | Status: DC

## 2016-08-28 MED ORDER — PIPERACILLIN-TAZOBACTAM 3.375 G IVPB
3.3750 g | Freq: Three times a day (TID) | INTRAVENOUS | Status: DC
  Administered 2016-08-28 – 2016-09-04 (×20): 3.375 g via INTRAVENOUS
  Filled 2016-08-28 (×18): qty 50

## 2016-08-28 MED ORDER — TBO-FILGRASTIM 480 MCG/0.8ML ~~LOC~~ SOSY
300.0000 ug | PREFILLED_SYRINGE | Freq: Once | SUBCUTANEOUS | Status: DC
  Filled 2016-08-28: qty 0.8

## 2016-08-28 MED ORDER — SODIUM CHLORIDE 0.9 % IV SOLN
1250.0000 mg | Freq: Once | INTRAVENOUS | Status: AC
  Administered 2016-08-28: 1250 mg via INTRAVENOUS
  Filled 2016-08-28: qty 1250

## 2016-08-28 MED ORDER — ONDANSETRON HCL 4 MG/2ML IJ SOLN
4.0000 mg | Freq: Four times a day (QID) | INTRAMUSCULAR | Status: DC | PRN
Start: 2016-08-28 — End: 2016-09-06

## 2016-08-28 MED ORDER — IOPAMIDOL (ISOVUE-370) INJECTION 76%
75.0000 mL | Freq: Once | INTRAVENOUS | Status: AC | PRN
  Administered 2016-08-28: 75 mL via INTRAVENOUS

## 2016-08-28 MED ORDER — PANTOPRAZOLE SODIUM 40 MG PO TBEC
40.0000 mg | DELAYED_RELEASE_TABLET | Freq: Every day | ORAL | Status: DC
  Administered 2016-08-29 – 2016-09-06 (×9): 40 mg via ORAL
  Filled 2016-08-28 (×9): qty 1

## 2016-08-28 MED ORDER — MORPHINE SULFATE ER 15 MG PO TBCR
15.0000 mg | EXTENDED_RELEASE_TABLET | Freq: Two times a day (BID) | ORAL | Status: DC
  Administered 2016-08-28 – 2016-09-06 (×18): 15 mg via ORAL
  Filled 2016-08-28 (×18): qty 1

## 2016-08-28 MED ORDER — ENOXAPARIN SODIUM 40 MG/0.4ML ~~LOC~~ SOLN
40.0000 mg | SUBCUTANEOUS | Status: DC
  Administered 2016-08-28: 40 mg via SUBCUTANEOUS
  Filled 2016-08-28: qty 0.4

## 2016-08-28 MED ORDER — SODIUM CHLORIDE 0.9% FLUSH
3.0000 mL | Freq: Two times a day (BID) | INTRAVENOUS | Status: DC
  Administered 2016-08-29 – 2016-09-06 (×14): 3 mL via INTRAVENOUS

## 2016-08-28 MED ORDER — VANCOMYCIN HCL IN DEXTROSE 1-5 GM/200ML-% IV SOLN: 1000.0000 mg | Freq: Once | INTRAVENOUS | Status: DC

## 2016-08-28 MED ORDER — ACETAMINOPHEN 650 MG RE SUPP: 650.0000 mg | Freq: Four times a day (QID) | RECTAL | Status: DC | PRN

## 2016-08-28 MED ORDER — ADULT MULTIVITAMIN W/MINERALS CH
1.0000 | ORAL_TABLET | Freq: Every day | ORAL | Status: DC
  Administered 2016-08-29 – 2016-09-06 (×9): 1 via ORAL
  Filled 2016-08-28 (×9): qty 1

## 2016-08-28 MED ORDER — ALBUTEROL SULFATE (2.5 MG/3ML) 0.083% IN NEBU
5.0000 mg | INHALATION_SOLUTION | Freq: Once | RESPIRATORY_TRACT | Status: AC
  Administered 2016-08-28: 5 mg via RESPIRATORY_TRACT
  Filled 2016-08-28: qty 6

## 2016-08-28 MED ORDER — POLYETHYLENE GLYCOL 3350 17 G PO PACK
17.0000 g | PACK | Freq: Every day | ORAL | Status: DC
  Administered 2016-08-29 – 2016-09-05 (×6): 17 g via ORAL
  Filled 2016-08-28 (×6): qty 1

## 2016-08-28 MED ORDER — PIPERACILLIN-TAZOBACTAM 3.375 G IVPB 30 MIN
3.3750 g | Freq: Once | INTRAVENOUS | Status: DC
  Filled 2016-08-28: qty 50

## 2016-08-28 MED ORDER — SODIUM CHLORIDE 0.9 % IV SOLN
INTRAVENOUS | Status: DC
  Administered 2016-08-28 – 2016-08-31 (×6): via INTRAVENOUS

## 2016-08-28 MED ORDER — HYDROCODONE-ACETAMINOPHEN 5-325 MG PO TABS
1.0000 | ORAL_TABLET | ORAL | Status: DC | PRN
  Administered 2016-08-28 – 2016-09-06 (×13): 1 via ORAL
  Filled 2016-08-28 (×14): qty 1

## 2016-08-28 MED ORDER — INSULIN ASPART 100 UNIT/ML ~~LOC~~ SOLN
0.0000 [IU] | Freq: Three times a day (TID) | SUBCUTANEOUS | Status: DC
  Administered 2016-09-04: 1 [IU] via SUBCUTANEOUS

## 2016-08-28 MED ORDER — ACETAMINOPHEN 325 MG PO TABS
650.0000 mg | ORAL_TABLET | Freq: Four times a day (QID) | ORAL | Status: DC | PRN
  Administered 2016-08-30 – 2016-09-04 (×5): 650 mg via ORAL
  Filled 2016-08-28 (×5): qty 2

## 2016-08-28 MED ORDER — PRAVASTATIN SODIUM 10 MG PO TABS
20.0000 mg | ORAL_TABLET | Freq: Every day | ORAL | Status: DC
Start: 2016-08-28 — End: 2016-09-06
  Administered 2016-08-28 – 2016-09-05 (×9): 20 mg via ORAL
  Filled 2016-08-28 (×10): qty 2

## 2016-08-28 MED ORDER — SODIUM CHLORIDE 0.9 % IV BOLUS (SEPSIS)
1000.0000 mL | Freq: Once | INTRAVENOUS | Status: AC
  Administered 2016-08-28: 1000 mL via INTRAVENOUS

## 2016-08-28 MED ORDER — KETOROLAC TROMETHAMINE 30 MG/ML IJ SOLN: 30.0000 mg | Freq: Four times a day (QID) | INTRAMUSCULAR | Status: DC | PRN

## 2016-08-28 NOTE — ED Notes (Signed)
Assessed port prior to accessing. Three raised nodules of power port not palpated by two ER staff. EDP aware power port may have "flipped". Pt reports last accessed x1 week ago. Able to obtain IV access in left [facility]. Pt tolerated well.

## 2016-08-28 NOTE — Progress Notes (Signed)
Pharmacy Antibiotic Note  Evanell Redlich is a 56 y.o. female admitted on 08/28/2016 with sepsis.  Pharmacy has been consulted for vancomycin and zosyn dosing.  Plan: Vancomycin 1250mg  loading dose, then750mg   IV every 12 hours.  Goal trough 15-20 mcg/mL. Zosyn 3.375g IV q8h (4 hour infusion).  F/U cxs and clinical progress  Monitor V/S, labs, and levels as indicated  Height: 5\' 4"  (162.6 cm) Weight: 125 lb 14.1 oz (57.1 kg) IBW/kg (Calculated) : 54.7  Temp (24hrs), Avg:98.5 F (36.9 C), Min:98.4 F (36.9 C), Max:98.7 F (37.1 C)   Recent Labs Lab 08/28/16 1159 08/28/16 1322  WBC 0.9*  --   CREATININE 0.66  --   LATICACIDVEN  --  1.02    Estimated Creatinine Clearance: 67.8 mL/min (by C-G formula based on SCr of 0.66 mg/dL).    Allergies  Allergen Reactions  . Iron Nausea And Vomiting    Antimicrobials this admission: Vancomycin 7/17 >>  Zosyn 7/17 >>   Dose adjustments this admission: N/A  Microbiology results: 7/17 BCx: pending 7/17 UCx: pending  Thank you for allowing pharmacy to be a part of this patient's care.  Isac Sarna, BS [place], [place] Clinical Pharmacist Pager [phone] 08/28/2016 4:42 PM

## 2016-08-28 NOTE — H&P (Addendum)
History and Physical:    Heather Hinton   XIP:[id number] DOB: Jul 15, 1960 DOA: 08/28/2016  Referring MD/provider: Margette Fast, MD PCP: Neale Burly, MD   Patient coming from: Home  Chief Complaint: Shortness of breath  History of Present Illness:   Heather Hinton is an 56 y.o. female with a PMH of metastatic lung cancer diagnosed 07/2016 when she presented with weakness, left hip pain, fever and chills. CT imaging done 07/14/16 showed a right middle lobe mass lesion with associated hilar and mediastinal adenopathy and near complete right middle lobe collapse. She underwent soft tissue needle core biopsy to her left ischium on 07/16/16 which showed metastatic poorly differentiated carcinoma. Subsequent MRI of the brain showed 6 enhancing lesions with surrounding vasogenic edema. On 07/18/16 she received radiation treatment to her brain, chest and bone. A Port-A-Cath was placed on 08/20/16 and she began palliative chemotherapy with carboplatin and Alimta on 08/21/16. She follows with Dr. Alvy Bimler. Other medical history includes hypertension and spinal stenosis. Presents today for chief complaint of 24 hour history of shortness of breath, worse with any activity. Denies any associated chest pain, cough, or fever. Nothing makes the problem better.  ED Course:  In the ED, she was significantly tachycardic and neutropenic and given her history of recent chemotherapy, has been referred for inpatient evaluation and treatment.   ROS:   Review of Systems  Constitutional: Positive for malaise/fatigue and weight loss. Negative for chills and fever.  HENT: Negative.   Eyes: Negative.   Respiratory: Positive for shortness of breath. Negative for cough, hemoptysis, sputum production and wheezing.   Cardiovascular: Negative for chest pain, palpitations, orthopnea, claudication, leg swelling and PND.  Gastrointestinal: Positive for constipation. Negative for abdominal pain, blood in stool, diarrhea, melena,  nausea and vomiting.  Genitourinary: Negative.   Musculoskeletal: Positive for myalgias.  Skin: Negative.   Neurological: Positive for dizziness and weakness.  Endo/Heme/Allergies: Negative.   Psychiatric/Behavioral: Positive for depression. Negative for suicidal ideas.    Past Medical History:   Past Medical History:  Diagnosis Date  . B12 deficiency anemia 07/30/2016  . Hyperlipidemia   . Hypertension   . Iron deficiency anemia   . Lung cancer ([place])   . Metastasis to bone ([place])   . Metastasis to brain ([place])   . Severe protein-calorie malnutrition ([place]) 08/23/2016  . Spinal stenosis     Past Surgical History:   Past Surgical History:  Procedure Laterality Date  . IR FLUORO GUIDE PORT INSERTION RIGHT  08/20/2016  . IR US GUIDE VASC ACCESS RIGHT  08/20/2016  . TUBAL LIGATION      Social History:   Social History   Social History  . Marital status: Legally Separated    Spouse name: N/A  . Number of children: 53  . Years of education: N/A   Occupational History  . Unemployed    Social History Main Topics  . Smoking status: Former Smoker    Packs/day: 0.50    Years: 40.00    Types: Cigarettes    Quit date: 10/2015  . Smokeless tobacco: Never Used  . Alcohol use No  . Drug use: No  . Sexual activity: Yes   Other Topics Concern  . Not on file   Social History Narrative   Lives with husband.    Allergies   Iron  Family history:   Family History  Problem Relation Age of Onset  . Hypertension Mother   . Diabetes Mother   . Breast cancer  Mother   . Hypertension Father   . Hypertension Sister   . Pancreatic cancer Sister   . Lung cancer Maternal Grandmother     Current Medications:   Prior to Admission medications   Medication Sig Start Date End Date Taking? Authorizing Provider  dexamethasone (DECADRON) 4 MG tablet Take 1 tablet (4 mg total) by mouth every 8 (eight) hours. 07/23/16  Yes Theodis Blaze, MD  dexamethasone (DECADRON) 4 MG tablet Take 1  tab two times a day the day before Alimta chemo. Take 2 tabs two times a day starting the day after chemo for 3 days. 08/21/16  Yes Heath Lark, MD  HYDROcodone-acetaminophen (NORCO/VICODIN) 5-325 MG tablet Take 1 tablet by mouth every 4 (four) hours as needed for moderate pain or severe pain. 08/21/16  Yes Gorsuch, Ni, MD  lidocaine-prilocaine (EMLA) cream Apply 1 application topically as needed. 07/30/16  Yes Gorsuch, Ni, MD  lovastatin (MEVACOR) 20 MG tablet Take 20 mg by mouth at bedtime.   Yes [provider]  morphine (MS CONTIN) 15 MG 12 hr tablet Take 1 tablet (15 mg total) by mouth every 12 (twelve) hours. 08/02/16  Yes Heath Lark, MD  Multiple Vitamin (MULTIVITAMIN WITH MINERALS) TABS tablet Take 1 tablet by mouth daily.   Yes [provider]  ondansetron (ZOFRAN) 8 MG tablet Take 1 tablet (8 mg total) by mouth 2 (two) times daily as needed for refractory nausea / vomiting. Start on day 3 after chemo. 08/21/16  Yes Gorsuch, Ni, MD  pantoprazole (PROTONIX) 40 MG tablet Take 1 tablet (40 mg total) by mouth daily. 08/17/16  Yes Gorsuch, Ni, MD  polyethylene glycol (MIRALAX / GLYCOLAX) packet Take 17 g by mouth daily. 07/24/16  Yes Theodis Blaze, MD  prochlorperazine (COMPAZINE) 10 MG tablet Take 1 tablet (10 mg total) by mouth every 6 (six) hours as needed (Nausea or vomiting). 08/21/16  Yes Gorsuch, Ni, MD  senna-docusate (SENOKOT-S) 8.6-50 MG tablet Take 2 tablets by mouth 2 (two) times daily. Patient not taking: Reported on 08/28/2016 07/23/16   Theodis Blaze, MD    Physical Exam:   Vitals:   08/28/16 1330 08/28/16 1400 08/28/16 1430 08/28/16 1500  BP: 135/63 122/68 (!) 119/54 123/63  Pulse: (!) 119 (!) 115 (!) 121 (!) 118  Resp: (!) 22 (!) 21 19 (!) 22  Temp:      TempSrc:      SpO2: 100% 100% 100% 99%  Weight:      Height:         Physical Exam: Blood pressure 123/63, pulse (!) 118, temperature 98.5 F (36.9 C), temperature source Oral, resp. rate (!) 22, height  5\' 4"  (1.626 m), weight 59.9 kg (132 lb), SpO2 99 %. Gen: Moderate respiratory distress. Chronically ill appearing. Head: Normocephalic, atraumatic. Eyes: Pupils equal, round and reactive to light. Small cyst below right eye. Extraocular movements intact.  Sclerae nonicteric. No lid lag. Mouth: Oropharynx reveals fair dentition and dry mucous membranes. No thrush. Neck: Supple, no thyromegaly, no lymphadenopathy, no jugular venous distention. Chest: Lungs are diminished bilaterally with tachypnea and increased work of breathing. No rales, rhonchi or wheezes.  CV: Heart sounds are regular and tachycardic with a grade 2 systolic ejection murmur. Abdomen: Soft, nontender, nondistended with normal active bowel sounds. No hepatosplenomegaly or palpable masses. Extremities: Extremities are without clubbing, or cyanosis. No edema. Pedal pulses 2+.  Skin: Warm and dry. Significant ecchymosis around right chest wall Port-A-Cath site, but nontender to palpation. Neuro: Alert  and oriented times 3; grossly nonfocal.  Psych: Insight is good and judgment is fair. Mood and affect anxious/flat.   Data Review:    Labs: Chemistries show a slightly low sodium at 133, chloride 95 and a glucose of 184. LFTs show a slightly high alkaline phosphatase at 149, albumin low at 2.4. Lipase is WNL. WBC is 0.9 within absolute neutrophil count of 0.7, hemoglobin 8, hematocrit 25, platelet count 79. Urinalysis is negative for nitrites and leukocyte esterase. Lactic acid is 1.02. Troponin is 0.05.  Radiographic Studies:  Dg Chest 2 View 08/28/2016: Lymphadenopathy in the right hilum and mediastinum: No pulmonary emboli.     Ct Angio Chest Pe W And/or Wo Contrast 08/28/2016: Middle lobe mass with evidence of metastasis in the thoracic spine and axillary areas bilaterally, left paraspinal area, and subcutaneous tissues in the posterior chest wall. Lymphadenopathy. No compression of the trachea or mainstem bronchi  noted.   EKG: Independently reviewed. Sinus tachycardia at 119 bpm with a LBBB. No ischemic changes.   Assessment/Plan:   Principal Problem:   SIRS (systemic inflammatory response syndrome) (HCC) rule out sepsis in a patient with neutropenia status post systemic chemotherapy Patient was placed on empiric vancomycin and Zosyn. Blood cultures 2 and urine culture sent. Urinalysis negative for signs of infection. Chest x-ray negative for infiltrates. CXR negative for infiltrates. Lactic acid normal which is reassuring. Although tachycardic and tachypneic, there is no hypoxia or elevated cardiac enzymes.    Sinus tachycardia Pulmonary embolism ruled out. May be from sepsis, anemia. Rule out hyperthyroidism, check TSH.  Active Problems:   Metastatic lung cancer (metastasis from lung to other site) (HCC)/bone and brain metastasis s/p systemic chemotherapy resulting in pancytopenia Status post systemic chemotherapy with carboplatin and Alimta on 08/21/16. Will request oncology consultation to determine if there is a role for colony-stimulating factors.    Hyperglycemia Not known to be diabetic. May be from recent steroid use prior to and following chemotherapy. Check hemoglobin A1c and place on insulin sensitive SSI.    Essential hypertension Blood pressure currently controlled. Not on antihypertensives at home.    Symptomatic anemia/iron deficiency anemia/anemia related to chemotherapy/B 12 deficiency anemia Hemoglobin 8. Given dyspnea, may benefit from a blood transfusion.    Cancer associated pain Continue home pain medications.    Severe protein-calorie malnutrition (HCC) Continue multivitamin.  Body mass index is 22.66 kg/m.    HIV screening Done 07/15/16: Nonreactive   Other information:   DVT prophylaxis: Lovenox ordered. Code Status: Full code. Family Communication: Sister at bedside.   Disposition Plan: Home when hemodynamically stable and sepsis ruled out.  Consults  called: Oncology  Admission status:  Inpatient   The medical decision making on this patient was of high complexity and the patient is at high risk for clinical deterioration, therefore this is a level 3 visit.   Attestation regarding necessity of inpatient status:   The appropriate admission status for this patient is INPATIENT. Inpatient status is judged to be reasonable and necessary in order to provide the required intensity of service to ensure the patient's safety. The patient's presenting symptoms, physical exam findings, and initial radiographic and laboratory data in the context of their chronic comorbidities is felt to place them at high risk for further clinical deterioration. Furthermore, it is not anticipated that the patient will be medically stable for discharge from the hospital within 2 midnights of admission. The following factors support the admission status of inpatient.    The patient's presenting symptoms include  shortness of breath, weakness, dizziness.  The worrisome physical exam findings include tachypnea, tachycardia, ill appearing.  The initial radiographic and laboratory data are worrisome because of extensive lung cancer, new hyperglycemia, panctopenia.  The chronic co-morbidities include severe protein calorie malnutrition.   * I certify that at the point of admission it is my clinical judgment that the patient will require inpatient hospital care spanning beyond 2 midnights from the point of admission due to high intensity of service, high risk for further deterioration and high frequency of surveillance required.*    RAMA,CHRISTINA [facility] Pager [phone] Cell: [phone]   If 7PM-7AM, please contact night-coverage [email or web address] Password [email or web address] 08/28/2016, 3:24 PM

## 2016-08-28 NOTE — ED Triage Notes (Signed)
Patient report of shortness of breath changing positions. Has brain, lung and leg cancer. Received first treatment of chemo Tuesday, has another 09/11/16. Denies pain. Heart rate 141. EDP. Dr. Laverta Baltimore made aware.

## 2016-08-28 NOTE — ED Notes (Signed)
Date and time results received: 08/28/16 1248   Test: WBC Critical Value: .9  Name of Provider Notified: Dr .Lita Mains  Orders Received? Or Actions Taken?: No new orders given at this time.

## 2016-08-28 NOTE — ED Notes (Signed)
EKG given to Dr. Long 

## 2016-08-28 NOTE — Progress Notes (Signed)
Pharmacy Note:  Initial antibiotics for sepsis ordered by EDP.  Estimated Creatinine Clearance: 73.1 mL/min (by C-G formula based on SCr of 0.66 mg/dL).   Allergies  Allergen Reactions  . Iron Nausea And Vomiting    Vitals:   08/28/16 1136 08/28/16 1200  BP: (!) 146/116 (!) 127/49  Pulse: (S) (!) 141 (!) 130  Resp: (!) 22 (!) 23  Temp: 98.5 F (36.9 C)     Anti-infectives    Start     Dose/Rate Route Frequency Ordered Stop   08/28/16 1300  piperacillin-tazobactam (ZOSYN) IVPB 3.375 g     3.375 g 100 mL/hr over 30 Minutes Intravenous  Once 08/28/16 1246     08/28/16 1300  vancomycin (VANCOCIN) IVPB 1000 mg/200 mL premix  Status:  Discontinued     1,000 mg 200 mL/hr over 60 Minutes Intravenous  Once 08/28/16 1246 08/28/16 1249   08/28/16 1300  vancomycin (VANCOCIN) 1,250 mg in sodium chloride 0.9 % 250 mL IVPB     1,250 mg 166.7 mL/hr over 90 Minutes Intravenous  Once 08/28/16 1249        Plan: Initial doses of vancomycin and zosyn X 1 ordered. F/U admission orders for further dosing if therapy continued.  Cristy Friedlander, [facility] 08/28/2016 12:50 PM

## 2016-08-28 NOTE — ED Notes (Addendum)
2nd blood culture obtained and pt taken to CT. Abx will be restarted at time of pt return.

## 2016-08-28 NOTE — ED Provider Notes (Signed)
Emergency Department Provider Note   I have reviewed the triage vital signs and the nursing notes.   HISTORY  Chief Complaint Shortness of Breath and Tachycardia   HPI Heather Hinton is a 56 y.o. 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 Patient was seen in the emergency pertinent several days ago for fever of unknown origin. She recently had a port placed for chemotherapy and has received her first round of Carboplatin and Alimta. She denies any chest pain. No shortness of breath at rest. Yesterday she noticed severe dyspnea with even short distance ambulation. No associated vomiting or diarrhea. No abdominal discomfort. No back discomfort. No productive cough. No sick contacts. No blood in the emesis or stool.    Past Medical History:  Diagnosis Date  . Hypertension   . Lung cancer ([place])   . Spinal stenosis     Patient Active Problem List   Diagnosis Date Noted  . Severe protein-calorie malnutrition ([place]) 08/23/2016  . Goals of care, counseling/discussion 07/31/2016  . B12 deficiency anemia 07/30/2016  . Iron deficiency anemia   . Metastasis to brain ([place])   . 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 Metastasis to bone ([place])   . Left hip pain   . Palliative care by specialist   . Metastatic lung cancer (metastasis from lung to other site) ([place]) 07/14/2016  . Essential hypertension 07/14/2016  . Hypokalemia 07/14/2016  . Hyperglycemia 07/14/2016  . Symptomatic anemia 07/14/2016    Past Surgical History:  Procedure Laterality Date  . IR FLUORO GUIDE PORT INSERTION RIGHT  08/20/2016  . IR US GUIDE VASC ACCESS RIGHT  08/20/2016  . TUBAL LIGATION      Current Outpatient Rx  . Order #: [id number] Class: Normal  . Order #: [id number] Class: Normal  . Order #: [id number] Class: Print  . Order #:  [id number] Class: Normal  . Order #: [id number] Class: Historical Med  . Order #: [id number] Class: Print  . Order #: [id number] Class: Historical Med  . Order #: [id number] Class: Normal  . Order #: [id number] Class: Normal  . Order #: [id number] Class: Normal  . Order #: [id number] Class: Normal  . Order #: [id number] Class: No Print    Allergies Iron  Family History  Problem Relation Age of Onset  . Hypertension Mother   . Diabetes Mother   . Breast cancer Mother   . Hypertension Father   . Hypertension Sister   . Pancreatic cancer Sister   . Lung cancer Maternal Grandmother     Social History Social History  Substance Use Topics  . Smoking status: Former Smoker    Packs/day: 0.50    Years: 40.00    Types: Cigarettes    Quit date: 10/2015  . Smokeless tobacco: Never Used  . Alcohol use No    Review of Systems  Constitutional: No fever/chills. Positive generalized weakness.  Eyes: No visual changes. ENT: No sore throat. Cardiovascular: Denies chest pain. Respiratory: Positive shortness of breath. Gastrointestinal: No abdominal pain.  No nausea, no vomiting.  No diarrhea.  No constipation. Genitourinary: Negative for dysuria. Musculoskeletal: Negative for back pain. Skin: Negative for rash. Neurological: Negative for headaches, focal weakness or numbness.   10-point ROS otherwise negative.  ____________________________________________   PHYSICAL EXAM:  VITAL SIGNS: ED Triage Vitals  Enc Vitals Group     BP 08/28/16 1134 (!) 146/116     Pulse Rate 08/28/16  1134 (!) 145     Resp 08/28/16 1136 (!) 22     Temp 08/28/16 1136 98.5 F (36.9 C)     Temp Source 08/28/16 1136 Oral     SpO2 08/28/16 1134 100 %     Weight 08/28/16 1137 130 lb (59 kg)     Height 08/28/16 1137 5\' 7"  (1.702 m)     Pain Score 08/28/16 1135 0   Constitutional: Alert and oriented. Chronically ill-appearing.  Eyes: Conjunctivae are normal. . Head: Atraumatic. Nose: No  congestion/rhinnorhea. Mouth/Throat: Mucous membranes are dry.  Neck: No stridor.   Cardiovascular: Sinus tachycardia. Good peripheral circulation. Grossly normal heart sounds.   Respiratory: Normal respiratory effort.  No retractions. Lungs with course sounds throughout.  Gastrointestinal: Soft and nontender. No distention.  Musculoskeletal: No lower extremity tenderness nor edema. No gross deformities of extremities. Neurologic:  Normal speech and language. No gross focal neurologic deficits are appreciated.  Skin:  Skin is warm, dry and intact. No rash noted. Psychiatric: Mood and affect are normal. Speech and behavior are normal.  ____________________________________________   LABS (all labs ordered are listed, but only abnormal results are displayed)  Labs Reviewed  COMPREHENSIVE METABOLIC PANEL - Abnormal; Notable for the following:       Result Value   Sodium 133 (*)    Chloride 95 (*)    Glucose, Bld 184 (*)    Albumin 2.4 (*)    Alkaline Phosphatase 149 (*)    All other components within normal limits  CBC WITH DIFFERENTIAL/PLATELET - Abnormal; Notable for the following:    WBC 0.9 (*)    RBC 3.00 (*)    Hemoglobin 8.0 (*)    HCT 25.0 (*)    RDW 20.9 (*)    Platelets 79 (*)    Neutro Abs 0.7 (*)    Lymphs Abs 0.2 (*)    Monocytes Absolute 0.0 (*)    All other components within normal limits  CULTURE, BLOOD (ROUTINE X 2)  CULTURE, BLOOD (ROUTINE X 2)  URINE CULTURE  LIPASE, BLOOD  URINALYSIS, ROUTINE W REFLEX MICROSCOPIC  I-STAT TROPOININ, ED  I-STAT CG4 LACTIC ACID, ED   ____________________________________________  EKG   EKG Interpretation  Date/Time:  Tuesday August 28 2016 11:36:32 EDT Ventricular Rate:  138 PR Interval:    QRS Duration: 129 QT Interval:  320 QTC Calculation: 485 R Axis:   30 Text Interpretation:  Sinus tachycardia Left bundle branch block Similar to prior. No STEMI.  Confirmed by Nanda Quinton [phone]) on 08/28/2016 11:38:28 AM        ____________________________________________  RADIOLOGY  Dg Chest 2 View  Result Date: 08/28/2016 CLINICAL DATA:  56 year old female with increasing shortness of breath since yesterday. History of lung cancer on chemotherapy. EXAM: CHEST  2 VIEW COMPARISON:  Chest x-ray 08/20/2016. FINDINGS: Right internal jugular power porta cath with tip terminating in the mid superior vena cava. No acute consolidative airspace disease. No pleural effusions. No evidence of pulmonary edema. Heart size is normal. Prominence of the right paratracheal soft tissues and right hilar soft tissues, similar to prior examination. Lateral projection also demonstrates a prominent anterior mediastinal mass, similar to the prior study. IMPRESSION: 1. No radiographic evidence of acute cardiopulmonary disease. 2. Right hilar and mediastinal lymphadenopathy, similar to the prior examination. Electronically Signed   By: Vinnie Langton M.D.   On: 08/28/2016 12:56   Ct Angio Chest Pe W And/or Wo Contrast  Result Date: 08/28/2016 CLINICAL DATA:  History of lung  carcinoma with shortness of breath, initial encounter EXAM: CT ANGIOGRAPHY CHEST WITH CONTRAST TECHNIQUE: Multidetector CT imaging of the chest was performed using the standard protocol during bolus administration of intravenous contrast. Multiplanar CT image reconstructions and MIPs were obtained to evaluate the vascular anatomy. CONTRAST:  75 mL Isovue 370. COMPARISON:  07/14/2016 FINDINGS: Cardiovascular: Thoracic aorta is well visualize without significant atherosclerotic calcifications or aneurysmal dilatation. No dissection is identified. No significant cardiac enlargement is seen. No coronary calcifications are noted. The pulmonary artery demonstrates a normal branching pattern. No definitive filling defect is identified to suggest pulmonary embolism. Mediastinum/Nodes: Soft tissue density is noted in the right supraclavicular region which measures 3.4 by 2.5 cm. This  was not as well evaluated on the prior CT examination is consistent with nodal metastatic disease. Right chest wall port is now seen in satisfactory position. Considerable right paratracheal and pretracheal adenopathy is noted this demonstrates some decrease in size now measuring approximately 4 cm in AP dimension previously measuring 5.2 cm. Additionally subcarinal and right hilar adenopathy is noted. These are relatively stable from the prior exam. Anterior mediastinal adenopathy is also noted stable from the prior exam. The esophagus as visualized is within normal limits. Prominent lymph nodes are noted in the left axilla largest of these measures approximately 13 cm in short axis enlarged from previous exam. Subcutaneous lesion in the right axilla is noted measuring 16 mm best seen on image number 86 of series 5 which was not as well appreciated on the prior exam due to the imaging technique. Lungs/Pleura: Diffuse emphysematous changes are identified. The left lung is well aerated without focal infiltrate or sizable effusion. The previously seen soft tissue lesion along the medial aspect of the left lower lobe is again identified and appears enlarged now measuring 2.7 x 2.2 cm. It previously measured 1.8 x 1.0 cm. This is again felt to be arising from the chest wall and consistent with a focal metastatic lesion. Right middle lobe mass lesion is again identified but appears slightly smaller now measuring 1.8 cm in greatest dimension. This is decreased from 2.8 cm on the prior exam. The degree of right middle lobe collapse has improved when compared with the prior exam. The bronchial tree is now patent extending into the right middle lobe. Upper Abdomen: Few scattered hyperdensities are noted within the liver likely representing hemangiomas. There stable from the prior exam. Enlargement of both adrenal glands is now seen. The right adrenal gland measures approximately 2.9 cm in greatest dimension. The left adrenal  gland measures approximately 3.0 cm in greatest dimension. Given the findings in the chest this likely represents focal metastatic disease and is new from the prior exam. Few scattered small rounded densities are noted within the omentum which may represent peritoneal metastatic disease. Irregularity of the upper pole of the right kidney remains. Musculoskeletal: Scattered lucencies are noted within the thoracic spine as well as within the ribcage consistent with metastatic disease. This is new from the prior exam bone scan may be helpful for further evaluation as clinically indicated. The most prominent of these lesions lies in the left half of the T9 vertebral body. Better visualized on the current examination is a 3.4 cm soft tissue lesion within the subcutaneous fat just to the right of the midline in the midthoracic spine along the paraspinal musculature. A few smaller subcutaneous lesions are noted in the posterior chest wall. Review of the MIP images confirms the above findings. IMPRESSION: No evidence of pulmonary embolism. Findings consistent  with the patient's known history of lung carcinoma with metastatic disease. The overall size of the right middle lobe mass lesion has decreased in the interval from the prior exam. There has been some increase in metastatic disease particularly in the thoracic spine and axillary regions bilaterally as well as in the left paraspinal region and subcutaneous tissues in the posterior chest wall. Right supraclavicular adenopathy is noted which was not as well visualized on a prior exam due to the imaging technique. Some decrease is noted within the pretracheal and peritracheal adenopathy when compared with the prior exam. Emphysema (ICD10-J43.9). Electronically Signed   By: Inez Catalina M.D.   On: 08/28/2016 14:04    ____________________________________________   PROCEDURES  Procedure(s) performed:   Procedures  CRITICAL CARE Performed by: Margette Fast Total  critical care time: 30 minutes Critical care time was exclusive of separately billable procedures and treating other patients. Critical care was necessary to treat or prevent imminent or life-threatening deterioration. Critical care was time spent personally by me on the following activities: development of treatment plan with patient and/or surrogate as well as nursing, discussions with consultants, evaluation of patient's response to treatment, examination of patient, obtaining history from patient or surrogate, ordering and performing treatments and interventions, ordering and review of laboratory studies, ordering and review of radiographic studies, pulse oximetry and re-evaluation of patient's condition.  Nanda Quinton, MD Emergency Medicine  ____________________________________________   INITIAL IMPRESSION / ASSESSMENT AND PLAN / ED COURSE  Pertinent labs & imaging results that were available during my care of the patient were reviewed by me and considered in my medical decision making (see chart for details).  Patient resents to the emergency department for evaluation of exertional dyspnea. She has significant sinus tachycardia with baseline left bundle branch block. No chest pain. Given the relatively sudden onset in symptoms my differential is broad and includes superimposed infection, pleural effusion, or edema, and/or pulmonary embolism. Plan for labs and plain film. No O2 requirement at this time.   12:40 PM Patient with very low WBC count. Absolute neutrophils pending. Hb also down-trending. Adding abx in the setting of tachycardia and concern for developing neutropenia in the setting of recent chemotherapy. No fever here in the ED but was seen recently in the ED with fever.   01:15 PM Updated patient regarding CXR and lab results. No fever today. HR down-trending with IVF. No acute distress.   02:45 PM CT with no evidence of PE or infection. Unclear with dyspnea is 2/2  symptomatic anemia. No ECHO in records available to me.   Discussed patient's case with Hospitalist, Dr. Rockne Menghini. I requested admission.  Patient and family (if present) updated with plan. Care transferred to Hospitalist service.  I reviewed all nursing notes, vitals, pertinent old records, EKGs, labs, imaging (as available).  ____________________________________________  FINAL CLINICAL IMPRESSION(S) / ED DIAGNOSES  Final diagnoses:  Shortness of breath  Tachycardia  Anemia, unspecified type     MEDICATIONS GIVEN DURING THIS VISIT:  Medications  piperacillin-tazobactam (ZOSYN) IVPB 3.375 g (0 g Intravenous Stopped 08/28/16 1438)  vancomycin (VANCOCIN) 1,250 mg in sodium chloride 0.9 % 250 mL IVPB (1,250 mg Intravenous New Bag/Given 08/28/16 1358)  albuterol (PROVENTIL) (2.5 MG/3ML) 0.083% nebulizer solution 5 mg (5 mg Nebulization Given 08/28/16 1307)  sodium chloride 0.9 % bolus 1,000 mL (0 mLs Intravenous Stopped 08/28/16 1437)  iopamidol (ISOVUE-370) 76 % injection 75 mL (75 mLs Intravenous Contrast Given 08/28/16 1339)     NEW OUTPATIENT MEDICATIONS STARTED  DURING THIS VISIT:  None   Note:  This document was prepared using Dragon voice recognition software and may include unintentional dictation errors.  Nanda Quinton, MD Emergency Medicine    Xitlally Mooneyham, [facility], MD 08/28/16 1452

## 2016-08-29 ENCOUNTER — Other Ambulatory Visit (HOSPITAL_COMMUNITY): Payer: Self-pay | Admitting: Oncology

## 2016-08-29 ENCOUNTER — Other Ambulatory Visit: Payer: Self-pay | Admitting: Hematology and Oncology

## 2016-08-29 DIAGNOSIS — K922 Gastrointestinal hemorrhage, unspecified: Secondary | ICD-10-CM

## 2016-08-29 LAB — CBC WITH DIFFERENTIAL/PLATELET
BASOS PCT: 2 %
Basophils Absolute: 0 10*3/uL (ref 0.0–0.1)
EOS ABS: 0 10*3/uL (ref 0.0–0.7)
Eosinophils Relative: 0 %
HCT: 23.3 % — ABNORMAL LOW (ref 36.0–46.0)
HEMOGLOBIN: 7.3 g/dL — AB (ref 12.0–15.0)
Lymphocytes Relative: 31 %
Lymphs Abs: 0.2 10*3/uL — ABNORMAL LOW (ref 0.7–4.0)
MCH: 26.3 pg (ref 26.0–34.0)
MCHC: 31.3 g/dL (ref 30.0–36.0)
MCV: 83.8 fL (ref 78.0–100.0)
MONOS PCT: 0 %
Monocytes Absolute: 0 10*3/uL — ABNORMAL LOW (ref 0.1–1.0)
NEUTROS PCT: 67 %
Neutro Abs: 0.5 10*3/uL — ABNORMAL LOW (ref 1.7–7.7)
Platelets: 53 10*3/uL — ABNORMAL LOW (ref 150–400)
RBC: 2.78 MIL/uL — ABNORMAL LOW (ref 3.87–5.11)
RDW: 20.5 % — AB (ref 11.5–15.5)
WBC: 0.7 10*3/uL — CL (ref 4.0–10.5)

## 2016-08-29 LAB — GLUCOSE, CAPILLARY
GLUCOSE-CAPILLARY: 96 mg/dL (ref 65–99)
GLUCOSE-CAPILLARY: 114 mg/dL — AB (ref 65–99)
Glucose-Capillary: 75 mg/dL (ref 65–99)
Glucose-Capillary: 115 mg/dL — ABNORMAL HIGH (ref 65–99)

## 2016-08-29 LAB — PREPARE RBC (CROSSMATCH)

## 2016-08-29 MED ORDER — SODIUM CHLORIDE 0.9 % IV SOLN
Freq: Once | INTRAVENOUS | Status: AC
  Administered 2016-08-29: 14:00:00 via INTRAVENOUS

## 2016-08-29 MED ORDER — FILGRASTIM 300 MCG/ML IJ SOLN
300.0000 ug | Freq: Once | INTRAMUSCULAR | Status: AC
  Administered 2016-08-29: 300 ug via SUBCUTANEOUS
  Filled 2016-08-29: qty 1

## 2016-08-29 NOTE — Progress Notes (Addendum)
Progress Note    Heather Hinton  CBU:[id number] DOB: Feb 07, 1961  DOA: 08/28/2016 PCP: Neale Burly, MD    Brief Narrative:   Chief complaint: Follow-up shortness of breath  Medical records reviewed and are as summarized below:  Heather Hinton is an 56 y.o. female with a PMH of metastatic lung cancer diagnosed 0/3212 complicated by bone and brain metastasis, status post radiation treatment to her brain, chest and bone. She began chemotherapy on 08/21/16 and was admitted 08/28/16 with shortness of breath. She was found to have neutropenia, tachycardia and tachypnea with no obvious infectious source.  Assessment/Plan:   Principal Problem:   SIRS (systemic inflammatory response syndrome) (HCC) rule out sepsis in a patient with neutropenia status post systemic chemotherapy: Unchanged Patient was placed on empiric vancomycin and Zosyn. Blood cultures 2 and urine culture sent. Urinalysis negative for signs of infection. Chest x-ray negative for infiltrates. CXR negative for infiltrates. Lactic acid normal. Although tachycardic and tachypneic, there was no hypoxia or elevated cardiac enzymes. Continue empiric treatment for possible sepsis. Tachycardia and tachypnea persist. Blood pressure stable. Afebrile.    Sinus tachycardia: Unchanged Pulmonary embolism ruled out. May be from sepsis, anemia. TSH WNL, no evidence of hyperthyroidism. The patient continues to be tachycardic.  Active Problems:   Lower GI bleeding: New D/C Lovenox.  Give 1 unit of PRBCs.    Metastatic lung cancer (metastasis from lung to other site) (HCC)/bone and brain metastasis s/p systemic chemotherapy resulting in pancytopenia: Ongoing Status post systemic chemotherapy with carboplatin and Alimta on 08/21/16.  Dr. Alvy Bimler aware of admission, will get a local oncologist to follow. Started on Neupogen    Hyperglycemia: Improved Not known to be diabetic. May be from recent steroid use prior to and following  chemotherapy. Follow-up hemoglobin A1c. Currently being managed with insulin sensitive SSI. Glucoses have been well controlled with no need for supplemental insulin: CBGs 75-95.    Essential hypertension: Stable Blood pressure currently controlled. Not on antihypertensives at home.    Symptomatic anemia/iron deficiency anemia/anemia related to chemotherapy/B 12 deficiency anemia: Worsening Hemoglobin 8. Given dyspnea, may benefit from a blood transfusion.    Cancer associated pain: Stable Continue home pain medications.    Severe protein-calorie malnutrition (HCC):Stable Continue multivitamin. Body mass index is 22.66 kg/m.    HIV screening Done 07/15/16: Nonreactive  Family Communication/Anticipated D/C date and plan/Code Status   DVT prophylaxis: SCDs ordered. Code Status: Full Code.  Family Communication: No family at the bedside. Tried to call husband, no answer. Message left. Disposition Plan: Remain in SDU, unstable condition with new GI bleeding, pancytopenia, SIRS.   Medical Consultants:    Oncology   Anti-Infectives:    Vancomycin 08/28/16--->  Zosyn 08/28/16--->  Subjective:   Still tachypneic and short of breath.  RN reported that the patient had a cup of bloody stool after I rounded on her today.  No nausea or vomiting.   Objective:    Vitals:   08/29/16 0400 08/29/16 0500 08/29/16 0600 08/29/16 0800  BP: 116/60  120/72   Pulse: (!) 116 (!) 119 (!) 120   Resp: (!) 21 (!) 23 13   Temp: 98.6 F (37 C)   99.8 F (37.7 C)  TempSrc: Oral   Oral  SpO2: 100% 100% 100%   Weight:      Height:        Intake/Output Summary (Last 24 hours) at 08/29/16 1057 Last data filed at 08/29/16 0957  Gross per 24 hour  Intake  1851.67 ml  Output                0 ml  Net          1851.67 ml   Filed Weights   08/28/16 1137 08/28/16 1323 08/28/16 1628  Weight: 59 kg (130 lb) 59.9 kg (132 lb) 57.1 kg (125 lb 14.1 oz)    Exam: General exam:  Chronically ill appearing. Not in any distress. Respiratory system: Lungs diminished but clear with fair air movement.  No wheezes. Cardiovascular system: Heart sounds are regular, tachycardic with II/VI SEM. Gastrointestinal system: Abdomen is soft, non-tender, non-distended with normal active bowel sounds. Central nervous system: Alert and oriented x 3, non-focal. Extremities: No clubbing, edema or cyanosis, pedal pulses 2+. Skin: No rashes.  Ecchymosis around right chest wall Port-A-Cath site. Psychiatry: Mood and affect depressed/flat.  Data Reviewed:   I have personally reviewed following labs and imaging studies:  Labs: Labs show the following: Chemistries not repeated. WBC 0.7, hemoglobin 7.3, platelets 53.  Alkaline phosphatase 149, albumin 2.4, lipase 29, TSH 0.778, lactic acid 1.02.  Cultures: Blood cultures 27/17/18: Negative to date MRSA swab: Negative  Procedures and diagnostic studies:   Dg Chest 2 View 08/28/2016: Lymphadenopathy in the right hilum and mediastinum: No pulmonary emboli.     Ct Angio Chest Pe W And/or Wo Contrast 08/28/2016: Middle lobe mass with evidence of metastasis in the thoracic spine and axillary areas bilaterally, left paraspinal area, and subcutaneous tissues in the posterior chest wall. Lymphadenopathy. No compression of the trachea or mainstem bronchi noted.     Medications:   . docusate sodium  100 mg Oral BID  . insulin aspart  0-5 Units Subcutaneous QHS  . insulin aspart  0-9 Units Subcutaneous TID WC  . morphine  15 mg Oral Q12H  . multivitamin with minerals  1 tablet Oral Daily  . pantoprazole  40 mg Oral Daily  . polyethylene glycol  17 g Oral Daily  . pravastatin  20 mg Oral q1800  . sodium chloride flush  3 mL Intravenous Q12H   Continuous Infusions: . sodium chloride 100 mL/hr at 08/29/16 0957  . sodium chloride    . piperacillin-tazobactam Stopped (08/28/16 1438)  . piperacillin-tazobactam (ZOSYN)  IV 3.375 g  (08/29/16 0558)  . vancomycin Stopped (08/29/16 0440)    Medical decision making is of high complexity and this patient is at high risk of deterioration, therefore this is a level 3 visit.  (> 4 problem points, 1 data points, high risk: Need 2 out of 3)  Problems/DDx Points   Self limited or minor (max 2)       1   Established problem, stable       1  4+  Established problem, worsening       2  2  New problem, no additional W/U planned (max 1)       3  3  New problem, additional W/U planned        4    Data Reviewed Points   Review/order clinical lab tests       1  1  Review/order x-rays       1   Review/order tests (Echo, EKG, PFTs, etc)       1   Discussion of test results w/ performing MD       1   Independent review of image, tracing or specimen       2   Decision to obtain old records  1   Review and summation of old records       2    Level of risk Presenting prob Diagnostics Management   Minimal 1 self limited/minor Labs CXR EKG/EEG U/A U/S Rest Gargles Bandages Dressings   Low 2 or more self limited/minor 1 stable chronic Acute uncomplicated illness Tests (PFTS) Non-CV imaging Arterial labs Biopsies of skin OTC drugs Minor surgery-no risk PT OT IVF without additives    Moderate 1 or more chronic illnesses w/ mild exac, progression or S/E from tx 2 or more stable chronic illnesses Undiagnosed new problem w/ uncertain prognosis Acute complicated injury  Stress tests Endoscopies with no risk factors Deep needle or incisional bx CV imaging without risk LP Thoracentesis Paracentesis Minor surgery w/ risks Elective major surgery w/ no risk (open, percutaneous or endoscopic) Prescription drugs Therapeutic nucl med IVF with additives Closed tx of fracture/dislocation    High Severe exac of chronic illness Acute or chronic illness/injury may pose a threat to life or bodily function (ARF) Change in neuro status    CV imaging w/ contrast and  risk Cardio electophysiologic tests Endoscopies w/ risk Discography Elective major surgery Emergency major surgery Parenteral controlled substances Drug therapy req monitoring for toxicity DNR/de-escalation of care    MDM Prob points Data points Risk   Straightforward    <1    <1    Min   Low complexity    2    2    Low   Moderate    3    3    Mod   High Complexity    4 or more    4 or more    High      LOS: 1 day   Cimberly Stoffel  [facility] Pager [phone]. If unable to reach me by pager, please call my cell phone at [phone].  *Please refer to [email or web address], password [email or web address] to get updated schedule on who will round on this patient, as hospitalists switch teams weekly. If 7PM-7AM, please contact night-coverage at [email or web address], password [email or web address] for any overnight needs.  08/29/2016, 10:57 AM

## 2016-08-29 NOTE — Progress Notes (Signed)
Patient passed clumps of blood clots from rectum, about 1 cup full, reports having problems with hemorrhoids, Dr. Rockne Menghini notified, orders entered.

## 2016-08-29 NOTE — Consult Note (Signed)
Consult for hem/onc received for pancytopenia.  Chart is reviewed.  Patient admitted with SIRS and to rule out sepsis.  She has Stage IV poorly differentiated carcinoma (NSCLC), presumed to be lung primary given her clinic picture with lung mass.  Molecular testing was performed and all were negative including PDL1, ROS, EGFR, ALK, etc.  MSI-stable as well.  She is S/P palliative XRT to right lung and pelvis finishing on 07/30/16 and SRS to brain lesions on 08/03/16 by Dr. Tammi Klippel.  She was started Carboplatin/Alimta on 08/21/16 under the care of Dr. Heath Lark ([facility]c in [place]).  Therefore, she is day 9 of cycle 1.  At time of her treatment, her HGB was 8.9, mild leukocytosis, and low-normal platelet count.  Her labs absolutely satisfied treatment parameters.  She DID NOT receive Neulasta with cycle 1.  This too is appropriate given her chemotherapy regimen and lack of risk factors.  Of note, Neulasta OnPro has been added to her treatment plan moving forward (on my review of the patient's treatment plan).  She received her pre-treatment B12 injection as well.  I am not sure the utility of an Alimta -based treatment given pathology being unable to call an adenocarcinoma on biopsy specimen, but will defer to her treating oncologist.    She is now neutropenic with an [place] of 500.  Neupogen has been started by Hospitalist. Thrombocytopenia, likely secondary to chemotherapy +/- reactive to acute problems +/- dilutional worsening since admission.  Recommend platelet transfusion for platelet count 20,000 or less and/or active bleeding. Anemia, below baseline.  Likely secondary to chemotherapy.  Anemia panel unimpressive prior to chemotherapy start.  Given recent lower GI bleed, recommend repeat iron studies.  Recommend RBC transfusion to maintain HGB ~ 8-9 g/dL (no documented cardiac disease in chart).  Patient not seen.  Recommend telephone consulting her primary medical oncologist and/or transfer to [facility]  where she receives her oncology care for any hematology/oncology related complications/issues.  Robynn Pane, PA-C 08/29/2016 12:40 PM

## 2016-08-29 NOTE — Plan of Care (Signed)
Problem: Skin Integrity: Goal: Risk for impaired skin integrity will decrease Outcome: Progressing Pt prescribed lovenox for dvt prophylactic while inpatient

## 2016-08-30 LAB — CBC WITH DIFFERENTIAL/PLATELET
BASOS ABS: 0 10*3/uL (ref 0.0–0.1)
BASOS PCT: 0 %
EOS ABS: 0 10*3/uL (ref 0.0–0.7)
EOS PCT: 0 %
HCT: 24.5 % — ABNORMAL LOW (ref 36.0–46.0)
Hemoglobin: 7.9 g/dL — ABNORMAL LOW (ref 12.0–15.0)
Lymphocytes Relative: 22 %
Lymphs Abs: 0.2 10*3/uL — ABNORMAL LOW (ref 0.7–4.0)
MCH: 27.2 pg (ref 26.0–34.0)
MCHC: 32.2 g/dL (ref 30.0–36.0)
MCV: 84.5 fL (ref 78.0–100.0)
MONO ABS: 0.1 10*3/uL (ref 0.1–1.0)
Monocytes Relative: 9 %
Neutro Abs: 0.6 10*3/uL — ABNORMAL LOW (ref 1.7–7.7)
Neutrophils Relative %: 70 %
PLATELETS: 35 10*3/uL — AB (ref 150–400)
RBC: 2.9 MIL/uL — AB (ref 3.87–5.11)
RDW: 19.2 % — AB (ref 11.5–15.5)
WBC: 0.8 10*3/uL — AB (ref 4.0–10.5)

## 2016-08-30 LAB — GLUCOSE, CAPILLARY
GLUCOSE-CAPILLARY: 109 mg/dL — AB (ref 65–99)
Glucose-Capillary: 76 mg/dL (ref 65–99)
Glucose-Capillary: 95 mg/dL (ref 65–99)
Glucose-Capillary: 100 mg/dL — ABNORMAL HIGH (ref 65–99)

## 2016-08-30 LAB — BASIC METABOLIC PANEL
Anion gap: 11 (ref 5–15)
BUN: 10 mg/dL (ref 6–20)
CALCIUM: 9 mg/dL (ref 8.9–10.3)
CHLORIDE: 99 mmol/L — AB (ref 101–111)
CO2: 23 mmol/L (ref 22–32)
CREATININE: 0.55 mg/dL (ref 0.44–1.00)
GFR calc non Af Amer: >60 mL/min (ref >60)
Glucose, Bld: 96 mg/dL (ref 65–99)
Potassium: 3.5 mmol/L (ref 3.5–5.1)
SODIUM: 133 mmol/L — AB (ref 135–145)

## 2016-08-30 LAB — PREPARE RBC (CROSSMATCH)

## 2016-08-30 LAB — PATHOLOGIST SMEAR REVIEW

## 2016-08-30 LAB — HEMOGLOBIN A1C
HEMOGLOBIN A1C: 5.3 % (ref 4.8–5.6)
MEAN PLASMA GLUCOSE: 105 mg/dL

## 2016-08-30 MED ORDER — FILGRASTIM 300 MCG/ML IJ SOLN
300.0000 ug | Freq: Every day | INTRAMUSCULAR | Status: DC
  Administered 2016-08-30 – 2016-08-31 (×2): 300 ug via SUBCUTANEOUS
  Filled 2016-08-30 (×4): qty 1

## 2016-08-30 MED ORDER — SODIUM CHLORIDE 0.9 % IV SOLN
Freq: Once | INTRAVENOUS | Status: AC
  Administered 2016-08-30: 13:00:00 via INTRAVENOUS

## 2016-08-30 NOTE — Plan of Care (Signed)
Problem: Education: Goal: Knowledge of [place] [facility] Education information/materials will improve Outcome: Progressing Discuss with patient the importance of infection prevention and why staff wears PPE.

## 2016-08-30 NOTE — Progress Notes (Signed)
Progress Note    Heather Hinton  ZOX:[id number] DOB: June 05, 1960  DOA: 08/28/2016 PCP: Neale Burly, MD    Brief Narrative:   Chief complaint: Follow-up shortness of breath  Medical records reviewed and are as summarized below:  Heather Hinton is an 56 y.o. female with a PMH of metastatic lung cancer diagnosed 09/1189 complicated by bone and brain metastasis, status post radiation treatment to her brain, chest and bone. She began chemotherapy on 08/21/16 and was admitted 08/28/16 with shortness of breath. She was found to have neutropenia, tachycardia and tachypnea with no obvious infectious source.  Assessment/Plan:   Principal Problem:   SIRS (systemic inflammatory response syndrome) (HCC) rule out sepsis in a patient with neutropenia status post systemic chemotherapy: Unchanged Patient was placed on empiric vancomycin and Zosyn. Blood cultures 2 and urine culture sent. Urinalysis negative for signs of infection. Chest x-ray negative for infiltrates. CXR negative for infiltrates. Lactic acid normal. Although tachycardic and tachypneic, there was no hypoxia or elevated cardiac enzymes. Continue empiric treatment for possible sepsis. Tachycardia and tachypnea persist. Blood pressure stable. Afebrile.Culture data remains negative.    Sinus tachycardia: Unchanged Pulmonary embolism ruled out. May be from sepsis, anemia. TSH WNL, no evidence of hyperthyroidism. The patient continues to be tachycardic. Heart rate up to 145 bpm noted. Hopefully getting another unit of blood today will help her heart rate  Active Problems:   Lower GI bleeding: New Given 1 unit of PRBCs yesterday, will give another unit today. No further GI bleeding reported.    Metastatic lung cancer (metastasis from lung to other site) (HCC)/bone and brain metastasis s/p systemic chemotherapy resulting in pancytopenia: Ongoing Status post systemic chemotherapy with carboplatin and Alimta on 08/21/16.  Dr. Alvy Bimler aware  of admission, will get a local oncologist to follow. Started on Neupogen, repeat today as there has not been any significant change in her WBC. Platelet count is dropping. May need platelets if her counts drop below 20 or she develops signs of ongoing bleeding.    Hyperglycemia: Improved Not known to be diabetic. May be from recent steroid use prior to and following chemotherapy. Follow-up hemoglobin A1c. Currently being managed with insulin sensitive SSI. Glucoses have been well controlled with no need for supplemental insulin: CBGs 75-115.    Essential hypertension: Stable Blood pressure currently controlled. Not on antihypertensives at home.    Symptomatic anemia/iron deficiency anemia/anemia related to chemotherapy/B 12 deficiency anemia: Worsening Hemoglobin 7.9 this morning. She received 1 unit of PRBCs yesterday after she developed rectal bleeding. Another unit scheduled for today.    Cancer associated pain: Stable Continue home pain medications.    Severe protein-calorie malnutrition (HCC):Stable Continue multivitamin. Body mass index is 22.66 kg/m.    HIV screening Done 07/15/16: Nonreactive  Family Communication/Anticipated D/C date and plan/Code Status   DVT prophylaxis: SCDs ordered. Code Status: Full Code.  Family Communication: No family at the bedside. Left message with husband's answering machine yesterday, and I have advised the patient on how her husband can reach me if he would like an update. Disposition Plan: Remain in SDU, unstable condition with new GI bleeding, pancytopenia, SIRS.   Medical Consultants:    Oncology   Anti-Infectives:    Vancomycin 08/28/16--->  Zosyn 08/28/16--->  Subjective:   The patient says she feels better. She has not had any further blood in the stools or any further bowel movements since her bloody stool yesterday. Denies nausea/vomiting. Still mildly short of breath. No cough. No chest  pain.  Objective:    Vitals:     08/30/16 0600 08/30/16 0700 08/30/16 0741 08/30/16 0800  BP: 98/66 111/67  118/63  Pulse: (!) 118 (!) 120  (!) 145  Resp: 20 16  (!) 23  Temp:   98.8 F (37.1 C)   TempSrc:   Oral   SpO2: 100% 100%  (!) 72%  Weight:      Height:        Intake/Output Summary (Last 24 hours) at 08/30/16 0904 Last data filed at 08/30/16 0800  Gross per 24 hour  Intake          3885.17 ml  Output              925 ml  Net          2960.17 ml   Filed Weights   08/28/16 1323 08/28/16 1628 08/30/16 0500  Weight: 59.9 kg (132 lb) 57.1 kg (125 lb 14.1 oz) 57.1 kg (125 lb 14.1 oz)    Exam: Unchanged from 08/29/16 except as noted General exam: Chronically ill appearing. Not in any distress. Respiratory system: Lungs diminished but clear with fair air movement.  No wheezes. Cardiovascular system: Heart sounds are regular, tachycardic with II/VI SEM. Gastrointestinal system: Abdomen is soft, non-tender, non-distended with normal active bowel sounds. Central nervous system: Alert and oriented x 3, non-focal. Extremities: No clubbing, edema or cyanosis, pedal pulses 2+. Skin: No rashes.  Ecchymosis around right chest wall Port-A-Cath site. Psychiatry: Mood and affect a little brighter today.  Data Reviewed:   I have personally reviewed following labs and imaging studies:  Labs: Labs show the following: Sodium 133, potassium 3.5, chloride 99, bicarbonate 23, BUN 10, creatinine 0.55, glucose 96. WBC 0.8, hemoglobin 7.9, hematocrit 24.5, platelets 35.  Alkaline phosphatase 149, albumin 2.4, lipase 29, TSH 0.778, lactic acid 1.02.  Cultures: Blood cultures 27/17/18: Negative to date MRSA swab: Negative  Procedures and diagnostic studies:   Dg Chest 2 View 08/28/2016: Lymphadenopathy in the right hilum and mediastinum: No pulmonary emboli.     Ct Angio Chest Pe W And/or Wo Contrast 08/28/2016: Middle lobe mass with evidence of metastasis in the thoracic spine and axillary areas bilaterally, left  paraspinal area, and subcutaneous tissues in the posterior chest wall. Lymphadenopathy. No compression of the trachea or mainstem bronchi noted.     Medications:   . docusate sodium  100 mg Oral BID  . insulin aspart  0-5 Units Subcutaneous QHS  . insulin aspart  0-9 Units Subcutaneous TID WC  . morphine  15 mg Oral Q12H  . multivitamin with minerals  1 tablet Oral Daily  . pantoprazole  40 mg Oral Daily  . polyethylene glycol  17 g Oral Daily  . pravastatin  20 mg Oral q1800  . sodium chloride flush  3 mL Intravenous Q12H   Continuous Infusions: . sodium chloride Stopped (08/30/16 0600)  . piperacillin-tazobactam Stopped (08/28/16 1438)  . piperacillin-tazobactam (ZOSYN)  IV 3.375 g (08/30/16 0601)  . vancomycin Stopped (08/30/16 0300)    Medical decision making is of high complexity and this patient is at high risk of deterioration, therefore this is a level 3 visit.  (> 4 problem points, 1 data points, high risk: Need 2 out of 3)  Problems/DDx Points   Self limited or minor (max 2)       1   Established problem, stable       1  4+  Established problem, worsening  2  2  New problem, no additional W/U planned (max 1)       3  3  New problem, additional W/U planned        4    Data Reviewed Points   Review/order clinical lab tests       1  1  Review/order x-rays       1   Review/order tests (Echo, EKG, PFTs, etc)       1   Discussion of test results w/ performing MD       1   Independent review of image, tracing or specimen       2   Decision to obtain old records       1   Review and summation of old records       2    Level of risk Presenting prob Diagnostics Management   Minimal 1 self limited/minor Labs CXR EKG/EEG U/A U/S Rest Gargles Bandages Dressings   Low 2 or more self limited/minor 1 stable chronic Acute uncomplicated illness Tests (PFTS) Non-CV imaging Arterial labs Biopsies of skin OTC drugs Minor surgery-no risk PT OT IVF without  additives    Moderate 1 or more chronic illnesses w/ mild exac, progression or S/E from tx 2 or more stable chronic illnesses Undiagnosed new problem w/ uncertain prognosis Acute complicated injury  Stress tests Endoscopies with no risk factors Deep needle or incisional bx CV imaging without risk LP Thoracentesis Paracentesis Minor surgery w/ risks Elective major surgery w/ no risk (open, percutaneous or endoscopic) Prescription drugs Therapeutic nucl med IVF with additives Closed tx of fracture/dislocation    High Severe exac of chronic illness Acute or chronic illness/injury may pose a threat to life or bodily function (ARF) Change in neuro status    CV imaging w/ contrast and risk Cardio electophysiologic tests Endoscopies w/ risk Discography Elective major surgery Emergency major surgery Parenteral controlled substances Drug therapy req monitoring for toxicity DNR/de-escalation of care    MDM Prob points Data points Risk   Straightforward    <1    <1    Min   Low complexity    2    2    Low   Moderate    3    3    Mod   High Complexity    4 or more    4 or more    High      LOS: 2 days   Daina Cara  [facility] Pager [phone]. If unable to reach me by pager, please call my cell phone at [phone].  *Please refer to [email or web address], password [email or web address] to get updated schedule on who will round on this patient, as hospitalists switch teams weekly. If 7PM-7AM, please contact night-coverage at [email or web address], password [email or web address] for any overnight needs.  08/30/2016, 9:04 AM

## 2016-08-30 NOTE — Progress Notes (Signed)
Pt's Husband requested to speak with MD. MD Rama speaking with pt's husband now.  Margaret Pyle, RN

## 2016-08-30 NOTE — Plan of Care (Signed)
Problem: Pain Managment: Goal: General experience of comfort will improve Outcome: Progressing Assessed pain and help with therapeutic repositioning and PRN medications.

## 2016-08-31 LAB — TYPE AND SCREEN
ABO/RH(D): A POS
Antibody Screen: NEGATIVE
UNIT DIVISION: 0
UNIT DIVISION: 0
Unit division: 0

## 2016-08-31 LAB — BPAM RBC
BLOOD PRODUCT EXPIRATION DATE: 201808082359
Blood Product Expiration Date: 201808022359
Blood Product Expiration Date: 201808022359
ISSUE DATE / TIME: 201807190006
ISSUE DATE / TIME: 201807191217
ISSUE DATE / TIME: 201807181344
UNIT TYPE AND RH: 600
UNIT TYPE AND RH: 6200
UNIT TYPE AND RH: 6200

## 2016-08-31 LAB — CBC WITH DIFFERENTIAL/PLATELET
BASOS PCT: 0 %
Basophils Absolute: 0 10*3/uL (ref 0.0–0.1)
EOS PCT: 0 %
Eosinophils Absolute: 0 10*3/uL (ref 0.0–0.7)
HEMATOCRIT: 26.4 % — AB (ref 36.0–46.0)
Hemoglobin: 8.9 g/dL — ABNORMAL LOW (ref 12.0–15.0)
LYMPHS PCT: 26 %
Lymphs Abs: 0.3 10*3/uL — ABNORMAL LOW (ref 0.7–4.0)
MCH: 28.3 pg (ref 26.0–34.0)
MCHC: 33.7 g/dL (ref 30.0–36.0)
MCV: 83.8 fL (ref 78.0–100.0)
MONOS PCT: 15 %
Monocytes Absolute: 0.2 10*3/uL (ref 0.1–1.0)
NEUTROS PCT: 59 %
Neutro Abs: 0.6 10*3/uL — ABNORMAL LOW (ref 1.7–7.7)
Platelets: 18 10*3/uL — CL (ref 150–400)
RBC: 3.15 MIL/uL — ABNORMAL LOW (ref 3.87–5.11)
RDW: 18.9 % — ABNORMAL HIGH (ref 11.5–15.5)
WBC: 1.1 10*3/uL — CL (ref 4.0–10.5)

## 2016-08-31 LAB — GLUCOSE, CAPILLARY
GLUCOSE-CAPILLARY: 105 mg/dL — AB (ref 65–99)
GLUCOSE-CAPILLARY: 79 mg/dL (ref 65–99)
Glucose-Capillary: 107 mg/dL — ABNORMAL HIGH (ref 65–99)
Glucose-Capillary: 99 mg/dL (ref 65–99)

## 2016-08-31 MED ORDER — HYDROCORTISONE 2.5 % RE CREA
TOPICAL_CREAM | RECTAL | Status: DC | PRN
  Administered 2016-08-31: 10:00:00 via RECTAL
  Filled 2016-08-31: qty 28.35

## 2016-08-31 MED ORDER — WITCH HAZEL-GLYCERIN EX PADS: MEDICATED_PAD | CUTANEOUS | Status: DC | PRN

## 2016-08-31 NOTE — Progress Notes (Signed)
Progress Note    Heather Hinton  JOI:[id number] DOB: [birth year]  DOA: 08/28/2016 PCP: Neale Burly, MD    Brief Narrative:   Chief complaint: Follow-up shortness of breath  Medical records reviewed and are as summarized below:  Heather Hinton is an 56 y.o. female with a PMH of metastatic lung cancer diagnosed 05/7094 complicated by bone and brain metastasis, status post radiation treatment to her brain, chest and bone. She began chemotherapy on 08/21/16 and was admitted 08/28/16 with shortness of breath. She was found to have neutropenia, tachycardia and tachypnea with no obvious infectious source.  Assessment/Plan:   Principal Problem:   SIRS (systemic inflammatory response syndrome) (HCC) rule out sepsis in a patient with neutropenia status post systemic chemotherapy: Unchanged Patient was placed on empiric vancomycin and Zosyn. Blood cultures 2 and urine culture sent. Urinalysis negative for signs of infection. Chest x-ray negative for infiltrates. CXR negative for infiltrates. Lactic acid normal. Although tachycardic and tachypneic, there was no hypoxia or elevated cardiac enzymes. Continue empiric treatment for possible sepsis. Tachycardia and tachypnea persist. Now running low-grade fever to 100.8. Blood pressure stable. Culture data remains negative. D/C Vancomycin.    Sinus tachycardia: Unchanged Pulmonary embolism ruled out. May be from sepsis, anemia. TSH WNL, no evidence of hyperthyroidism. The patient continues to be tachycardic. Heart rate remains elevated in the 110s.  Active Problems:   Lower GI bleeding: New Status post 2 units of PRBCs since admission.  Hemoglobin stable at 8.9 this morning.    Metastatic lung cancer (metastasis from lung to other site) (HCC)/bone and brain metastasis s/p systemic chemotherapy resulting in pancytopenia: Unstable Status post systemic chemotherapy with carboplatin and Alimta on 08/21/16.  Dr. Alvy Bimler aware of admission, will get a  local oncologist to follow. Continue Neupogen until [place] greater than 1000, 600 today. Platelet count continues to drop, 18 today. May need platelets if she has any bleeding.    Hyperglycemia: Stable Not known to be diabetic. May be from recent steroid use prior to and following chemotherapy. Follow-up hemoglobin A1c. Currently being managed with insulin sensitive SSI. Glucoses have been well controlled with no need for supplemental insulin: CBGs 76-109.    Essential hypertension: Stable Blood pressure currently controlled. Not on antihypertensives at home.    Symptomatic anemia/iron deficiency anemia/anemia related to chemotherapy/B 12 deficiency anemia: Stable Hemoglobin stable, status post 2 units of PRBCs.    Cancer associated pain: Stable Continue home pain medications.    Severe protein-calorie malnutrition (HCC):Stable Continue multivitamin. Body mass index is 22.66 kg/m.    HIV screening Done 07/15/16: Nonreactive  Family Communication/Anticipated D/C date and plan/Code Status   DVT prophylaxis: SCDs ordered. Code Status: Full Code.  Family Communication: Updated the husband by telephone 08/30/16. Disposition Plan: Likely will remain in the hospital for several more days pending recovery of counts and discontinuation of antibiotics.   Medical Consultants:    Oncology   Anti-Infectives:    Vancomycin 08/28/16--->  Zosyn 08/28/16--->  Subjective:   The patient reports that she was "cold" last night, and feels a bit dizzy this morning.  ROS negative for nausea, vomiting, dyspnea, further bloody stools.  Objective:    Vitals:   08/31/16 0500 08/31/16 0700 08/31/16 0755 08/31/16 0800  BP:  (!) 124/94  121/61  Pulse:  (!) 118  (!) 117  Resp:  (!) 21  16  Temp:   (!) 100.8 F (38.2 C)   TempSrc:      SpO2:  100%  100%  Weight: 57.1 kg (125 lb 14.1 oz)     Height:        Intake/Output Summary (Last 24 hours) at 08/31/16 0927 Last data filed at 08/31/16  2202  Gross per 24 hour  Intake          3057.84 ml  Output              250 ml  Net          2807.84 ml   Filed Weights   08/28/16 1628 08/30/16 0500 08/31/16 0500  Weight: 57.1 kg (125 lb 14.1 oz) 57.1 kg (125 lb 14.1 oz) 57.1 kg (125 lb 14.1 oz)    Exam: Unchanged from 08/30/16 except as noted General exam: Chronically ill appearing. Not in any distress. Respiratory system: Lungs diminished but clear with fair air movement.  No wheezes. Cardiovascular system: Heart sounds are regular, tachycardic with II/VI SEM. Apical rate 119 bpm. Gastrointestinal system: Abdomen is soft, non-tender, non-distended with normal active bowel sounds. Central nervous system: Alert and oriented x 3, no focal neurological deficits, MAEx4 with generalized weakness. Extremities: No clubbing, edema or cyanosis, pedal pulses 2+. Skin: No rashes.  Ecchymosis around right chest wall Port-A-Cath site. Psychiatry: Mood and affect remains bright today.  Data Reviewed:   I have personally reviewed following labs and imaging studies:  Labs: Labs show the following: WBC 1.1, Hemoglobin 8.9, hematocrit 26.4, platelets 18, absolute neutrophil count 0.6.  Cultures: Blood cultures 27/17/18: Negative to date MRSA swab: Negative  Procedures and diagnostic studies:   Dg Chest 2 View 08/28/2016: Lymphadenopathy in the right hilum and mediastinum: No pulmonary emboli.     Ct Angio Chest Pe W And/or Wo Contrast 08/28/2016: Middle lobe mass with evidence of metastasis in the thoracic spine and axillary areas bilaterally, left paraspinal area, and subcutaneous tissues in the posterior chest wall. Lymphadenopathy. No compression of the trachea or mainstem bronchi noted.     Medications:   . docusate sodium  100 mg Oral BID  . filgrastim (NEUPOGEN)  SQ  300 mcg Subcutaneous q1800  . insulin aspart  0-5 Units Subcutaneous QHS  . insulin aspart  0-9 Units Subcutaneous TID WC  . morphine  15 mg Oral Q12H  .  multivitamin with minerals  1 tablet Oral Daily  . pantoprazole  40 mg Oral Daily  . polyethylene glycol  17 g Oral Daily  . pravastatin  20 mg Oral q1800  . sodium chloride flush  3 mL Intravenous Q12H   Continuous Infusions: . sodium chloride 100 mL/hr at 08/31/16 0228  . piperacillin-tazobactam Stopped (08/28/16 1438)  . piperacillin-tazobactam (ZOSYN)  IV 3.375 g (08/31/16 0639)  . vancomycin Stopped (08/31/16 0328)    Medical decision making is of high complexity and this patient is at high risk of deterioration, therefore this is a level 3 visit.  (> 4 problem points, 1 data points, high risk: Need 2 out of 3)  Problems/DDx Points   Self limited or minor (max 2)       1   Established problem, stable       1  4+  Established problem, worsening       2  2  New problem, no additional W/U planned (max 1)       3  3  New problem, additional W/U planned        4    Data Reviewed Points   Review/order clinical lab tests  1  1  Review/order x-rays       1   Review/order tests (Echo, EKG, PFTs, etc)       1   Discussion of test results w/ performing MD       1   Independent review of image, tracing or specimen       2   Decision to obtain old records       1   Review and summation of old records       2    Level of risk Presenting prob Diagnostics Management   Minimal 1 self limited/minor Labs CXR EKG/EEG U/A U/S Rest Gargles Bandages Dressings   Low 2 or more self limited/minor 1 stable chronic Acute uncomplicated illness Tests (PFTS) Non-CV imaging Arterial labs Biopsies of skin OTC drugs Minor surgery-no risk PT OT IVF without additives    Moderate 1 or more chronic illnesses w/ mild exac, progression or S/E from tx 2 or more stable chronic illnesses Undiagnosed new problem w/ uncertain prognosis Acute complicated injury  Stress tests Endoscopies with no risk factors Deep needle or incisional bx CV imaging without risk LP Thoracentesis Paracentesis  Minor surgery w/ risks Elective major surgery w/ no risk (open, percutaneous or endoscopic) Prescription drugs Therapeutic nucl med IVF with additives Closed tx of fracture/dislocation    High Severe exac of chronic illness Acute or chronic illness/injury may pose a threat to life or bodily function (ARF) Change in neuro status    CV imaging w/ contrast and risk Cardio electophysiologic tests Endoscopies w/ risk Discography Elective major surgery Emergency major surgery Parenteral controlled substances Drug therapy req monitoring for toxicity DNR/de-escalation of care    MDM Prob points Data points Risk   Straightforward    <1    <1    Min   Low complexity    2    2    Low   Moderate    3    3    Mod   High Complexity    4 or more    4 or more    High      LOS: 3 days   RAMA,CHRISTINA  [facility] Pager [phone]. If unable to reach me by pager, please call my cell phone at [phone].  *Please refer to [email or web address], password [email or web address] to get updated schedule on who will round on this patient, as hospitalists switch teams weekly. If 7PM-7AM, please contact night-coverage at [email or web address], password [email or web address] for any overnight needs.  08/31/2016, 9:27 AM

## 2016-08-31 NOTE — Plan of Care (Signed)
Problem: Pain Managment: Goal: General experience of comfort will improve Outcome: Progressing Pt able to describe pain using 0-10 pain scale. Pt does receive scheduled long acting pain med.  Problem: Tissue Perfusion: Goal: Risk factors for ineffective tissue perfusion will decrease Outcome: Progressing Pt wearing SCDs for DVT prevention

## 2016-08-31 NOTE — Progress Notes (Signed)
CRITICAL VALUE ALERT  Critical Value:  WBC 1.1       Plt Count 18  Date & Time Notied:  08/31/16   0719  Provider Notified: MD Rama

## 2016-09-01 ENCOUNTER — Inpatient Hospital Stay (HOSPITAL_COMMUNITY): Payer: Medicaid Other

## 2016-09-01 DIAGNOSIS — D509 Iron deficiency anemia, unspecified: Secondary | ICD-10-CM

## 2016-09-01 DIAGNOSIS — L8990 Pressure ulcer of unspecified site, unspecified stage: Secondary | ICD-10-CM | POA: Insufficient documentation

## 2016-09-01 LAB — GLUCOSE, CAPILLARY
Glucose-Capillary: 84 mg/dL (ref 65–99)
Glucose-Capillary: 79 mg/dL (ref 65–99)
Glucose-Capillary: 75 mg/dL (ref 65–99)

## 2016-09-01 LAB — BASIC METABOLIC PANEL
Anion gap: 8 (ref 5–15)
BUN: 7 mg/dL (ref 6–20)
CHLORIDE: 103 mmol/L (ref 101–111)
CO2: 22 mmol/L (ref 22–32)
CREATININE: 0.51 mg/dL (ref 0.44–1.00)
Calcium: 8.3 mg/dL — ABNORMAL LOW (ref 8.9–10.3)
GFR calc Af Amer: >60 mL/min (ref >60)
GFR calc non Af Amer: >60 mL/min (ref >60)
Glucose, Bld: 99 mg/dL (ref 65–99)
Potassium: 3.2 mmol/L — ABNORMAL LOW (ref 3.5–5.1)
Sodium: 133 mmol/L — ABNORMAL LOW (ref 135–145)

## 2016-09-01 LAB — CBC WITH DIFFERENTIAL/PLATELET
BASOS PCT: 0 %
Basophils Absolute: 0 10*3/uL (ref 0.0–0.1)
EOS PCT: 0 %
Eosinophils Absolute: 0 10*3/uL (ref 0.0–0.7)
HEMATOCRIT: 24.1 % — AB (ref 36.0–46.0)
Hemoglobin: 7.9 g/dL — ABNORMAL LOW (ref 12.0–15.0)
LYMPHS ABS: 0.4 10*3/uL — AB (ref 0.7–4.0)
Lymphocytes Relative: 14 %
MCH: 27.5 pg (ref 26.0–34.0)
MCHC: 32.8 g/dL (ref 30.0–36.0)
MCV: 84 fL (ref 78.0–100.0)
MONO ABS: 0.2 10*3/uL (ref 0.1–1.0)
Monocytes Relative: 8 %
Neutro Abs: 2.1 10*3/uL (ref 1.7–7.7)
Neutrophils Relative %: 78 %
Platelets: 14 10*3/uL — CL (ref 150–400)
RBC: 2.87 MIL/uL — ABNORMAL LOW (ref 3.87–5.11)
RDW: 18.9 % — AB (ref 11.5–15.5)
WBC: 2.7 10*3/uL — ABNORMAL LOW (ref 4.0–10.5)

## 2016-09-01 MED ORDER — IPRATROPIUM BROMIDE 0.02 % IN SOLN
0.5000 mg | Freq: Four times a day (QID) | RESPIRATORY_TRACT | Status: DC
  Administered 2016-09-01: 0.5 mg via RESPIRATORY_TRACT
  Filled 2016-09-01: qty 2.5

## 2016-09-01 MED ORDER — POTASSIUM CHLORIDE IN NACL 20-0.9 MEQ/L-% IV SOLN
INTRAVENOUS | Status: DC
  Administered 2016-09-01: 1 mL via INTRAVENOUS
  Administered 2016-09-01: 100 mL via INTRAVENOUS
  Administered 2016-09-02: 1 mL via INTRAVENOUS
  Administered 2016-09-03 – 2016-09-04 (×2): via INTRAVENOUS

## 2016-09-01 MED ORDER — IPRATROPIUM BROMIDE 0.02 % IN SOLN
RESPIRATORY_TRACT | Status: AC
  Filled 2016-09-01: qty 2.5

## 2016-09-01 MED ORDER — LEVALBUTEROL HCL 0.63 MG/3ML IN NEBU
INHALATION_SOLUTION | RESPIRATORY_TRACT | Status: AC
  Filled 2016-09-01: qty 3

## 2016-09-01 MED ORDER — LEVALBUTEROL HCL 0.63 MG/3ML IN NEBU
0.6300 mg | INHALATION_SOLUTION | Freq: Four times a day (QID) | RESPIRATORY_TRACT | Status: DC
  Administered 2016-09-01: 0.63 mg via RESPIRATORY_TRACT
  Filled 2016-09-01: qty 3

## 2016-09-01 MED ORDER — SODIUM CHLORIDE 0.9 % IV BOLUS (SEPSIS)
500.0000 mL | Freq: Once | INTRAVENOUS | Status: AC
  Administered 2016-09-01: 500 mL via INTRAVENOUS

## 2016-09-01 MED ORDER — FILGRASTIM 300 MCG/ML IJ SOLN
300.0000 ug | Freq: Every day | INTRAMUSCULAR | Status: AC
  Administered 2016-09-01: 300 ug via SUBCUTANEOUS
  Filled 2016-09-01: qty 1

## 2016-09-01 MED ORDER — POTASSIUM CHLORIDE CRYS ER 20 MEQ PO TBCR
40.0000 meq | EXTENDED_RELEASE_TABLET | Freq: Once | ORAL | Status: AC
  Administered 2016-09-01: 40 meq via ORAL
  Filled 2016-09-01: qty 2

## 2016-09-01 MED ORDER — METOPROLOL TARTRATE 25 MG PO TABS
12.5000 mg | ORAL_TABLET | Freq: Two times a day (BID) | ORAL | Status: DC
  Administered 2016-09-01 (×2): 12.5 mg via ORAL
  Filled 2016-09-01 (×2): qty 1

## 2016-09-01 MED ORDER — SODIUM CHLORIDE 0.9 % IV SOLN: Freq: Once | INTRAVENOUS | Status: DC

## 2016-09-01 NOTE — Progress Notes (Signed)
[facility] PROGRESS NOTE  Heather Hinton ZOX:[id number] DOB: 01/03/1961 DOA: 08/28/2016 PCP: Neale Burly, MD  Brief summary   56 y.o. female with a PMH of metastatic lung cancer diagnosed 09/1189 complicated by bone and brain metastasis, status post radiation treatment to her brain, chest and bone. She began chemotherapy on 08/21/16 and was admitted 08/28/16 with shortness of breath. She was found to have neutropenia, tachycardia and tachypnea with no obvious infectious source.  Assessment/Plan:  SIRS (systemic inflammatory response syndrome) (HCC)rule out sepsis in a patient with neutropenia status post systemic chemotherapy: Unchanged Patient was placed on empiric vancomycin and Zosyn. Blood cultures 2 and urine culture sent. Urinalysis negative for signs of infection. Chest x-ray negative for infiltrates. CXR negative for infiltrates.Lactic acid normal. Although tachycardic and tachypneic, there was no hypoxia or elevated cardiac enzymes.  -Continue empiric treatment for possible sepsis. Tachycardia and tachypnea persist. Still febrile, but neutropenia is improved . Blood pressure stable. Culture data remains negative. D/C Vancomycin.  Sinus tachycardia: Unchanged Pulmonary embolism ruled out. May be from sepsis, anemia. TSH WNL, no evidence of hyperthyroidism. The patient continues to be tachycardic. Heart rate remains elevated in the 110s.  Lower GI bleeding: New Status post 2 units of PRBCs since admission.  Hemoglobin stable at 7.9 this morning. Will TF if needed. Check AM  Metastatic lung cancer (metastasis from lung to other site) (HCC)/bone and brain metastasis s/p systemic chemotherapy resulting in pancytopenia: Unstable Status post systemic chemotherapy withcarboplatin and Alimta on 08/21/16. Dr. Alvy Bimler aware of admission, will get a local oncologist to follow. Received Neupogen until [place] greater than 1000. Platelet count continues to drop, 14 today. Will  TF  platelets today .  Hyperglycemia: Stable Not known to be diabetic. May be from recent steroid use prior to and following chemotherapy. Follow-up hemoglobin A1c-5.3. Currently being managed with insulin sensitive SSI. Glucoses have been well controlled with no need for supplemental insulin:  Essential hypertension: Stable Blood pressure currently controlled. Not on antihypertensives at home.  Symptomatic anemia/iron deficiency anemia/anemia related to chemotherapy/B 12 deficiency anemia: Stable Hemoglobin stable, status post 2 units of PRBCs.  Cancer associated pain: Stable Continue home pain medications.  Severe protein-calorie malnutrition (HCC):Stable Continue multivitamin. Body mass index is 22.66 kg/m.  HIV screening Done 07/15/16: Nonreactive  Code Status: full Family Communication: d/w patient, Therapist, sports. No family at the bedside (indicate person spoken with, relationship, and if by phone, the number) Disposition Plan: awaiting clinical improvement    Consultants:  Oncology   Procedures:  none  Antibiotics:  Vancomycin 08/28/16--->  Zosyn 08/28/16--->   HPI/Subjective: Febrile last night. No acute chest pains, no SOB.   Objective: Vitals:   09/01/16 0611 09/01/16 0717  BP:    Pulse:  (!) 128  Resp:  19  Temp: 100 F (37.8 C) 99.8 F (37.7 C)    Intake/Output Summary (Last 24 hours) at 09/01/16 0800 Last data filed at 09/01/16 0723  Gross per 24 hour  Intake             2745 ml  Output              400 ml  Net             2345 ml   Filed Weights   08/30/16 0500 08/31/16 0500 09/01/16 0400  Weight: 57.1 kg (125 lb 14.1 oz) 57.1 kg (125 lb 14.1 oz) 61.1 kg (134 lb 11.2 oz)    Exam:   General:  No distress  Cardiovascular: s1,s2 mild tachycardia   Respiratory: diminished in RLL  Abdomen: soft, nt, nd   Musculoskeletal: no leg edema    Data Reviewed: Basic Metabolic Panel:  Recent Labs Lab 08/28/16 1159 08/30/16 0626  NA 133* 133*   K 3.7 3.5  CL 95* 99*  CO2 24 23  GLUCOSE 184* 96  BUN 20 10  CREATININE 0.66 0.55  CALCIUM 10.0 9.0   Liver Function Tests:  Recent Labs Lab 08/28/16 1159  AST 15  ALT 24  ALKPHOS 149*  BILITOT 1.0  PROT 6.7  ALBUMIN 2.4*    Recent Labs Lab 08/28/16 1159  LIPASE 29   No results for input(s): AMMONIA in the last 168 hours. CBC:  Recent Labs Lab 08/28/16 1159 08/29/16 1104 08/30/16 0626 08/31/16 0524 09/01/16 0450  WBC 0.9* 0.7* 0.8* 1.1* 2.7*  NEUTROABS 0.7* 0.5* 0.6* 0.6* 2.1  HGB 8.0* 7.3* 7.9* 8.9* 7.9*  HCT 25.0* 23.3* 24.5* 26.4* 24.1*  MCV 83.3 83.8 84.5 83.8 84.0  PLT 79* 53* 35* 18* 14*   Cardiac Enzymes: No results for input(s): CKTOTAL, CKMB, CKMBINDEX, TROPONINI in the last 168 hours. BNP (last 3 results)  Recent Labs  08/28/16 1600  BNP 61.0    ProBNP (last 3 results) No results for input(s): PROBNP in the last 8760 hours.  CBG:  Recent Labs Lab 08/31/16 0724 08/31/16 1150 08/31/16 1717 08/31/16 2124 09/01/16 0716  GLUCAP 107* 105* 79 99 84    Recent Results (from the past 240 hour(s))  Blood Culture (routine x 2)     Status: None (Preliminary result)   Collection Time: 08/28/16 12:47 PM  Result Value Ref Range Status   Specimen Description LEFT ANTECUBITAL  Final   Special Requests   Final    BOTTLES DRAWN AEROBIC AND ANAEROBIC Blood Culture results may not be optimal due to an inadequate volume of blood received in culture bottles   Culture NO GROWTH 4 DAYS  Final   Report Status PENDING  Incomplete  Blood Culture (routine x 2)     Status: None (Preliminary result)   Collection Time: 08/28/16  1:24 PM  Result Value Ref Range Status   Specimen Description BLOOD RIGHT HAND  Final   Special Requests   Final    BOTTLES DRAWN AEROBIC AND ANAEROBIC Blood Culture results may not be optimal due to an inadequate volume of blood received in culture bottles   Culture NO GROWTH 4 DAYS  Final   Report Status PENDING  Incomplete   MRSA PCR Screening     Status: None   Collection Time: 08/28/16  4:25 PM  Result Value Ref Range Status   MRSA by PCR NEGATIVE NEGATIVE Final    Comment:        The GeneXpert MRSA Assay (FDA approved for NASAL specimens only), is one component of a comprehensive MRSA colonization surveillance program. It is not intended to diagnose MRSA infection nor to guide or monitor treatment for MRSA infections.      Studies: No results found.  Scheduled Meds: . docusate sodium  100 mg Oral BID  . filgrastim (NEUPOGEN)  SQ  300 mcg Subcutaneous q1800  . insulin aspart  0-5 Units Subcutaneous QHS  . insulin aspart  0-9 Units Subcutaneous TID WC  . morphine  15 mg Oral Q12H  . multivitamin with minerals  1 tablet Oral Daily  . pantoprazole  40 mg Oral Daily  . polyethylene glycol  17 g Oral Daily  . pravastatin  20  mg Oral q1800  . sodium chloride flush  3 mL Intravenous Q12H   Continuous Infusions: . sodium chloride 100 mL/hr at 08/31/16 2130  . piperacillin-tazobactam Stopped (08/28/16 1438)  . piperacillin-tazobactam (ZOSYN)  IV 3.375 g (09/01/16 0612)    Principal Problem:   SIRS (systemic inflammatory response syndrome) (HCC) Active Problems:   Metastatic lung cancer (metastasis from lung to other site) [facility])   Essential hypertension   Antineoplastic chemotherapy induced pancytopenia ([place])   Cancer associated pain   Metastasis to bone (HCC)   Iron deficiency anemia   Metastasis to brain (HCC)   B12 deficiency anemia   Severe protein-calorie malnutrition ([place])   Sepsis ([place])   Lower GI bleed   Pressure injury of skin    Time spent: >35 minutes     Kinnie Feil  [facility] Pager [phone]. If 7PM-7AM, please contact night-coverage at [email or web address], password [facility] 09/01/2016, 8:00 AM  LOS: 4 days

## 2016-09-01 NOTE — Progress Notes (Signed)
Pharmacy Antibiotic Note  Heather Hinton is a 56 y.o. female admitted on 08/28/2016 with sepsis.  Pharmacy has been consulted for zosyn dosing.  Plan: Cont zosyn 3.375 gm IV q8 hours  F/U cxs and clinical progress   Height: 5\' 4"  (162.6 cm) Weight: 134 lb 11.2 oz (61.1 kg) IBW/kg (Calculated) : 54.7  Temp (24hrs), Avg:99.7 F (37.6 C), Min:98.3 F (36.8 C), Max:100.1 F (37.8 C)   Recent Labs Lab 08/28/16 1159 08/28/16 1322 08/29/16 1104 08/30/16 0626 08/31/16 0524 09/01/16 0450  WBC 0.9*  --  0.7* 0.8* 1.1* 2.7*  CREATININE 0.66  --   --  0.55  --  0.51  LATICACIDVEN  --  1.02  --   --   --   --     Estimated Creatinine Clearance: 67.8 mL/min (by C-G formula based on SCr of 0.51 mg/dL).    Allergies  Allergen Reactions  . Iron Nausea And Vomiting    Antimicrobials this admission: Vancomycin 7/17 >> 7/20 Zosyn 7/17 >>   Dose adjustments this admission: N/A  Microbiology results: 7/17 BCx: pending 7/17 UCx: pending  Thank you for allowing pharmacy to be a part of this patient's care.  Excell Seltzer, PharmD Clinical Pharmacist  09/01/2016 8:09 AM

## 2016-09-02 DIAGNOSIS — R0603 Acute respiratory distress: Secondary | ICD-10-CM

## 2016-09-02 LAB — CULTURE, BLOOD (ROUTINE X 2)
CULTURE: NO GROWTH
Culture: NO GROWTH

## 2016-09-02 LAB — BPAM PLATELET PHERESIS
BLOOD PRODUCT EXPIRATION DATE: 201807232359
Blood Product Expiration Date: 201807222359
ISSUE DATE / TIME: 201807211612
ISSUE DATE / TIME: 201807211410
UNIT TYPE AND RH: 6200
UNIT TYPE AND RH: 7300

## 2016-09-02 LAB — BASIC METABOLIC PANEL
Anion gap: 7 (ref 5–15)
BUN: 6 mg/dL (ref 6–20)
CHLORIDE: 104 mmol/L (ref 101–111)
CO2: 25 mmol/L (ref 22–32)
CREATININE: 0.45 mg/dL (ref 0.44–1.00)
Calcium: 8.8 mg/dL — ABNORMAL LOW (ref 8.9–10.3)
GFR calc Af Amer: >60 mL/min (ref >60)
GFR calc non Af Amer: >60 mL/min (ref >60)
GLUCOSE: 86 mg/dL (ref 65–99)
POTASSIUM: 3.9 mmol/L (ref 3.5–5.1)
Sodium: 136 mmol/L (ref 135–145)

## 2016-09-02 LAB — CBC
HCT: 23.5 % — ABNORMAL LOW (ref 36.0–46.0)
HEMOGLOBIN: 7.6 g/dL — AB (ref 12.0–15.0)
MCH: 27.4 pg (ref 26.0–34.0)
MCHC: 32.3 g/dL (ref 30.0–36.0)
MCV: 84.8 fL (ref 78.0–100.0)
PLATELETS: 61 10*3/uL — AB (ref 150–400)
RBC: 2.77 MIL/uL — AB (ref 3.87–5.11)
RDW: 18.9 % — ABNORMAL HIGH (ref 11.5–15.5)
WBC: 6.3 10*3/uL (ref 4.0–10.5)

## 2016-09-02 LAB — PREPARE PLATELET PHERESIS
UNIT DIVISION: 0
Unit division: 0

## 2016-09-02 LAB — GLUCOSE, CAPILLARY
GLUCOSE-CAPILLARY: 132 mg/dL — AB (ref 65–99)
Glucose-Capillary: 79 mg/dL (ref 65–99)
Glucose-Capillary: 119 mg/dL — ABNORMAL HIGH (ref 65–99)
Glucose-Capillary: 117 mg/dL — ABNORMAL HIGH (ref 65–99)

## 2016-09-02 MED ORDER — LEVALBUTEROL HCL 0.63 MG/3ML IN NEBU
0.6300 mg | INHALATION_SOLUTION | Freq: Three times a day (TID) | RESPIRATORY_TRACT | Status: DC | PRN
Start: 2016-09-02 — End: 2016-09-03

## 2016-09-02 MED ORDER — IPRATROPIUM BROMIDE 0.02 % IN SOLN: 0.5000 mg | Freq: Four times a day (QID) | RESPIRATORY_TRACT | Status: DC | PRN

## 2016-09-02 MED ORDER — IPRATROPIUM BROMIDE 0.02 % IN SOLN: 0.5000 mg | Freq: Three times a day (TID) | RESPIRATORY_TRACT | Status: DC

## 2016-09-02 MED ORDER — METOPROLOL TARTRATE 25 MG PO TABS
25.0000 mg | ORAL_TABLET | Freq: Two times a day (BID) | ORAL | Status: DC
  Administered 2016-09-02 – 2016-09-06 (×8): 25 mg via ORAL
  Filled 2016-09-02 (×8): qty 1

## 2016-09-02 MED ORDER — LEVALBUTEROL HCL 0.63 MG/3ML IN NEBU: 0.6300 mg | INHALATION_SOLUTION | Freq: Three times a day (TID) | RESPIRATORY_TRACT | Status: DC

## 2016-09-02 NOTE — Progress Notes (Signed)
[facility] PROGRESS NOTE  Heather Hinton EAV:[id number] DOB: [birth year] DOA: 08/28/2016 PCP: Neale Burly, MD  Brief summary   56 y.o. female with a PMH of metastatic lung cancer diagnosed 08/8293 complicated by bone and brain metastasis, status post radiation treatment to her brain, chest and bone. She began chemotherapy on 08/21/16 and was admitted 08/28/16 with shortness of breath. She was found to have neutropenia, tachycardia and tachypnea with no obvious infectious source.  Assessment/Plan:  SIRS (systemic inflammatory response syndrome) (HCC)rule out sepsis in a patient with neutropenia status post systemic chemotherapy: Unchanged Patient was placed on empiric vancomycin and Zosyn. Blood cultures 2 and urine culture sent. Urinalysis negative for signs of infection. Chest x-ray negative for infiltrates. CXR negative for infiltrates.Lactic acid normal. Although tachycardic and tachypneic, there was no hypoxia or elevated cardiac enzymes.  -Continue empiric treatment for possible sepsis. Tachycardia and tachypnea persist. remains intermittent  febrile, but neutropenia is improved . Blood pressure stable. Culture data remains negative. Discontinued Vancomycin.  Sinus tachycardia: Unchanged Pulmonary embolism ruled out. May be from sepsis, anemia. TSH WNL, no evidence of hyperthyroidism. The patient continues to be tachycardic. Heart rate remains elevated in the 110s. Cont iv fluids, started BB  Lower GI bleeding: New Status post 2 units of PRBCs since admission.  Hemoglobin stable at 7.6 this morning. Check AM, Will TF if needed.   Metastatic lung cancer (metastasis from lung to other site) (HCC)/bone and brain metastasis s/p systemic chemotherapy resulting in pancytopenia: Unstable Status post systemic chemotherapy withcarboplatin and Alimta on 08/21/16. Dr. Alvy Bimler aware of admission, will get a local oncologist to follow. Received Neupogen until [place] greater than 1000.  Platelet count continued to drop, then TFsed on 7/21. Today platelet is 61k. Monitor   Hyperglycemia: Stable Not known to be diabetic. May be from recent steroid use prior to and following chemotherapy. Follow-up hemoglobin A1c-5.3. Currently being managed with insulin sensitive SSI. Glucoses have been well controlled with no need for supplemental insulin:  Essential hypertension: Stable Blood pressure currently controlled. Not on antihypertensives at home.  Symptomatic anemia/iron deficiency anemia/anemia related to chemotherapy/B 12 deficiency anemia: Stable Hemoglobin stable, status post 2 units of PRBCs.  Cancer associated pain: Stable Continue home pain medications.  Severe protein-calorie malnutrition (HCC):Stable Continue multivitamin. Body mass index is 22.66 kg/m.  HIV screening Done 07/15/16: Nonreactive  Code Status: full Family Communication: d/w patient, Therapist, sports. No family at the bedside (indicate person spoken with, relationship, and if by phone, the number) Disposition Plan: awaiting clinical improvement    Consultants:  Oncology   Procedures:  none  Antibiotics:  Vancomycin 08/28/16--->  Zosyn 08/28/16--->   HPI/Subjective: Febrile last night. No acute chest pains, no SOB.   Objective: Vitals:   09/02/16 0500 09/02/16 0805  BP: (!) 101/43   Pulse: (!) 115 (!) 125  Resp: (!) 27 (!) 24  Temp:  99 F (37.2 C)    Intake/Output Summary (Last 24 hours) at 09/02/16 0827 Last data filed at 09/02/16 0500  Gross per 24 hour  Intake             1565 ml  Output              600 ml  Net              965 ml   Filed Weights   08/31/16 0500 09/01/16 0400 09/02/16 0500  Weight: 57.1 kg (125 lb 14.1 oz) 61.1 kg (134 lb 11.2 oz) 61.9 kg (136 lb 7.4 oz)  Exam:   General:  No distress   Cardiovascular: s1,s2 mild tachycardia   Respiratory: diminished in RLL  Abdomen: soft, nt, nd   Musculoskeletal: no leg edema    Data Reviewed: Basic Metabolic  Panel:  Recent Labs Lab 08/28/16 1159 08/30/16 0626 09/01/16 0450 09/02/16 0449  NA 133* 133* 133* 136  K 3.7 3.5 3.2* 3.9  CL 95* 99* 103 104  CO2 24 23 22 25   GLUCOSE 184* 96 99 86  BUN 20 10 7 6   CREATININE 0.66 0.55 0.51 0.45  CALCIUM 10.0 9.0 8.3* 8.8*   Liver Function Tests:  Recent Labs Lab 08/28/16 1159  AST 15  ALT 24  ALKPHOS 149*  BILITOT 1.0  PROT 6.7  ALBUMIN 2.4*    Recent Labs Lab 08/28/16 1159  LIPASE 29   No results for input(s): AMMONIA in the last 168 hours. CBC:  Recent Labs Lab 08/28/16 1159 08/29/16 1104 08/30/16 0626 08/31/16 0524 09/01/16 0450 09/02/16 0449  WBC 0.9* 0.7* 0.8* 1.1* 2.7* 6.3  NEUTROABS 0.7* 0.5* 0.6* 0.6* 2.1  --   HGB 8.0* 7.3* 7.9* 8.9* 7.9* 7.6*  HCT 25.0* 23.3* 24.5* 26.4* 24.1* 23.5*  MCV 83.3 83.8 84.5 83.8 84.0 84.8  PLT 79* 53* 35* 18* 14* 61*   Cardiac Enzymes: No results for input(s): CKTOTAL, CKMB, CKMBINDEX, TROPONINI in the last 168 hours. BNP (last 3 results)  Recent Labs  08/28/16 1600  BNP 61.0    ProBNP (last 3 results) No results for input(s): PROBNP in the last 8760 hours.  CBG:  Recent Labs Lab 08/31/16 2124 09/01/16 0716 09/01/16 1120 09/01/16 1610 09/02/16 0805  GLUCAP 99 84 79 75 79    Recent Results (from the past 240 hour(s))  Blood Culture (routine x 2)     Status: None   Collection Time: 08/28/16 12:47 PM  Result Value Ref Range Status   Specimen Description LEFT ANTECUBITAL  Final   Special Requests   Final    BOTTLES DRAWN AEROBIC AND ANAEROBIC Blood Culture results may not be optimal due to an inadequate volume of blood received in culture bottles   Culture NO GROWTH 5 DAYS  Final   Report Status 09/02/2016 FINAL  Final  Blood Culture (routine x 2)     Status: None   Collection Time: 08/28/16  1:24 PM  Result Value Ref Range Status   Specimen Description BLOOD RIGHT HAND  Final   Special Requests   Final    BOTTLES DRAWN AEROBIC AND ANAEROBIC Blood Culture  results may not be optimal due to an inadequate volume of blood received in culture bottles   Culture NO GROWTH 5 DAYS  Final   Report Status 09/02/2016 FINAL  Final  MRSA PCR Screening     Status: None   Collection Time: 08/28/16  4:25 PM  Result Value Ref Range Status   MRSA by PCR NEGATIVE NEGATIVE Final    Comment:        The GeneXpert MRSA Assay (FDA approved for NASAL specimens only), is one component of a comprehensive MRSA colonization surveillance program. It is not intended to diagnose MRSA infection nor to guide or monitor treatment for MRSA infections.      Studies: Dg Chest Port 1 View  Result Date: 09/01/2016 CLINICAL DATA:  Acute respiratory distress EXAM: PORTABLE CHEST 1 VIEW COMPARISON:  08/28/2016 FINDINGS: Right-sided central venous port tip overlies the SVC. Development of bilateral right greater than left ground-glass density throughout the lung fields. No large  effusion. Stable cardiomediastinal silhouette with enlarged mediastinal contour corresponding to CT demonstrated mass/adenopathy. No pneumothorax. IMPRESSION: 1. Interim development of bilateral right greater than left ground-glass density which may be secondary to asymmetric edema, or diffuse infection. 2. Stable enlarged mediastinal silhouette corresponding to adenopathy demonstrated on CT Electronically Signed   By: Donavan Foil M.D.   On: 09/01/2016 21:25    Scheduled Meds: . docusate sodium  100 mg Oral BID  . insulin aspart  0-5 Units Subcutaneous QHS  . insulin aspart  0-9 Units Subcutaneous TID WC  . ipratropium      . ipratropium  0.5 mg Nebulization TID  . levalbuterol      . levalbuterol  0.63 mg Nebulization TID  . metoprolol tartrate  12.5 mg Oral BID  . morphine  15 mg Oral Q12H  . multivitamin with minerals  1 tablet Oral Daily  . pantoprazole  40 mg Oral Daily  . polyethylene glycol  17 g Oral Daily  . pravastatin  20 mg Oral q1800  . sodium chloride flush  3 mL Intravenous Q12H    Continuous Infusions: . sodium chloride    . 0.9 % NaCl with KCl 20 mEq / L 100 mL (09/01/16 2337)  . piperacillin-tazobactam (ZOSYN)  IV 3.375 g (09/02/16 9326)    Principal Problem:   SIRS (systemic inflammatory response syndrome) (HCC) Active Problems:   Metastatic lung cancer (metastasis from lung to other site) [facility])   Essential hypertension   Antineoplastic chemotherapy induced pancytopenia ([place])   Cancer associated pain   Metastasis to bone (HCC)   Iron deficiency anemia   Metastasis to brain (HCC)   B12 deficiency anemia   Severe protein-calorie malnutrition ([place])   Sepsis ([place])   Lower GI bleed   Pressure injury of skin    Time spent: >35 minutes     Kinnie Feil  [facility] Pager [phone]. If 7PM-7AM, please contact night-coverage at [email or web address], password [email or web address] 09/02/2016, 8:27 AM  LOS: 5 days

## 2016-09-03 ENCOUNTER — Inpatient Hospital Stay (HOSPITAL_COMMUNITY): Payer: Medicaid Other

## 2016-09-03 ENCOUNTER — Encounter: Payer: Self-pay | Admitting: Urology

## 2016-09-03 ENCOUNTER — Telehealth: Payer: Self-pay | Admitting: *Deleted

## 2016-09-03 DIAGNOSIS — R0603 Acute respiratory distress: Secondary | ICD-10-CM

## 2016-09-03 LAB — CBC
HEMATOCRIT: 24.2 % — AB (ref 36.0–46.0)
Hemoglobin: 7.8 g/dL — ABNORMAL LOW (ref 12.0–15.0)
MCH: 27.6 pg (ref 26.0–34.0)
MCHC: 32.2 g/dL (ref 30.0–36.0)
MCV: 85.5 fL (ref 78.0–100.0)
PLATELETS: 56 10*3/uL — AB (ref 150–400)
RBC: 2.83 MIL/uL — ABNORMAL LOW (ref 3.87–5.11)
RDW: 18.9 % — AB (ref 11.5–15.5)
WBC: 10.2 10*3/uL (ref 4.0–10.5)

## 2016-09-03 LAB — GLUCOSE, CAPILLARY
GLUCOSE-CAPILLARY: 78 mg/dL (ref 65–99)
GLUCOSE-CAPILLARY: 57 mg/dL — AB (ref 65–99)
GLUCOSE-CAPILLARY: 77 mg/dL (ref 65–99)
Glucose-Capillary: 78 mg/dL (ref 65–99)
Glucose-Capillary: 86 mg/dL (ref 65–99)

## 2016-09-03 LAB — PREPARE RBC (CROSSMATCH)

## 2016-09-03 MED ORDER — LEVALBUTEROL HCL 0.63 MG/3ML IN NEBU: 0.6300 mg | INHALATION_SOLUTION | Freq: Four times a day (QID) | RESPIRATORY_TRACT | Status: DC | PRN

## 2016-09-03 MED ORDER — SODIUM CHLORIDE 0.9 % IV SOLN
Freq: Once | INTRAVENOUS | Status: AC
  Administered 2016-09-03: 12:00:00 via INTRAVENOUS

## 2016-09-03 NOTE — Progress Notes (Signed)
Dr. Allyson Sabal notified of ac evening meal blood sugar of 57. Treated with 4 oz juice. Recheck BS 85. Patient asymptomatic with low BS.

## 2016-09-03 NOTE — Care Management Note (Signed)
Case Management Note  Patient Details  Name: Heather Hinton MRN: [id number] Date of Birth: [birth year]  Subjective/Objective:   Adm from home with SIRS. From home alone. Recently started chemotherapy for lung cancer with mets to brain and bone. Patient reports a decline in mobility since chemotherapy.    Action/Plan: Patient would benefit from short term rehab or [facility] but does not have a payor source. Patient asks to speak with CSW in regards to long term placement . CSW notified. CM will follow.   Expected Discharge Date:       09/05/2016           Expected Discharge Plan:     In-House Referral:     Discharge planning Services  CM Consult  Post Acute Care Choice:    Choice offered to:     DME Arranged:    DME Agency:     HH Arranged:    HH Agency:     Status of Service:  In process, will continue to follow  If discussed at Long Length of Stay Meetings, dates discussed:    Additional Comments:  Brenden Rudman, Chauncey [place], RN 09/03/2016, 3:26 PM

## 2016-09-03 NOTE — Evaluation (Signed)
Physical Therapy Evaluation Patient Details Name: Heather Hinton MRN: [id number] DOB: 1960/10/28 Today's Date: 09/03/2016   History of Present Illness  Heather Hinton is a 56yo black female who comes to [facility] on 7/19, admitted with SIRS. 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 Pt reports starting chemo abotu 2 weeks ago, and has since had a signiificant decline in mobility and strength. 6WA pt was a community amb without limitations. She currently lives with husband who works M-F 6-6 and 57yo granddaughter.   Clinical Impression  Pt admitted with above diagnosis. Pt currently with functional limitations due to the deficits listed below (see "PT Problem List"). Upon entry, the patient is received semirecumbent in bed, no family/caregiver present. The pt is awake and agreeable to participate. No acute distress noted at this time. Pt is with heavy global weakness, requiring maxA for transfers sit-to-stand and stand-pivot to chair, and is unable to AMB meaningful distances at this time. Pt is unable to access the home currently and has insufficient support. Upon standing, HR increasing to 130s after only 30 seconds of supported standing. Factors limiting a clear PT prognosis include the extent of the Left hip lesion, and overall prognosis/life expectancy related to metastatic CA. She would benefit from STR level PT to regain strength and mobility, and to provide training for transfers and WC use. She would benefit from [facility] and [facility] upon return to home, to improve safe access of home and toileting. Recommending supervision for all mobility at this time, including sitting EOB and transfers. Pt will benefit from skilled PT intervention to increase independence and safety with basic mobility in preparation for discharge to the venue listed below.       Follow Up Recommendations Supervision for mobility/OOB;SNF (Pt is severely deconditioned, weak, unable to AMB. Warrants  high-frequency high-volume PT services. Unlikely that her insurance wil cover any PT treatments. )    Equipment Recommendations  Wheelchair (measurements PT);Wheelchair cushion (measurements PT);3in1 (PT) (pediatric-WC, anti-tippers )    Recommendations for Other Services       Precautions / Restrictions Precautions Precautions: Fall Precaution Comments: Left hip lesion Restrictions Weight Bearing Restrictions: No      Mobility  Bed Mobility Overal bed mobility: Needs Assistance Bed Mobility: Supine to Sit     Supine to sit: Min assist     General bed mobility comments: able to perform 80% of task with modI but requires heavy effort, and additional time.   Transfers Overall transfer level: Needs assistance Equipment used: None Transfers: Sit to/from [facility] Sit to Stand: Max assist Stand pivot transfers: Max assist;From elevated surface       General transfer comment: requires physical assist for trunk support and weight shifting; legs feel like 'jello'   Ambulation/Gait Ambulation/Gait assistance:  (unable at this time )              Financial trader Rankin (Stroke Patients Only)       Balance Overall balance assessment: Needs assistance         Standing balance support: Bilateral upper extremity supported;During functional activity Standing balance-Leahy Scale: Zero                               Pertinent Vitals/Pain Pain Assessment: No/denies pain    Home Living Family/patient expects to be  discharged to:: Private residence Living Arrangements: Spouse/significant other Available Help at Discharge: Family Type of Home: House Home Access: Stairs to enter Entrance Stairs-Rails: Psychiatric nurse of Steps: [place]: One level [place]: [place] - 2 wheels;Shower seat - built in      Prior Function Level of Independence:  Independent         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      Hand Dominance   Dominant Hand: Right    Extremity/Trunk Assessment   Upper Extremity Assessment Upper Extremity Assessment: Generalized weakness    Lower Extremity Assessment Lower Extremity Assessment: Generalized weakness       Communication   Communication: No difficulties  Cognition Arousal/Alertness: Awake/alert Behavior During Therapy: WFL for tasks assessed/performed Overall Cognitive Status: Within Functional Limits for tasks assessed                                        General Comments      Exercises     Assessment/Plan    PT Assessment Patient needs continued PT services  PT Problem List Decreased strength;Decreased activity tolerance;Decreased balance;Decreased mobility;Decreased knowledge of use of DME;Decreased knowledge of precautions       PT Treatment Interventions DME instruction;Functional mobility training;Therapeutic activities;Therapeutic exercise;Balance training    PT Goals (Current goals can be found in the Care Plan section)  Acute Rehab PT Goals Patient Stated Goal: regain strength for independent transfers  PT Goal Formulation: With patient Time For Goal Achievement: 09/17/16 Potential to Achieve Goals: Fair    Frequency Min 2X/week   Barriers to discharge Inaccessible home environment;Decreased caregiver support pt is alone most of time due to husbands work schedule     Co-evaluation               AM-PAC PT "6 Clicks" Daily Activity  Outcome Measure Difficulty turning over in bed (including adjusting bedclothes, sheets and blankets)?: A Lot Difficulty moving from lying on back to sitting on the side of the bed? : Total Difficulty sitting down on and standing up from a chair with arms (e.g., wheelchair, bedside commode, etc,.)?: Total Help needed moving to and from a bed to chair (including a wheelchair)?:  Total Help needed walking in hospital room?: Total Help needed climbing 3-5 steps with a railing? : Total 6 Click Score: 7    End of Session Equipment Utilized During Treatment: Gait belt Activity Tolerance: Patient limited by fatigue;Treatment limited secondary to medical complications (Comment) (tachy 130s) Patient left: in chair;with call bell/phone within reach Nurse Communication: Mobility status PT Visit Diagnosis: Unsteadiness on feet (R26.81);Muscle weakness (generalized) (M62.81);Difficulty in walking, not elsewhere classified (R26.2)    Time: 7124-5809 PT Time Calculation (min) (ACUTE ONLY): 30 min   Charges:   PT Evaluation $PT Eval Moderate Complexity: 1 Procedure PT Treatments $Therapeutic Activity: 8-22 mins   PT G Codes:        12:50 PM, 21-Sep-2016 Etta Grandchild, PT, DPT Physical Therapist - [place] [phone] (Office)    Peytan Andringa C 2016-09-21, 12:43 PM

## 2016-09-03 NOTE — Progress Notes (Signed)
[facility] PROGRESS NOTE  Heather Hinton LZJ:[id number] DOB: 1960-04-20 DOA: 08/28/2016 PCP: Neale Burly, MD  Brief summary   56 y.o. female with a PMH of metastatic lung cancer diagnosed 0/2409 complicated by bone and brain metastasis, status post radiation treatment to her brain, chest and bone. She began chemotherapy on 08/21/16 and was admitted 08/28/16 with shortness of breath. She was found to have neutropenia, tachycardia and tachypnea with no obvious infectious source.   Assessment/Plan: SIRS (systemic inflammatory response syndrome) (HCC)rule out sepsis in a patient with neutropenia status post systemic chemotherapy: Unchanged Patient was placed on empiric vancomycin and Zosyn initially. Blood cultures 2 no growth so far. Urinalysis negative for signs of infection. Chest x-ray suggestive of pneumonia versus edema.Lactic acid normal. Although tachycardic and tachypneic, there was no hypoxia or elevated cardiac enzymes.  -Continue Zosyn possible sepsis.  neutropenia is improved . Blood pressure stable. Culture data remains negative.    Sinus tachycardia: Likely secondary to anemia Pulmonary embolism ruled out. May be from sepsis, anemia. TSH WNL, no evidence of hyperthyroidism. The patient continues to be tachycardic. Heart rate remains elevated in the 110s.   Lower GI bleeding: New this admission, now resolved Status post 2 units of PRBCs since admission.  Hemoglobin stable at 7.9 >7.6 >7.8 this morning. As per oncology maintain hemoglobin between 8-9. We will transfuse 1 more unit today  Metastatic lung cancer (metastasis from lung to other site) (HCC)/bone and brain metastasis s/p systemic chemotherapy resulting in pancytopenia: Unstable Status post systemic chemotherapy withcarboplatin and Alimta on 08/21/16. Dr. Alvy Bimler aware of admission, will get a local oncologist to follow. Received Neupogen until [place] greater than 1000. Status post 2 units of platelets. Platelet  count 61>56  Hyperglycemia: Stable Not known to be diabetic. May be from recent steroid use prior to and following chemotherapy. Follow-up hemoglobin A1c-5.3. Currently being managed with insulin sensitive SSI. Glucoses have been well controlled with no need for supplemental insulin:  Essential hypertension: Stable Blood pressure currently controlled. Not on antihypertensives at home.  Symptomatic anemia/iron deficiency anemia/anemia related to chemotherapy/B 12 deficiency anemia: Stable Hemoglobin stable, status post 2 units of PRBCs.. Transfuse 1 more unit today   Cancer associated pain: Stable Continue home pain medications.  Severe protein-calorie malnutrition (HCC):Stable Continue multivitamin. Body mass index is 22.66 kg/m.  HIV screening Done 07/15/16: Nonreactive  Code Status: full Family Communication: d/w patient, Therapist, sports. No family at the bedside (indicate person spoken with, relationship, and if by phone, the number) Disposition Plan: Continue stepdown   Consultants:  Oncology   Procedures:  none  Antibiotics:  Vancomycin 08/28/16--->  Zosyn 08/28/16--->   HPI/Subjective: Febrile last night. No acute chest pains, no SOB.   Objective: Vitals:   09/03/16 0600 09/03/16 0700  BP: (!) 107/59 115/66  Pulse: (!) 116 (!) 119  Resp: (!) 29 18  Temp:      Intake/Output Summary (Last 24 hours) at 09/03/16 0902 Last data filed at 09/03/16 0500  Gross per 24 hour  Intake             1475 ml  Output             1150 ml  Net              325 ml   Filed Weights   09/01/16 0400 09/02/16 0500 09/03/16 0500  Weight: 61.1 kg (134 lb 11.2 oz) 61.9 kg (136 lb 7.4 oz) 61.1 kg (134 lb 11.2 oz)    Exam:  General:  No distress   Cardiovascular: s1,s2 mild tachycardia   Respiratory: diminished in RLL  Abdomen: soft, nt, nd   Musculoskeletal: no leg edema    Data Reviewed: Basic Metabolic Panel:  Recent Labs Lab 08/28/16 1159 08/30/16 0626  09/01/16 0450 09/02/16 0449  NA 133* 133* 133* 136  K 3.7 3.5 3.2* 3.9  CL 95* 99* 103 104  CO2 24 23 22 25   GLUCOSE 184* 96 99 86  BUN 20 10 7 6   CREATININE 0.66 0.55 0.51 0.45  CALCIUM 10.0 9.0 8.3* 8.8*   Liver Function Tests:  Recent Labs Lab 08/28/16 1159  AST 15  ALT 24  ALKPHOS 149*  BILITOT 1.0  PROT 6.7  ALBUMIN 2.4*    Recent Labs Lab 08/28/16 1159  LIPASE 29   No results for input(s): AMMONIA in the last 168 hours. CBC:  Recent Labs Lab 08/28/16 1159 08/29/16 1104 08/30/16 0626 08/31/16 0524 09/01/16 0450 09/02/16 0449 09/03/16 0515  WBC 0.9* 0.7* 0.8* 1.1* 2.7* 6.3 10.2  NEUTROABS 0.7* 0.5* 0.6* 0.6* 2.1  --   --   HGB 8.0* 7.3* 7.9* 8.9* 7.9* 7.6* 7.8*  HCT 25.0* 23.3* 24.5* 26.4* 24.1* 23.5* 24.2*  MCV 83.3 83.8 84.5 83.8 84.0 84.8 85.5  PLT 79* 53* 35* 18* 14* 61* 56*   Cardiac Enzymes: No results for input(s): CKTOTAL, CKMB, CKMBINDEX, TROPONINI in the last 168 hours. BNP (last 3 results)  Recent Labs  08/28/16 1600  BNP 61.0    ProBNP (last 3 results) No results for input(s): PROBNP in the last 8760 hours.  CBG:  Recent Labs Lab 09/02/16 0805 09/02/16 1054 09/02/16 1551 09/02/16 2201 09/03/16 0736  GLUCAP 79 119* 117* 132* 78    Recent Results (from the past 240 hour(s))  Blood Culture (routine x 2)     Status: None   Collection Time: 08/28/16 12:47 PM  Result Value Ref Range Status   Specimen Description LEFT ANTECUBITAL  Final   Special Requests   Final    BOTTLES DRAWN AEROBIC AND ANAEROBIC Blood Culture results may not be optimal due to an inadequate volume of blood received in culture bottles   Culture NO GROWTH 5 DAYS  Final   Report Status 09/02/2016 FINAL  Final  Blood Culture (routine x 2)     Status: None   Collection Time: 08/28/16  1:24 PM  Result Value Ref Range Status   Specimen Description BLOOD RIGHT HAND  Final   Special Requests   Final    BOTTLES DRAWN AEROBIC AND ANAEROBIC Blood Culture  results may not be optimal due to an inadequate volume of blood received in culture bottles   Culture NO GROWTH 5 DAYS  Final   Report Status 09/02/2016 FINAL  Final  MRSA PCR Screening     Status: None   Collection Time: 08/28/16  4:25 PM  Result Value Ref Range Status   MRSA by PCR NEGATIVE NEGATIVE Final    Comment:        The GeneXpert MRSA Assay (FDA approved for NASAL specimens only), is one component of a comprehensive MRSA colonization surveillance program. It is not intended to diagnose MRSA infection nor to guide or monitor treatment for MRSA infections.      Studies: Dg Chest Port 1 View  Result Date: 09/01/2016 CLINICAL DATA:  Acute respiratory distress EXAM: PORTABLE CHEST 1 VIEW COMPARISON:  08/28/2016 FINDINGS: Right-sided central venous port tip overlies the SVC. Development of bilateral right greater than left ground-glass  density throughout the lung fields. No large effusion. Stable cardiomediastinal silhouette with enlarged mediastinal contour corresponding to CT demonstrated mass/adenopathy. No pneumothorax. IMPRESSION: 1. Interim development of bilateral right greater than left ground-glass density which may be secondary to asymmetric edema, or diffuse infection. 2. Stable enlarged mediastinal silhouette corresponding to adenopathy demonstrated on CT Electronically Signed   By: Donavan Foil M.D.   On: 09/01/2016 21:25    Scheduled Meds: . docusate sodium  100 mg Oral BID  . insulin aspart  0-5 Units Subcutaneous QHS  . insulin aspart  0-9 Units Subcutaneous TID WC  . metoprolol tartrate  25 mg Oral BID  . morphine  15 mg Oral Q12H  . multivitamin with minerals  1 tablet Oral Daily  . pantoprazole  40 mg Oral Daily  . polyethylene glycol  17 g Oral Daily  . pravastatin  20 mg Oral q1800  . sodium chloride flush  3 mL Intravenous Q12H   Continuous Infusions: . sodium chloride    . 0.9 % NaCl with KCl 20 mEq / L 1 mL (09/02/16 1832)  .  piperacillin-tazobactam (ZOSYN)  IV 3.375 g (09/03/16 0540)    Principal Problem:   SIRS (systemic inflammatory response syndrome) (HCC) Active Problems:   Metastatic lung cancer (metastasis from lung to other site) [facility])   Essential hypertension   Antineoplastic chemotherapy induced pancytopenia ([place])   Cancer associated pain   Metastasis to bone (HCC)   Iron deficiency anemia   Metastasis to brain (HCC)   B12 deficiency anemia   Severe protein-calorie malnutrition ([place])   Sepsis (HCC)   Lower GI bleed   Pressure injury of skin   Acute respiratory distress    Time spent: >35 minutes     Keenen Roessner  [facility]  . If 7PM-7AM, please contact night-coverage at [email or web address], password [facility] 09/03/2016, 9:02 AM  LOS: 6 days

## 2016-09-03 NOTE — Telephone Encounter (Signed)
"  This is Garvin Fila calling to make sure appointments have been rescheduled.  She is in the hospital at [facility].  What is the goal?  Will she be transferred to [facility], have the port-a-cath placed and start chemotherapy?  We do not have a preference of where care is received.  It's been a whirl wind so we want to know what is going on and make sure she is getting what she needs."  Return number [phone].

## 2016-09-03 NOTE — Telephone Encounter (Signed)
Notified that appts cancelled. Will be rescheduled after she is discharged.

## 2016-09-04 ENCOUNTER — Telehealth: Payer: Self-pay | Admitting: *Deleted

## 2016-09-04 LAB — CBC
HEMATOCRIT: 25.5 % — AB (ref 36.0–46.0)
Hemoglobin: 8.5 g/dL — ABNORMAL LOW (ref 12.0–15.0)
MCH: 28.6 pg (ref 26.0–34.0)
MCHC: 33.3 g/dL (ref 30.0–36.0)
MCV: 85.9 fL (ref 78.0–100.0)
PLATELETS: 58 10*3/uL — AB (ref 150–400)
RBC: 2.97 MIL/uL — ABNORMAL LOW (ref 3.87–5.11)
RDW: 17.7 % — ABNORMAL HIGH (ref 11.5–15.5)
WBC: 13.4 10*3/uL — AB (ref 4.0–10.5)

## 2016-09-04 LAB — TYPE AND SCREEN
ABO/RH(D): A POS
Antibody Screen: NEGATIVE
UNIT DIVISION: 0

## 2016-09-04 LAB — COMPREHENSIVE METABOLIC PANEL
ALBUMIN: 1.5 g/dL — AB (ref 3.5–5.0)
ALK PHOS: 162 U/L — AB (ref 38–126)
ALT: 25 U/L (ref 14–54)
ANION GAP: 10 (ref 5–15)
AST: 26 U/L (ref 15–41)
BILIRUBIN TOTAL: 0.8 mg/dL (ref 0.3–1.2)
BUN: 8 mg/dL (ref 6–20)
CALCIUM: 8.9 mg/dL (ref 8.9–10.3)
CO2: 23 mmol/L (ref 22–32)
Chloride: 102 mmol/L (ref 101–111)
Creatinine, Ser: 0.56 mg/dL (ref 0.44–1.00)
GFR calc Af Amer: >60 mL/min (ref >60)
GFR calc non Af Amer: >60 mL/min (ref >60)
GLUCOSE: 93 mg/dL (ref 65–99)
POTASSIUM: 3.7 mmol/L (ref 3.5–5.1)
SODIUM: 135 mmol/L (ref 135–145)
TOTAL PROTEIN: 5 g/dL — AB (ref 6.5–8.1)

## 2016-09-04 LAB — GLUCOSE, CAPILLARY
Glucose-Capillary: 143 mg/dL — ABNORMAL HIGH (ref 65–99)
Glucose-Capillary: 80 mg/dL (ref 65–99)
Glucose-Capillary: 127 mg/dL — ABNORMAL HIGH (ref 65–99)
Glucose-Capillary: 72 mg/dL (ref 65–99)

## 2016-09-04 LAB — BPAM RBC
BLOOD PRODUCT EXPIRATION DATE: 201807282359
ISSUE DATE / TIME: 201807231214
Unit Type and Rh: 5100

## 2016-09-04 MED ORDER — PIPERACILLIN-TAZOBACTAM 3.375 G IVPB
3.3750 g | Freq: Three times a day (TID) | INTRAVENOUS | Status: AC
  Administered 2016-09-04 (×2): 3.375 g via INTRAVENOUS
  Filled 2016-09-04 (×2): qty 50

## 2016-09-04 MED ORDER — GUAIFENESIN-DM 100-10 MG/5ML PO SYRP
5.0000 mL | ORAL_SOLUTION | ORAL | Status: DC | PRN
  Administered 2016-09-04: 5 mL via ORAL
  Filled 2016-09-04: qty 5

## 2016-09-04 NOTE — Progress Notes (Addendum)
Pharmacy Antibiotic Note  Heather Hinton is a 56 y.o. female admitted on 08/28/2016 with sepsis.  Pharmacy has been consulted for zosyn dosing.  WBC elevated.    Plan: Cont zosyn 3.375 gm IV q8 hours (will stop after today per MD) F/U cxs and clinical progress   Height: 5\' 4"  (162.6 cm) Weight: 134 lb 11.2 oz (61.1 kg) IBW/kg (Calculated) : 54.7  Temp (24hrs), Avg:98.5 F (36.9 C), Min:97.7 F (36.5 C), Max:99.7 F (37.6 C)   Recent Labs Lab 08/28/16 1322  08/30/16 0626 08/31/16 0524 09/01/16 0450 09/02/16 0449 09/03/16 0515 09/04/16 0506  WBC  --   < > 0.8* 1.1* 2.7* 6.3 10.2 13.4*  CREATININE  --   --  0.55  --  0.51 0.45  --  0.56  LATICACIDVEN 1.02  --   --   --   --   --   --   --   < > = values in this interval not displayed.  Estimated Creatinine Clearance: 67.8 mL/min (by C-G formula based on SCr of 0.56 mg/dL).    Allergies  Allergen Reactions  . Iron Nausea And Vomiting   Antimicrobials this admission: Vancomycin 7/17 >> 7/20 Zosyn 7/17 >> 7/25  Dose adjustments this admission: N/A  Microbiology results: 7/17 BCx: pending 7/17 UCx: pending  Thank you for allowing pharmacy to be a part of this patient's care.  Hart Robinsons, PharmD Clinical Pharmacist Pager:  [phone] 09/04/2016   09/04/2016 12:52 PM

## 2016-09-04 NOTE — Telephone Encounter (Signed)
I do not work at [facility]. It is up to the doctors there to decide whether it is appropriate to transfer her to [facility] or not With recent sepsis, we prefer to wait at least a week before scheduling a port

## 2016-09-04 NOTE — Care Management Note (Signed)
Case Management Note  Patient Details  Name: Heather Hinton MRN: [id number] Date of Birth: 1961-01-28  Status of Service:  In process, will continue to follow  If discussed at Long Length of Stay Meetings, dates discussed:   09/05/2016 Additional Comments:  Cerissa Zeiger, Chauncey Reading, RN 09/04/2016, 10:42 AM

## 2016-09-04 NOTE — Progress Notes (Addendum)
[facility] PROGRESS NOTE  [facility] TJQ:[id number] DOB: 08-02-1960 DOA: 08/28/2016 PCP: Neale Burly, MD  Brief summary   56 y.o. female with a PMH of metastatic lung cancer diagnosed 08/6224 complicated by bone and brain metastasis, status post radiation treatment to her brain, chest and bone. She began chemotherapy on 08/21/16 and was admitted 08/28/16 with shortness of breath. She was found to have neutropenia, tachycardia and tachypnea with no obvious infectious source.   Assessment/Plan: SIRS (systemic inflammatory response syndrome) (HCC)rule out sepsis in a patient with neutropenia status post systemic chemotherapy: Unchanged Patient was placed on empiric vancomycin and Zosyn initially. Blood cultures 2 no growth so far. Urinalysis negative for signs of infection. Chest x-ray suggestive of pneumonia versus edema.Lactic acid normal. Although tachycardic and tachypneic, there was no hypoxia or elevated cardiac enzymes. Patient received vancomycin from 7/17-7/20. Patient has been on Zosyn since 7/17. We'll DC Zosyn after today's dose. Neutropenia resolved, patient has completed empiric antibiotics . Blood pressure stable. Culture data remains negative.    Sinus tachycardia: Likely secondary to anemia Pulmonary embolism ruled out. May be from sepsis, anemia. TSH WNL, no evidence of hyperthyroidism. The patient continues to be tachycardic. Heart rate remains elevated in the 110s.   Lower GI bleeding: New this admission, now resolved Status post 3 units of PRBCs since admission.  Hemoglobin stable at 7.9 >7.6 >7.8 >8.5 this morning. As per oncology maintain hemoglobin between 8-9.    Metastatic lung cancer (metastasis from lung to other site) (HCC)/bone and brain metastasis s/p systemic chemotherapy resulting in pancytopenia: Unstable Status post systemic chemotherapy withcarboplatin and Alimta on 08/21/16. Dr. Alvy Bimler aware of admission, will get a local oncologist to  follow. Received Neupogen until [place] greater than 1000. Status post 2 units of platelets. Platelet count 61>56>58 Discussed with Dr. Alvy Bimler, Ernst Spell, MD 7/24-she does not recommend any chemotherapy at this time. She would like to wait for patient to become physically able to receive chemotherapy. She will set up an outpatient appointment at [facility] next week  Hyperglycemia: Stable Not known to be diabetic. May be from recent steroid use prior to and following chemotherapy. Follow-up hemoglobin A1c-5.3. Currently being managed with insulin sensitive SSI.   Essential hypertension: Stable Blood pressure currently controlled. Not on antihypertensives at home.  Symptomatic anemia/iron deficiency anemia/anemia related to chemotherapy/B 12 deficiency anemia: Stable Hemoglobin stable, status post 3 units of PRBCs..     Cancer associated pain: Stable Continue home pain medications.  Severe protein-calorie malnutrition (HCC):Stable Continue multivitamin. Body mass index is 22.66 kg/m.  HIV screening Done 07/15/16: Nonreactive  Left hip pain-Large expansile lucent lesion within the left inferior pubic ramus and likely extending to the inferior acetabulum concerning for metastatic bone lesion given history of lung cancer. There is no obvious fracture or dislocation.  Code Status: full Family Communication: d/w patient, Therapist, sports. No family at the bedside (indicate person spoken with, relationship, and if by phone, the number) Disposition Plan: Transfer to telemetry, will need SNF   Consultants:  Oncology   Procedures:  none  Antibiotics: Anti-infectives    Start     Dose/Rate Route Frequency Ordered Stop   08/29/16 0200  vancomycin (VANCOCIN) IVPB 750 mg/150 ml premix  Status:  Discontinued     750 mg 150 mL/hr over 60 Minutes Intravenous Every 12 hours 08/28/16 1649 08/31/16 0948   08/28/16 2000  piperacillin-tazobactam (ZOSYN) IVPB 3.375 g     3.375 g 12.5 mL/hr over 240 Minutes  Intravenous Every 8 hours 08/28/16  1649     08/28/16 1300  piperacillin-tazobactam (ZOSYN) IVPB 3.375 g  Status:  Discontinued     3.375 g 100 mL/hr over 30 Minutes Intravenous  Once 08/28/16 1246 09/01/16 0811   08/28/16 1300  vancomycin (VANCOCIN) IVPB 1000 mg/200 mL premix  Status:  Discontinued     1,000 mg 200 mL/hr over 60 Minutes Intravenous  Once 08/28/16 1246 08/28/16 1249   08/28/16 1300  vancomycin (VANCOCIN) 1,250 mg in sodium chloride 0.9 % 250 mL IVPB     1,250 mg 166.7 mL/hr over 90 Minutes Intravenous  Once 08/28/16 1249 08/28/16 1550          HPI/Subjective: Denies any chest pain, shortness of breath, afebrile overnight   Objective: Vitals:   09/04/16 0600 09/04/16 0800  BP: 92/73 97/79  Pulse: (!) 107 (!) 108  Resp: 19 (!) 23  Temp:  98.1 F (36.7 C)    Intake/Output Summary (Last 24 hours) at 09/04/16 0930 Last data filed at 09/04/16 0847  Gross per 24 hour  Intake             1095 ml  Output              200 ml  Net              895 ml   Filed Weights   09/01/16 0400 09/02/16 0500 09/03/16 0500  Weight: 61.1 kg (134 lb 11.2 oz) 61.9 kg (136 lb 7.4 oz) 61.1 kg (134 lb 11.2 oz)    Exam:   General:  No distress   Cardiovascular: s1,s2 mild tachycardia   Respiratory: diminished in RLL  Abdomen: soft, nt, nd   Musculoskeletal: no leg edema    Data Reviewed: Basic Metabolic Panel:  Recent Labs Lab 08/28/16 1159 08/30/16 0626 09/01/16 0450 09/02/16 0449 09/04/16 0506  NA 133* 133* 133* 136 135  K 3.7 3.5 3.2* 3.9 3.7  CL 95* 99* 103 104 102  CO2 24 23 22 25 23   GLUCOSE 184* 96 99 86 93  BUN 20 10 7 6 8   CREATININE 0.66 0.55 0.51 0.45 0.56  CALCIUM 10.0 9.0 8.3* 8.8* 8.9   Liver Function Tests:  Recent Labs Lab 08/28/16 1159 09/04/16 0506  AST 15 26  ALT 24 25  ALKPHOS 149* 162*  BILITOT 1.0 0.8  PROT 6.7 5.0*  ALBUMIN 2.4* 1.5*    Recent Labs Lab 08/28/16 1159  LIPASE 29   No results for input(s): AMMONIA in the  last 168 hours. CBC:  Recent Labs Lab 08/28/16 1159 08/29/16 1104 08/30/16 0626 08/31/16 0524 09/01/16 0450 09/02/16 0449 09/03/16 0515 09/04/16 0506  WBC 0.9* 0.7* 0.8* 1.1* 2.7* 6.3 10.2 13.4*  NEUTROABS 0.7* 0.5* 0.6* 0.6* 2.1  --   --   --   HGB 8.0* 7.3* 7.9* 8.9* 7.9* 7.6* 7.8* 8.5*  HCT 25.0* 23.3* 24.5* 26.4* 24.1* 23.5* 24.2* 25.5*  MCV 83.3 83.8 84.5 83.8 84.0 84.8 85.5 85.9  PLT 79* 53* 35* 18* 14* 61* 56* 58*   Cardiac Enzymes: No results for input(s): CKTOTAL, CKMB, CKMBINDEX, TROPONINI in the last 168 hours. BNP (last 3 results)  Recent Labs  08/28/16 1600  BNP 61.0    ProBNP (last 3 results) No results for input(s): PROBNP in the last 8760 hours.  CBG:  Recent Labs Lab 09/03/16 1627 09/03/16 1718 09/03/16 2141 09/04/16 0137 09/04/16 0736  GLUCAP 57* 86 77 143* 80    Recent Results (from the past 240 hour(s))  Blood  Culture (routine x 2)     Status: None   Collection Time: 08/28/16 12:47 PM  Result Value Ref Range Status   Specimen Description LEFT ANTECUBITAL  Final   Special Requests   Final    BOTTLES DRAWN AEROBIC AND ANAEROBIC Blood Culture results may not be optimal due to an inadequate volume of blood received in culture bottles   Culture NO GROWTH 5 DAYS  Final   Report Status 09/02/2016 FINAL  Final  Blood Culture (routine x 2)     Status: None   Collection Time: 08/28/16  1:24 PM  Result Value Ref Range Status   Specimen Description BLOOD RIGHT HAND  Final   Special Requests   Final    BOTTLES DRAWN AEROBIC AND ANAEROBIC Blood Culture results may not be optimal due to an inadequate volume of blood received in culture bottles   Culture NO GROWTH 5 DAYS  Final   Report Status 09/02/2016 FINAL  Final  MRSA PCR Screening     Status: None   Collection Time: 08/28/16  4:25 PM  Result Value Ref Range Status   MRSA by PCR NEGATIVE NEGATIVE Final    Comment:        The GeneXpert MRSA Assay (FDA approved for NASAL specimens only), is  one component of a comprehensive MRSA colonization surveillance program. It is not intended to diagnose MRSA infection nor to guide or monitor treatment for MRSA infections.      Studies: Dg Hip Unilat With Pelvis Min 4 Views Left  Result Date: 09/03/2016 CLINICAL DATA:  Metastatic lung cancer weakness in the left hip EXAM: DG HIP (WITH OR WITHOUT PELVIS) 1V*L* COMPARISON:  05/03/2016 FINDINGS: SI joints are symmetric. No acute displaced fracture or malalignment. Expansile lucent lesion within the left inferior pubic ramus and extending to the acetabulum with bony destruction evident. IMPRESSION: 1. Large expansile lucent lesion within the left inferior pubic ramus and likely extending to the inferior acetabulum concerning for metastatic bone lesion given history of lung cancer. There is no obvious fracture or dislocation. Electronically Signed   By: Donavan Foil M.D.   On: 09/03/2016 17:03    Scheduled Meds: . docusate sodium  100 mg Oral BID  . insulin aspart  0-5 Units Subcutaneous QHS  . insulin aspart  0-9 Units Subcutaneous TID WC  . metoprolol tartrate  25 mg Oral BID  . morphine  15 mg Oral Q12H  . multivitamin with minerals  1 tablet Oral Daily  . pantoprazole  40 mg Oral Daily  . polyethylene glycol  17 g Oral Daily  . pravastatin  20 mg Oral q1800  . sodium chloride flush  3 mL Intravenous Q12H   Continuous Infusions: . 0.9 % NaCl with KCl 20 mEq / L 125 mL/hr at 09/04/16 0421  . piperacillin-tazobactam (ZOSYN)  IV 3.375 g (09/04/16 0516)    Principal Problem:   SIRS (systemic inflammatory response syndrome) (HCC) Active Problems:   Metastatic lung cancer (metastasis from lung to other site) [facility])   Essential hypertension   Antineoplastic chemotherapy induced pancytopenia (HCC)   Cancer associated pain   Metastasis to bone (HCC)   Iron deficiency anemia   Metastasis to brain (HCC)   B12 deficiency anemia   Severe protein-calorie malnutrition ([place])   Sepsis  (HCC)   Lower GI bleed   Pressure injury of skin   Acute respiratory distress    Time spent: >35 minutes     Jenna Routzahn  [facility]  .  If 7PM-7AM, please contact night-coverage at [email or web address], password [facility] 09/04/2016, 9:30 AM  LOS: 7 days

## 2016-09-04 NOTE — Telephone Encounter (Signed)
"  I do not work at [facility]. It is up to the doctors there to decide whether it is appropriate to transfer her to [facility] or not With recent sepsis, we prefer to wait at least a week before scheduling a port"  Left message for [name] with note above. We will reschedule all appts once she is discharged

## 2016-09-05 ENCOUNTER — Ambulatory Visit: Payer: Medicaid Other | Admitting: Hematology and Oncology

## 2016-09-05 ENCOUNTER — Other Ambulatory Visit: Payer: Medicaid Other

## 2016-09-05 DIAGNOSIS — C3492 Malignant neoplasm of unspecified part of left bronchus or lung: Secondary | ICD-10-CM

## 2016-09-05 LAB — GLUCOSE, CAPILLARY
GLUCOSE-CAPILLARY: 73 mg/dL (ref 65–99)
GLUCOSE-CAPILLARY: 69 mg/dL (ref 65–99)
GLUCOSE-CAPILLARY: 82 mg/dL (ref 65–99)
GLUCOSE-CAPILLARY: 95 mg/dL (ref 65–99)
Glucose-Capillary: 103 mg/dL — ABNORMAL HIGH (ref 65–99)
Glucose-Capillary: 100 mg/dL — ABNORMAL HIGH (ref 65–99)

## 2016-09-05 LAB — CBC
HEMATOCRIT: 26 % — AB (ref 36.0–46.0)
HEMOGLOBIN: 8.4 g/dL — AB (ref 12.0–15.0)
MCH: 27.9 pg (ref 26.0–34.0)
MCHC: 32.3 g/dL (ref 30.0–36.0)
MCV: 86.4 fL (ref 78.0–100.0)
Platelets: 70 10*3/uL — ABNORMAL LOW (ref 150–400)
RBC: 3.01 MIL/uL — AB (ref 3.87–5.11)
RDW: 17.9 % — ABNORMAL HIGH (ref 11.5–15.5)
WBC: 15.7 10*3/uL — AB (ref 4.0–10.5)

## 2016-09-05 MED ORDER — METOPROLOL TARTRATE 25 MG PO TABS: 25.0000 mg | ORAL_TABLET | Freq: Two times a day (BID) | ORAL | 2 refills | Status: AC

## 2016-09-05 MED ORDER — MORPHINE SULFATE ER 15 MG PO TBCR: 15.0000 mg | EXTENDED_RELEASE_TABLET | Freq: Two times a day (BID) | ORAL | 0 refills | Status: AC

## 2016-09-05 MED ORDER — HYDROCODONE-ACETAMINOPHEN 5-325 MG PO TABS: 1.0000 | ORAL_TABLET | ORAL | 0 refills | Status: DC | PRN

## 2016-09-05 NOTE — Discharge Summary (Addendum)
Physician Discharge Summary  Heather Hinton KDX:[id number] DOB: [birth year] DOA: 08/28/2016  PCP: Neale Burly, MD  Admit date: 08/28/2016 Discharge date: 09/05/2016  Time spent: 45 minutes  Recommendations for Outpatient Follow-up:  -Will be discharged to SNF today. -To follow up with oncology as scheduled to resume chemotherapy plan.   Discharge Diagnoses:  Principal Problem:   SIRS (systemic inflammatory response syndrome) (HCC) Active Problems:   Metastatic lung cancer (metastasis from lung to other site) [facility])   Essential hypertension   Antineoplastic chemotherapy induced pancytopenia (HCC)   Cancer associated pain   Metastasis to bone (HCC)   Iron deficiency anemia   Metastasis to brain (HCC)   B12 deficiency anemia   Severe protein-calorie malnutrition (HCC)   Lower GI bleed   Pressure injury of skin   Acute respiratory distress   Discharge Condition: Stable and improved  Filed Weights   09/01/16 0400 09/02/16 0500 09/03/16 0500  Weight: 61.1 kg (134 lb 11.2 oz) 61.9 kg (136 lb 7.4 oz) 61.1 kg (134 lb 11.2 oz)    History of present illness:  As per Dr. Rockne Menghini on 7/17: Heather Hinton is an 56 y.o. female with a PMH of metastatic lung cancer diagnosed 07/2016 when she presented with weakness, left hip pain, fever and chills. CT imaging done 07/14/16 showed a right middle lobe mass lesion with associated hilar and mediastinal adenopathy and near complete right middle lobe collapse. She underwent soft tissue needle core biopsy to her left ischium on 07/16/16 which showed metastatic poorly differentiated carcinoma. Subsequent MRI of the brain showed 6 enhancing lesions with surrounding vasogenic edema. On 07/18/16 she received radiation treatment to her brain, chest and bone. A Port-A-Cath was placed on 08/20/16 and she began palliative chemotherapy with carboplatin and Alimta on 08/21/16. She follows with Dr. Alvy Bimler. Other medical history includes hypertension and spinal stenosis.  Presents today for chief complaint of 24 hour history of shortness of breath, worse with any activity. Denies any associated chest pain, cough, or fever. Nothing makes the problem better.  ED Course:  In the ED, she was significantly tachycardic and neutropenic and given her history of recent chemotherapy, has been referred for inpatient evaluation and treatment.  Hospital Course:   SIRS -In a neutropenic patient currently receiving chemotherapy. -Urinalysis and chest x-ray negative for signs of infection. -All culture data remains negative to date. -Has received broad-spectrum antibiotics with vancomycin and Zosyn for 7 days. Antibiotics have been discontinued. -SIRS criteria have resolved. -No further antibiotics required at this point.  Lower GI bleed -This is new from this admission, has now resolved. -She has received 3 units of PRBCs since admission. -No plans for active investigation given her ongoing acute systemic illness. -Hemoglobin has been stable in the mid 8 range.  Metastatic lung cancer -On chemotherapy with carboplatin and Alimta. -Patient's primary oncologist is aware of her admission.  Pancytopenia, chemotherapy-induced -She is status post 3 units of PRBCs, 2 units of platelets and also received Neupogen until [place] was greater than 1000. -Counts currently remains stable.  Pain associated with cancer -Stable at present, continue home narcotic regimen. -Patient is especially prominent in the left hip and x-rays are concerning for metastatic bone lesion. -If pain increases, may need to consider palliative radiation. Will let oncology decide.  Severe protein caloric malnutrition -Seen by dietitian. -Continue multivitamins.  Deconditioning -Due to acute illness and metastatic cancer with with metastases to the hip causing difficulty ambulating. -Plan is for SNF for rehabilitation.  Procedures:  None   Consultations:  None   Discharge  Instructions  Discharge Instructions    Diet - low sodium heart healthy    Complete by:  As directed    Increase activity slowly    Complete by:  As directed      Allergies as of 09/05/2016      Reactions   Iron Nausea And Vomiting      Medication List    STOP taking these medications   senna-docusate 8.6-50 MG tablet Commonly known as:  Senokot-S     TAKE these medications   dexamethasone 4 MG tablet Commonly known as:  DECADRON Take 1 tablet (4 mg total) by mouth every 8 (eight) hours.   dexamethasone 4 MG tablet Commonly known as:  DECADRON Take 1 tab two times a day the day before Alimta chemo. Take 2 tabs two times a day starting the day after chemo for 3 days.   HYDROcodone-acetaminophen 5-325 MG tablet Commonly known as:  NORCO/VICODIN Take 1 tablet by mouth every 4 (four) hours as needed for moderate pain or severe pain.   lidocaine-prilocaine cream Commonly known as:  EMLA Apply 1 application topically as needed.   lovastatin 20 MG tablet Commonly known as:  MEVACOR Take 20 mg by mouth at bedtime.   metoprolol tartrate 25 MG tablet Commonly known as:  LOPRESSOR Take 1 tablet (25 mg total) by mouth 2 (two) times daily.   morphine 15 MG 12 hr tablet Commonly known as:  MS CONTIN Take 1 tablet (15 mg total) by mouth every 12 (twelve) hours.   multivitamin with minerals Tabs tablet Take 1 tablet by mouth daily.   ondansetron 8 MG tablet Commonly known as:  ZOFRAN Take 1 tablet (8 mg total) by mouth 2 (two) times daily as needed for refractory nausea / vomiting. Start on day 3 after chemo.   pantoprazole 40 MG tablet Commonly known as:  PROTONIX Take 1 tablet (40 mg total) by mouth daily.   polyethylene glycol packet Commonly known as:  MIRALAX / GLYCOLAX Take 17 g by mouth daily.   prochlorperazine 10 MG tablet Commonly known as:  COMPAZINE Take 1 tablet (10 mg total) by mouth every 6 (six) hours as needed (Nausea or vomiting).             Durable Medical Equipment        Start     Ordered   09/04/16 0932  For home use only DME 3 n 1  Once     09/04/16 0931   09/04/16 0932  For home use only DME lightweight manual wheelchair with seat cushion  Once    Comments:  Patient suffers from *malignancy  which impairs their ability to perform daily activities like walking in the home.  A   cane will not resolve  issue with performing activities of daily living. A wheelchair will allow patient to safely perform daily activities. Patient is not able to propel themselves in the home using a standard weight wheelchair due to weakness Patient can self propel in the lightweight wheelchair.  Accessories: elevating leg rests (ELRs), wheel locks, extensions and anti-tippers.   09/04/16 0931     Allergies  Allergen Reactions  . Iron Nausea And Vomiting      The results of significant diagnostics from this hospitalization (including imaging, microbiology, ancillary and laboratory) are listed below for reference.    Significant Diagnostic Studies: Dg Chest 2 View  Result Date: 08/28/2016 CLINICAL DATA:  56 year old  female with increasing shortness of breath since yesterday. History of lung cancer on chemotherapy. EXAM: CHEST  2 VIEW COMPARISON:  Chest x-ray 08/20/2016. FINDINGS: Right internal jugular power porta cath with tip terminating in the mid superior vena cava. No acute consolidative airspace disease. No pleural effusions. No evidence of pulmonary edema. Heart size is normal. Prominence of the right paratracheal soft tissues and right hilar soft tissues, similar to prior examination. Lateral projection also demonstrates a prominent anterior mediastinal mass, similar to the prior study. IMPRESSION: 1. No radiographic evidence of acute cardiopulmonary disease. 2. Right hilar and mediastinal lymphadenopathy, similar to the prior examination. Electronically Signed   By: Vinnie Langton M.D.   On: 08/28/2016 12:56   Dg Chest 2  View  Result Date: 08/20/2016 CLINICAL DATA:  Initial evaluation for acute fever. EXAM: CHEST  2 VIEW COMPARISON:  Prior radiograph from 07/14/2016. FINDINGS: Interval placement of right-sided Port-A-Cath with tip overlying the distal SVC. Stable cardiac and mediastinal silhouettes with right hilar and mediastinal adenopathy. Lungs normally inflated. Right middle lobe mass grossly unchanged. No focal infiltrates. No pulmonary edema or pleural effusion. No pneumothorax. No acute osseus abnormality. IMPRESSION: 1. Interval placement of right-sided Port-A-Cath with tip overlying the distal SVC. 2. The No significant interval change in right middle lobe mass with associated hilar and mediastinal adenopathy. 3. No other active cardiopulmonary disease. Electronically Signed   By: Jeannine Boga M.D.   On: 08/20/2016 23:05   Ct Angio Chest Pe W And/or Wo Contrast  Result Date: 08/28/2016 CLINICAL DATA:  History of lung carcinoma with shortness of breath, initial encounter EXAM: CT ANGIOGRAPHY CHEST WITH CONTRAST TECHNIQUE: Multidetector CT imaging of the chest was performed using the standard protocol during bolus administration of intravenous contrast. Multiplanar CT image reconstructions and MIPs were obtained to evaluate the vascular anatomy. CONTRAST:  75 mL Isovue 370. COMPARISON:  07/14/2016 FINDINGS: Cardiovascular: Thoracic aorta is well visualize without significant atherosclerotic calcifications or aneurysmal dilatation. No dissection is identified. No significant cardiac enlargement is seen. No coronary calcifications are noted. The pulmonary artery demonstrates a normal branching pattern. No definitive filling defect is identified to suggest pulmonary embolism. Mediastinum/Nodes: Soft tissue density is noted in the right supraclavicular region which measures 3.4 by 2.5 cm. This was not as well evaluated on the prior CT examination is consistent with nodal metastatic disease. Right chest wall port is  now seen in satisfactory position. Considerable right paratracheal and pretracheal adenopathy is noted this demonstrates some decrease in size now measuring approximately 4 cm in AP dimension previously measuring 5.2 cm. Additionally subcarinal and right hilar adenopathy is noted. These are relatively stable from the prior exam. Anterior mediastinal adenopathy is also noted stable from the prior exam. The esophagus as visualized is within normal limits. Prominent lymph nodes are noted in the left axilla largest of these measures approximately 13 cm in short axis enlarged from previous exam. Subcutaneous lesion in the right axilla is noted measuring 16 mm best seen on image number 86 of series 5 which was not as well appreciated on the prior exam due to the imaging technique. Lungs/Pleura: Diffuse emphysematous changes are identified. The left lung is well aerated without focal infiltrate or sizable effusion. The previously seen soft tissue lesion along the medial aspect of the left lower lobe is again identified and appears enlarged now measuring 2.7 x 2.2 cm. It previously measured 1.8 x 1.0 cm. This is again felt to be arising from the chest wall and consistent with a  focal metastatic lesion. Right middle lobe mass lesion is again identified but appears slightly smaller now measuring 1.8 cm in greatest dimension. This is decreased from 2.8 cm on the prior exam. The degree of right middle lobe collapse has improved when compared with the prior exam. The bronchial tree is now patent extending into the right middle lobe. Upper Abdomen: Few scattered hyperdensities are noted within the liver likely representing hemangiomas. There stable from the prior exam. Enlargement of both adrenal glands is now seen. The right adrenal gland measures approximately 2.9 cm in greatest dimension. The left adrenal gland measures approximately 3.0 cm in greatest dimension. Given the findings in the chest this likely represents focal  metastatic disease and is new from the prior exam. Few scattered small rounded densities are noted within the omentum which may represent peritoneal metastatic disease. Irregularity of the upper pole of the right kidney remains. Musculoskeletal: Scattered lucencies are noted within the thoracic spine as well as within the ribcage consistent with metastatic disease. This is new from the prior exam bone scan may be helpful for further evaluation as clinically indicated. The most prominent of these lesions lies in the left half of the T9 vertebral body. Better visualized on the current examination is a 3.4 cm soft tissue lesion within the subcutaneous fat just to the right of the midline in the midthoracic spine along the paraspinal musculature. A few smaller subcutaneous lesions are noted in the posterior chest wall. Review of the MIP images confirms the above findings. IMPRESSION: No evidence of pulmonary embolism. Findings consistent with the patient's known history of lung carcinoma with metastatic disease. The overall size of the right middle lobe mass lesion has decreased in the interval from the prior exam. There has been some increase in metastatic disease particularly in the thoracic spine and axillary regions bilaterally as well as in the left paraspinal region and subcutaneous tissues in the posterior chest wall. Right supraclavicular adenopathy is noted which was not as well visualized on a prior exam due to the imaging technique. Some decrease is noted within the pretracheal and peritracheal adenopathy when compared with the prior exam. Emphysema (ICD10-J43.9). Electronically Signed   By: Inez Catalina M.D.   On: 08/28/2016 14:04   Ir US Guide Vasc Access Right  Result Date: 08/20/2016 CLINICAL DATA:  Metastatic lung cancer, access for chemotherapy. EXAM: RIGHT INTERNAL JUGULAR SINGLE LUMEN POWER PORT CATHETER INSERTION Date:  7/9/20187/10/2016 1:40 pm Radiologist:  M. Daryll Brod, MD Guidance:   Ultrasound and fluoroscopic MEDICATIONS: Ancef 2 g; The antibiotic was administered within an appropriate time interval prior to skin puncture. ANESTHESIA/SEDATION: Versed 3.0 mg IV; Fentanyl 100 mcg IV; Moderate Sedation Time:  28 minutes The patient was continuously monitored during the procedure by the interventional radiology nurse under my direct supervision. FLUOROSCOPY TIME:  36 seconds (4 mGy) COMPLICATIONS: None immediate. CONTRAST:  None. PROCEDURE: Informed consent was obtained from the patient following explanation of the procedure, risks, benefits and alternatives. The patient understands, agrees and consents for the procedure. All questions were addressed. A time out was performed. Maximal barrier sterile technique utilized including caps, mask, sterile gowns, sterile gloves, large sterile drape, hand hygiene, and 2% chlorhexidine scrub. Under sterile conditions and local anesthesia, right internal jugular micropuncture venous access was performed. Access was performed with ultrasound. Images were obtained for documentation. A guide wire was inserted followed by a transitional dilator. This allowed insertion of a guide wire and catheter into the IVC. Measurements were obtained from the  SVC / RA junction back to the right IJ venotomy site. In the right infraclavicular chest, a subcutaneous pocket was created over the second anterior rib. This was done under sterile conditions and local anesthesia. 1% lidocaine with epinephrine was utilized for this. A 2.5 cm incision was made in the skin. Blunt dissection was performed to create a subcutaneous pocket over the right pectoralis major muscle. The pocket was flushed with saline vigorously. There was adequate hemostasis. The port catheter was assembled and checked for leakage. The port catheter was secured in the pocket with two retention sutures. The tubing was tunneled subcutaneously to the right venotomy site and inserted into the SVC/RA junction through  a valved peel-away sheath. Position was confirmed with fluoroscopy. Images were obtained for documentation. The patient tolerated the procedure well. No immediate complications. Incisions were closed in a two layer fashion with 4 - 0 Vicryl suture. Dermabond was applied to the skin. The port catheter was accessed, blood was aspirated followed by saline and heparin flushes. Needle was removed. A dry sterile dressing was applied. IMPRESSION: Ultrasound and fluoroscopically guided right internal jugular single lumen power port catheter insertion. Tip in the SVC/RA junction. Catheter ready for use. Electronically Signed   By: Jerilynn Mages.  Shick M.D.   On: 08/20/2016 13:53   Dg Chest Port 1 View  Result Date: 09/01/2016 CLINICAL DATA:  Acute respiratory distress EXAM: PORTABLE CHEST 1 VIEW COMPARISON:  08/28/2016 FINDINGS: Right-sided central venous port tip overlies the SVC. Development of bilateral right greater than left ground-glass density throughout the lung fields. No large effusion. Stable cardiomediastinal silhouette with enlarged mediastinal contour corresponding to CT demonstrated mass/adenopathy. No pneumothorax. IMPRESSION: 1. Interim development of bilateral right greater than left ground-glass density which may be secondary to asymmetric edema, or diffuse infection. 2. Stable enlarged mediastinal silhouette corresponding to adenopathy demonstrated on CT Electronically Signed   By: Donavan Foil M.D.   On: 09/01/2016 21:25   Dg Hip Unilat With Pelvis Min 4 Views Left  Result Date: 09/03/2016 CLINICAL DATA:  Metastatic lung cancer weakness in the left hip EXAM: DG HIP (WITH OR WITHOUT PELVIS) 1V*L* COMPARISON:  05/03/2016 FINDINGS: SI joints are symmetric. No acute displaced fracture or malalignment. Expansile lucent lesion within the left inferior pubic ramus and extending to the acetabulum with bony destruction evident. IMPRESSION: 1. Large expansile lucent lesion within the left inferior pubic ramus and  likely extending to the inferior acetabulum concerning for metastatic bone lesion given history of lung cancer. There is no obvious fracture or dislocation. Electronically Signed   By: Donavan Foil M.D.   On: 09/03/2016 17:03   Ir Fluoro Guide Port Insertion Right  Result Date: 08/20/2016 CLINICAL DATA:  Metastatic lung cancer, access for chemotherapy. EXAM: RIGHT INTERNAL JUGULAR SINGLE LUMEN POWER PORT CATHETER INSERTION Date:  7/9/20187/10/2016 1:40 pm Radiologist:  M. Daryll Brod, MD Guidance:  Ultrasound and fluoroscopic MEDICATIONS: Ancef 2 g; The antibiotic was administered within an appropriate time interval prior to skin puncture. ANESTHESIA/SEDATION: Versed 3.0 mg IV; Fentanyl 100 mcg IV; Moderate Sedation Time:  28 minutes The patient was continuously monitored during the procedure by the interventional radiology nurse under my direct supervision. FLUOROSCOPY TIME:  36 seconds (4 mGy) COMPLICATIONS: None immediate. CONTRAST:  None. PROCEDURE: Informed consent was obtained from the patient following explanation of the procedure, risks, benefits and alternatives. The patient understands, agrees and consents for the procedure. All questions were addressed. A time out was performed. Maximal barrier sterile technique utilized including caps, mask,  sterile gowns, sterile gloves, large sterile drape, hand hygiene, and 2% chlorhexidine scrub. Under sterile conditions and local anesthesia, right internal jugular micropuncture venous access was performed. Access was performed with ultrasound. Images were obtained for documentation. A guide wire was inserted followed by a transitional dilator. This allowed insertion of a guide wire and catheter into the IVC. Measurements were obtained from the SVC / RA junction back to the right IJ venotomy site. In the right infraclavicular chest, a subcutaneous pocket was created over the second anterior rib. This was done under sterile conditions and local anesthesia. 1%  lidocaine with epinephrine was utilized for this. A 2.5 cm incision was made in the skin. Blunt dissection was performed to create a subcutaneous pocket over the right pectoralis major muscle. The pocket was flushed with saline vigorously. There was adequate hemostasis. The port catheter was assembled and checked for leakage. The port catheter was secured in the pocket with two retention sutures. The tubing was tunneled subcutaneously to the right venotomy site and inserted into the SVC/RA junction through a valved peel-away sheath. Position was confirmed with fluoroscopy. Images were obtained for documentation. The patient tolerated the procedure well. No immediate complications. Incisions were closed in a two layer fashion with 4 - 0 Vicryl suture. Dermabond was applied to the skin. The port catheter was accessed, blood was aspirated followed by saline and heparin flushes. Needle was removed. A dry sterile dressing was applied. IMPRESSION: Ultrasound and fluoroscopically guided right internal jugular single lumen power port catheter insertion. Tip in the SVC/RA junction. Catheter ready for use. Electronically Signed   By: Jerilynn Mages.  Shick M.D.   On: 08/20/2016 13:53    Microbiology: Recent Results (from the past 240 hour(s))  Blood Culture (routine x 2)     Status: None   Collection Time: 08/28/16 12:47 PM  Result Value Ref Range Status   Specimen Description LEFT ANTECUBITAL  Final   Special Requests   Final    BOTTLES DRAWN AEROBIC AND ANAEROBIC Blood Culture results may not be optimal due to an inadequate volume of blood received in culture bottles   Culture NO GROWTH 5 DAYS  Final   Report Status 09/02/2016 FINAL  Final  Blood Culture (routine x 2)     Status: None   Collection Time: 08/28/16  1:24 PM  Result Value Ref Range Status   Specimen Description BLOOD RIGHT HAND  Final   Special Requests   Final    BOTTLES DRAWN AEROBIC AND ANAEROBIC Blood Culture results may not be optimal due to an  inadequate volume of blood received in culture bottles   Culture NO GROWTH 5 DAYS  Final   Report Status 09/02/2016 FINAL  Final  MRSA PCR Screening     Status: None   Collection Time: 08/28/16  4:25 PM  Result Value Ref Range Status   MRSA by PCR NEGATIVE NEGATIVE Final    Comment:        The GeneXpert MRSA Assay (FDA approved for NASAL specimens only), is one component of a comprehensive MRSA colonization surveillance program. It is not intended to diagnose MRSA infection nor to guide or monitor treatment for MRSA infections.      Labs: Basic Metabolic Panel:  Recent Labs Lab 08/30/16 0626 09/01/16 0450 09/02/16 0449 09/04/16 0506  NA 133* 133* 136 135  K 3.5 3.2* 3.9 3.7  CL 99* 103 104 102  CO2 23 22 25 23   GLUCOSE 96 99 86 93  BUN 10 7 6  8  CREATININE 0.55 0.51 0.45 0.56  CALCIUM 9.0 8.3* 8.8* 8.9   Liver Function Tests:  Recent Labs Lab 09/04/16 0506  AST 26  ALT 25  ALKPHOS 162*  BILITOT 0.8  PROT 5.0*  ALBUMIN 1.5*   No results for input(s): LIPASE, AMYLASE in the last 168 hours. No results for input(s): AMMONIA in the last 168 hours. CBC:  Recent Labs Lab 08/30/16 0626 08/31/16 0524 09/01/16 0450 09/02/16 0449 09/03/16 0515 09/04/16 0506 09/05/16 0526  WBC 0.8* 1.1* 2.7* 6.3 10.2 13.4* 15.7*  NEUTROABS 0.6* 0.6* 2.1  --   --   --   --   HGB 7.9* 8.9* 7.9* 7.6* 7.8* 8.5* 8.4*  HCT 24.5* 26.4* 24.1* 23.5* 24.2* 25.5* 26.0*  MCV 84.5 83.8 84.0 84.8 85.5 85.9 86.4  PLT 35* 18* 14* 61* 56* 58* 70*   Cardiac Enzymes: No results for input(s): CKTOTAL, CKMB, CKMBINDEX, TROPONINI in the last 168 hours. BNP: BNP (last 3 results)  Recent Labs  08/28/16 1600  BNP 61.0    ProBNP (last 3 results) No results for input(s): PROBNP in the last 8760 hours.  CBG:  Recent Labs Lab 09/04/16 1602 09/04/16 2107 09/05/16 0717 09/05/16 0800 09/05/16 1110  GLUCAP 73 72 69 82 103*       Signed:  [facility] Pager: [phone] 09/05/2016, 2:35 PM

## 2016-09-05 NOTE — Progress Notes (Signed)
Physical Therapy Treatment Patient Details Name: Heather Hinton MRN: [id number] DOB: [birth year] Today's Date: 09/05/2016    History of Present Illness Heather Hinton is a 56yo black female who comes to [facility] on 7/19, admitted with SIRS. 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 Pt reports starting chemo abotu 2 weeks ago, and has since had a signiificant decline in mobility and strength. 6WA pt was a community amb without limitations. She currently lives with husband who works M-F 6-6 and 59yo granddaughter.     PT Comments    Session focus on strengthening and education on benefits with adherence to HEP.  Pt limited by extreme weakness with increased time for proper positioning and slow movements with all exercises.  AAROM required with SLR due to weakness and cueing for proper form with all exercises for maximal benefits.  No reports of pain through session, was limited by fatigue.    Follow Up Recommendations        Equipment Recommendations       Recommendations for Other Services       Precautions / Restrictions Precautions Precautions: Fall Precaution Comments: Left hip lesion Restrictions Weight Bearing Restrictions: No    Mobility  Bed Mobility Overal bed mobility: Needs Assistance Bed Mobility: Rolling Rolling: Min assist         General bed mobility comments: able to perform 80% of task with modI but requires heavy effort, and additional time., min A for sliding to head of bed   Transfers                    Ambulation/Gait                 Stairs            Wheelchair Mobility    Modified Rankin (Stroke Patients Only)       Balance                                            Cognition Arousal/Alertness: Awake/alert Behavior During Therapy: WFL for tasks assessed/performed Overall Cognitive Status: Within Functional Limits for tasks assessed                                         Exercises General Exercises - Lower Extremity Ankle Circles/Pumps: AROM;15 reps Heel Slides: AROM;Both;10 reps (knee flex/ext amd abd/add) Hip ABduction/ADduction: AROM;Both;Supine;10 reps Straight Leg Raises: AAROM;Both;5 reps Hip Flexion/Marching: AAROM;Both;10 reps Other Exercises Other Exercises: abdominal isometric contractions 10x 5" holds Other Exercises: Sidelying UE abduction 10x Other Exercises: clam 10x    General Comments        Pertinent Vitals/Pain Pain Assessment: No/denies pain    Home Living                      Prior Function            PT Goals (current goals can now be found in the care plan section) Acute Rehab PT Goals Patient Stated Goal: regain strength for independent transfers  Progress towards PT goals: Progressing toward goals    Frequency           PT Plan      Co-evaluation  AM-PAC PT "6 Clicks" Daily Activity  Outcome Measure  Difficulty turning over in bed (including adjusting bedclothes, sheets and blankets)?: A Lot Difficulty moving from lying on back to sitting on the side of the bed? : Total Difficulty sitting down on and standing up from a chair with arms (e.g., wheelchair, bedside commode, etc,.)?: Total Help needed moving to and from a bed to chair (including a wheelchair)?: Total Help needed walking in hospital room?: Total Help needed climbing 3-5 steps with a railing? : Total 6 Click Score: 7    End of Session Equipment Utilized During Treatment: Gait belt Activity Tolerance: Patient limited by fatigue;No increased pain Patient left: in bed;with call bell/phone within reach Nurse Communication: Mobility status PT Visit Diagnosis: Unsteadiness on feet (R26.81);Muscle weakness (generalized) (M62.81);Difficulty in walking, not elsewhere classified (R26.2)     Time: 2820-6015 PT Time Calculation (min) (ACUTE ONLY): 50 min  Charges:  $Therapeutic Exercise:  38-52 mins                    G Codes:       Ihor Austin, LPTA; CBIS [phone]  Aldona Lento 09/05/2016, 6:04 PM

## 2016-09-05 NOTE — Progress Notes (Signed)
Physical Therapy Treatment Patient Details Name: Heather Hinton MRN: [id number] DOB: 1960-11-29 Today's Date: 09/05/2016    History of Present Illness Heather Hinton is a 56yo black female who comes to [facility] on 7/19, admitted with SIRS. 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 Pt reports starting chemo abotu 2 weeks ago, and has since had a signiificant decline in mobility and strength. 6WA pt was a community amb without limitations. She currently lives with husband who works M-F 6-6 and 45yo granddaughter.     PT Comments    Pt semirecumbent in bed and willing to particiate.  Upon entrance noted accident in bed, RN informed to clean/linen change.  Pt required cueing and min assistance for bed mobilty to sit on EOB, cueing for hand placement and trunk rotation to increase ease with supine to sitting.  Max A with sit to stand and stand pivot transfer to chair.  Pt left in chair with call bell within reach.  EOS pt was limited by fatigue with task, no reports of increased pain.  Due to fatigue and time restraints unable to review HEP, need to review next session.    Follow Up Recommendations        Equipment Recommendations       Recommendations for Other Services       Precautions / Restrictions Precautions Precautions: Fall Precaution Comments: Left hip lesion Restrictions Weight Bearing Restrictions: No    Mobility  Bed Mobility Overal bed mobility: Needs Assistance Bed Mobility: Supine to Sit     Supine to sit: Min assist     General bed mobility comments: able to perform 80% of task with modI but requires heavy effort, and additional time.   Transfers Overall transfer level: Needs assistance Equipment used: None Transfers: Sit to/from [facility] Sit to Stand: Max assist Stand pivot transfers: Max assist;From elevated surface       General transfer comment: requires physical assist for trunk support and  weight shifting; max A for standing while nurse cleans  Ambulation/Gait                 Stairs            Wheelchair Mobility    Modified Rankin (Stroke Patients Only)       Balance                                            Cognition Arousal/Alertness: Awake/alert Behavior During Therapy: WFL for tasks assessed/performed Overall Cognitive Status: Within Functional Limits for tasks assessed                                        Exercises      General Comments        Pertinent Vitals/Pain Pain Assessment: No/denies pain (did c/o hemorrhoids pain, no pain scale given)    Home Living                      Prior Function            PT Goals (current goals can now be found in the care plan section) Acute Rehab PT Goals Patient Stated Goal: regain strength for independent transfers  PT Goal Formulation: With patient  Time For Goal Achievement: 09/17/16 Progress towards PT goals: Progressing toward goals    Frequency    Min 2X/week      PT Plan      Co-evaluation              AM-PAC PT "6 Clicks" Daily Activity  Outcome Measure  Difficulty turning over in bed (including adjusting bedclothes, sheets and blankets)?: A Lot Difficulty moving from lying on back to sitting on the side of the bed? : Total Difficulty sitting down on and standing up from a chair with arms (e.g., wheelchair, bedside commode, etc,.)?: Total Help needed moving to and from a bed to chair (including a wheelchair)?: Total Help needed walking in hospital room?: Total Help needed climbing 3-5 steps with a railing? : Total 6 Click Score: 7    End of Session Equipment Utilized During Treatment: Gait belt Activity Tolerance: Patient limited by fatigue (tachy 126 bpm) Patient left: in chair;with call bell/phone within reach;with nursing/sitter in room Nurse Communication: Mobility status PT Visit Diagnosis: Unsteadiness on  feet (R26.81);Muscle weakness (generalized) (M62.81);Difficulty in walking, not elsewhere classified (R26.2)     Time: 3154-0086 PT Time Calculation (min) (ACUTE ONLY): 31 min  Charges:  $Therapeutic Activity: 23-37 mins                    G Codes:       Ihor Austin, LPTA; CBIS [phone]  Aldona Lento 09/05/2016, 12:31 PM

## 2016-09-05 NOTE — NC FL2 (Signed)
[place] LEVEL OF CARE SCREENING TOOL     IDENTIFICATION  Patient Name: Heather Hinton Birthdate: October 26, 1960 Sex: female Admission Date (Current Location): 08/28/2016  [facility] and [place] Number:  Herbalist and Address:  [facility] [address] [place]      Provider Number: [id number]  Attending Physician Name and Address:  Isaac Bliss, [place]  Relative Name and Phone Number:       Current Level of Care: Hospital Recommended Level of Care: [place] Prior Approval Number:    Date Approved/Denied:   PASRR Number: [id number]   Discharge Plan: SNF    Current Diagnoses: Patient Active Problem List   Diagnosis Date Noted  . Acute respiratory distress   . Pressure injury of skin 09/01/2016  . Lower GI bleed 08/29/2016  . SIRS (systemic inflammatory response syndrome) ([place]) 08/28/2016  . Severe protein-calorie malnutrition ([place]) 08/23/2016  . Goals of care, counseling/discussion 07/31/2016  . B12 deficiency anemia 07/30/2016  . Iron deficiency anemia   . Metastasis to brain ([place])   . 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 Metastasis to bone ([place])   . Left hip pain   . Palliative care by specialist   . Metastatic lung cancer (metastasis from lung to other site) ([place]) 07/14/2016  . Essential hypertension 07/14/2016  . Hypokalemia 07/14/2016  . Hyperglycemia 07/14/2016  . Antineoplastic chemotherapy induced pancytopenia ([place]) 07/14/2016    Orientation RESPIRATION BLADDER Height & Weight     Self, Time, Situation, Place  Normal Incontinent Weight: 134 lb 11.2 oz (61.1 kg) Height:  5\' 4"  (162.6 cm)  BEHAVIORAL SYMPTOMS/MOOD NEUROLOGICAL BOWEL NUTRITION STATUS      Incontinent  (Please see d/c summary)  AMBULATORY STATUS COMMUNICATION OF NEEDS Skin   Extensive Assist Verbally PU Stage and Appropriate Care   PU Stage 2 Dressing: TID (Mid sacrum, foam dressing)                   Personal  Care Assistance Level of Assistance  Bathing, Feeding, Dressing Bathing Assistance: Maximum assistance Feeding assistance: Limited assistance Dressing Assistance: Maximum assistance     Functional Limitations Info  Sight, Hearing, Speech Sight Info: Adequate Hearing Info: Adequate Speech Info: Adequate    SPECIAL CARE FACTORS FREQUENCY  OT (By licensed OT), PT (By licensed PT)     PT Frequency: 2x week OT Frequency: 2x week            Contractures Contractures Info: Not present    Additional Factors Info  Code Status, Allergies, Isolation Precautions Code Status Info: Full Code Allergies Info: Iron     Isolation Precautions Info: Protective      Current Medications (09/05/2016):  This is the current hospital active medication list Current Facility-Administered Medications  Medication Dose Route Frequency Provider Last Rate Last Dose  . acetaminophen (TYLENOL) tablet 650 mg  650 mg Oral Q6H PRN Rama, Venetia Maxon, MD   650 mg at 09/04/16 0800   Or  . acetaminophen (TYLENOL) suppository 650 mg  650 mg Rectal Q6H PRN Rama, Venetia Maxon, MD      . docusate sodium (COLACE) capsule 100 mg  100 mg Oral BID Rise Patience, MD   100 mg at 09/05/16 1024  . guaiFENesin-dextromethorphan (ROBITUSSIN DM) 100-10 MG/5ML syrup 5 mL  5 mL Oral Q4H PRN Reyne Dumas, MD   5 mL at 09/04/16 1235  . HYDROcodone-acetaminophen (NORCO/VICODIN) 5-325 MG per tablet 1 tablet  1 tablet Oral  Q4H PRN Rama, Venetia Maxon, MD   1 tablet at 09/04/16 1235  . hydrocortisone (ANUSOL-HC) 2.5 % rectal cream   Rectal PRN Rama, Venetia Maxon, MD      . insulin aspart (novoLOG) injection 0-9 Units  0-9 Units Subcutaneous TID WC Rama, Venetia Maxon, MD   1 Units at 09/04/16 1230  . ipratropium (ATROVENT) nebulizer solution 0.5 mg  0.5 mg Nebulization Q6H PRN Buriev, Arie Sabina, MD      . levalbuterol (XOPENEX) nebulizer solution 0.63 mg  0.63 mg Nebulization Q6H PRN Buriev, Arie Sabina, MD      . metoprolol tartrate  (LOPRESSOR) tablet 25 mg  25 mg Oral BID Kinnie Feil, MD   25 mg at 09/05/16 1024  . morphine (MS CONTIN) 12 hr tablet 15 mg  15 mg Oral Q12H Rama, Venetia Maxon, MD   15 mg at 09/05/16 1024  . multivitamin with minerals tablet 1 tablet  1 tablet Oral Daily Rama, Venetia Maxon, MD   1 tablet at 09/05/16 1024  . ondansetron (ZOFRAN) tablet 4 mg  4 mg Oral Q6H PRN Rama, Venetia Maxon, MD       Or  . ondansetron (ZOFRAN) injection 4 mg  4 mg Intravenous Q6H PRN Rama, Christina P, MD      . pantoprazole (PROTONIX) EC tablet 40 mg  40 mg Oral Daily Rama, Venetia Maxon, MD   40 mg at 09/05/16 1024  . polyethylene glycol (MIRALAX / GLYCOLAX) packet 17 g  17 g Oral Daily Rama, Venetia Maxon, MD   17 g at 09/05/16 1024  . pravastatin (PRAVACHOL) tablet 20 mg  20 mg Oral q1800 Rama, Venetia Maxon, MD   20 mg at 09/04/16 1740  . sodium chloride flush (NS) 0.9 % injection 3 mL  3 mL Intravenous Q12H Rama, Venetia Maxon, MD   3 mL at 09/04/16 2114     Discharge Medications: Please see discharge summary for a list of discharge medications.  Relevant Imaging Results:  Relevant Lab Results:   Additional Information SSN: [id number]  Eileen Stanford, LCSW

## 2016-09-06 ENCOUNTER — Encounter (HOSPITAL_COMMUNITY): Payer: Self-pay | Admitting: Primary Care

## 2016-09-06 DIAGNOSIS — Z515 Encounter for palliative care: Secondary | ICD-10-CM

## 2016-09-06 DIAGNOSIS — Z7189 Other specified counseling: Secondary | ICD-10-CM

## 2016-09-06 LAB — GLUCOSE, CAPILLARY
Glucose-Capillary: 81 mg/dL (ref 65–99)
Glucose-Capillary: 71 mg/dL (ref 65–99)

## 2016-09-06 NOTE — Progress Notes (Signed)
Orders received for discharge to SNF. Instructions and prescriptions sent to Jenny Reichmann, RN at [facility]. Report called to same RN. All questions answered. Discussed Plan with pt; all questions answered. IV removed by patient with no complications. EMS to transport pt to facility. All belongings sent with husband Rufus.

## 2016-09-06 NOTE — Clinical Social Work Note (Signed)
Patient has MAF medicaid which will not pay for SNF. Patient needs to have her medicaid switched to long term care Medicaid. Patient received her disability approval letter on yesterday and will receive approximately $691 per month. LCSW communicated this information to patient, her sister, Heather Hinton; and her friends Heather Hinton.   LCSW spoke with Heather Hinton at [facility] regarding patient's need to switch to long term care Medicaid. Heather Hinton advised that she would check in to this and recontact LCSW.  LCSW spoke with Nathaniel Man, Surveyor, quantity of CSW, who agreed to do a 30 day LOG for room and board.  LCSW contacted [facility] to identify if they would accept LOG.  Lynnae Sandhoff indicated that she would check in to see about this being possible.      Clearnce Leja, Clydene Pugh, LCSW

## 2016-09-06 NOTE — Care Management Note (Signed)
Case Management Note  Patient Details  Name: Shanaiya Bene MRN: [id number] Date of Birth: 07-09-1960   If discussed at Long Length of Stay Meetings, dates discussed:  09/06/2016 Additional Comments:  Candela Krul, Chauncey Reading, RN 09/06/2016, 10:30 AM

## 2016-09-06 NOTE — Clinical Social Work Placement (Signed)
   CLINICAL SOCIAL WORK PLACEMENT  NOTE  Date:  09/06/2016  Patient Details  Name: Heather Hinton MRN: [id number] Date of Birth: [birth year]  Clinical Social Work is seeking post-discharge placement for this patient at the [place] level of care (*CSW will initial, date and re-position this form in  chart as items are completed):  Yes   Patient/family provided with [facility] Work Department's list of facilities offering this level of care within the geographic area requested by the patient (or if unable, by the patient's family).  Yes   Patient/family informed of their freedom to choose among providers that offer the needed level of care, that participate in Medicare, Medicaid or managed care program needed by the patient, have an available bed and are willing to accept the patient.  Yes   Patient/family informed of [facility]'s ownership interest in [facility] and [facility], as well as of the fact that they are under no obligation to receive care at these facilities.  PASRR submitted to EDS on 09/05/16     PASRR number received on 09/05/16     Existing PASRR number confirmed on       FL2 transmitted to all facilities in geographic area requested by pt/family on       FL2 transmitted to all facilities within larger geographic area on 09/06/16     Patient informed that his/her managed care company has contracts with or will negotiate with certain facilities, including the following:            Patient/family informed of bed offers received.  Patient chooses bed at       Physician recommends and patient chooses bed at      Patient to be transferred to   on  .  Patient to be transferred to facility by       Patient family notified on   of transfer.  Name of family member notified:        PHYSICIAN       Additional Comment:    _______________________________________________ Ihor Gully, LCSW 09/06/2016, 1:15 PM

## 2016-09-06 NOTE — Progress Notes (Signed)
Patient briefly seen and examined. Patient was discharged to SNF on 7/25, however ended up staying an extra day due to lack of bed availability. Discharge summary remains accurate and no further changes are to be made.  Domingo Mend, MD [facility] Pager: [phone]

## 2016-09-06 NOTE — Consult Note (Signed)
Consultation Note Date: 09/06/2016   Patient Name: Heather Hinton  DOB: 03-03-1960  MRN: [id number]  Age / Sex: 56 y.o., female  PCP: Neale Burly, MD Referring Physician: Isaac Bliss, Olam Idler*  Reason for Consultation: Establishing goals of care and Psychosocial/spiritual support  HPI/Patient Profile: 56 y.o. female  with past medical history of Hypertension and high cholesterol, B12 deficiency, iron deficiency anemia, lung cancer with metastatic burden to bone and brain, severe protein calorie malnutrition admitted on 08/28/2016 with SRS, metastatic lung cancer with burden to bone and brain.   Clinical Assessment and Goals of Care: [name] resting quietly in bed. She greets me making but not keeping eye contact. Present today at bedside is her friend [place]. [name] gives me the okay to share all helpful information in front of [place]. Mrs. Kannan and I talk about healthcare power of attorney in detail. She shares that she has 9 children that she bore. We talk about cancer treatment options including seeking further treatment at [facility] if that is closer for her. We share that she will go to [facility] [place] for rehab, I encourage her and Tye [place] to lean on the social worker there on-site. We talk about advanced directives, living will.  I share the realities of CPR and intubation, I do not press [name] for any choices at this time, but share that even if she were to survive CPR, it wouldn't change her cancer. I talk with friend Tye [place] outside the room about prognosis, with permission. I share that it would not surprise me if [name] had 3 to 6 months or less. Tye [place] states that she has seen a decline in her friend and she would not be surprised by this either. We talk about not burdening [name] with this discussion.  Request call from husband Caitriona Sundquist. I return call.   Mr. Kreiser and I speak at  length about [name] disease process, prognosis, and treatment plan. Mr. Stannard states that some of the children do not understand where she is with her cancer diagnosis. I share that I'm worried because of her declining functional status, her weight loss 40 pounds in 4 months, and the severity of her cancer. I lose his cell phone signal, as we're discussing a family meeting for next week. PMT on call made aware, offer for family meeting next week before 2 PM.   Healthcare power of attorney NEXT OF KIN - we discuss healthcare power of attorney.   SUMMARY OF RECOMMENDATIONS   at this point, continue chemotherapy if offered, SNF for rehab.  Code Status/Advance Care Planning:  Full code - we discussed the concept of treat the treatable but allow a natural death. We talk about the realities of CPR and intubation.  Symptom Management:   per hospitalist, no additional needs at this time.  Palliative Prophylaxis:   Frequent Pain Assessment and Turn Reposition  Additional Recommendations (Limitations, Scope, Preferences):  Full Scope Treatment  Psycho-social/Spiritual:   Desire for further Chaplaincy support:no  Additional Recommendations: Caregiving  Support/Resources and Education on Hospice  Prognosis:   < 3 months, would not be surprising based on advancing cancer, frailty, poor tolerance of 1st round of chemotherapy.  Discharge Planning: [facility] for [facility]hab, possibly residential      Primary Diagnoses: Present on Admission: . Severe protein-calorie malnutrition ([place]) . Metastatic lung cancer (metastasis from lung to other site) [facility]) . Antineoplastic chemotherapy induced pancytopenia ([place]) . Metastasis to brain ([place]) . Metastasis to bone ([place]) . Iron deficiency anemia . Essential hypertension . Cancer associated pain . B12 deficiency anemia . SIRS (systemic inflammatory response syndrome) (H[facility])   I have reviewed the medical record, interviewed the  patient and family, and examined the patient. The following aspects are pertinent.  Past Medical History:  Diagnosis Date  . B12 deficiency anemia 07/30/2016  . History of radiation therapy 07/17/16 - 07/30/16 (Right Lung and Left Pelvis), 08/03/16 ([facility]  . Hyperlipidemia   . Hypertension   . Iron deficiency anemia   . Lung cancer ([place])   . Metastasis to bone ([place])   . Metastasis to brain ([place])   . Severe protein-calorie malnutrition ([place]) 08/23/2016  . Spinal stenosis    Social History   Social History  . Marital status: Legally Separated    Spouse name: N/A  . Number of children: 71  . Years of education: N/A   Occupational History  . Unemployed    Social History Main Topics  . Smoking status: Former Smoker    Packs/day: 0.50    Years: 40.00    Types: Cigarettes    Quit date: 10/2015  . Smokeless tobacco: Never Used  . Alcohol use No  . Drug use: No  . Sexual activity: Yes   Other Topics Concern  . None   Social History Narrative   Lives with husband.   Family History  Problem Relation Age of Onset  . Hypertension Mother   . Diabetes Mother   . Breast cancer Mother   . Hypertension Father   . Hypertension Sister   . Pancreatic cancer Sister   . Lung cancer Maternal Grandmother    Scheduled Meds: . docusate sodium  100 mg Oral BID  . insulin aspart  0-9 Units Subcutaneous TID WC  . metoprolol tartrate  25 mg Oral BID  . morphine  15 mg Oral Q12H  . multivitamin with minerals  1 tablet Oral Daily  . pantoprazole  40 mg Oral Daily  . polyethylene glycol  17 g Oral Daily  . pravastatin  20 mg Oral q1800  . sodium chloride flush  3 mL Intravenous Q12H   Continuous Infusions: PRN Meds:.acetaminophen **OR** acetaminophen, guaiFENesin-dextromethorphan, HYDROcodone-acetaminophen, hydrocortisone, ipratropium, levalbuterol, ondansetron **OR** ondansetron (ZOFRAN) IV Medications Prior to Admission:  Prior to Admission medications   Medication Sig Start Date End  Date Taking? Authorizing Provider  dexamethasone (DECADRON) 4 MG tablet Take 1 tablet (4 mg total) by mouth every 8 (eight) hours. 07/23/16  Yes Theodis Blaze, MD  dexamethasone (DECADRON) 4 MG tablet Take 1 tab two times a day the day before [place] chemo. Take 2 tabs two times a day starting the day after chemo for 3 days. 08/21/16  Yes Gorsuch, Ni, MD  lidocaine-prilocaine (EMLA) cream Apply 1 application topically as needed. 07/30/16  Yes Gorsuch, Ni, MD  lovastatin (MEVACOR) 20 MG tablet Take 20 mg by mouth at bedtime.   Yes [provider]  Multiple Vitamin (MULTIVITAMIN WITH MINERALS) TABS tablet Take 1 tablet by mouth daily.  Yes [provider]  ondansetron (ZOFRAN) 8 MG tablet Take 1 tablet (8 mg total) by mouth 2 (two) times daily as needed for refractory nausea / vomiting. Start on day 3 after chemo. 08/21/16  Yes Gorsuch, Ni, MD  pantoprazole (PROTONIX) 40 MG tablet Take 1 tablet (40 mg total) by mouth daily. 08/17/16  Yes Gorsuch, Ni, MD  polyethylene glycol (MIRALAX / GLYCOLAX) packet Take 17 g by mouth daily. 07/24/16  Yes Theodis Blaze, MD  prochlorperazine (COMPAZINE) 10 MG tablet Take 1 tablet (10 mg total) by mouth every 6 (six) hours as needed (Nausea or vomiting). 08/21/16  Yes Heath Lark, MD  HYDROcodone-acetaminophen (NORCO/VICODIN) 5-325 MG tablet Take 1 tablet by mouth every 4 (four) hours as needed for moderate pain or severe pain. 09/05/16   Isaac Bliss, Rayford Halsted, MD  metoprolol tartrate (LOPRESSOR) 25 MG tablet Take 1 tablet (25 mg total) by mouth 2 (two) times daily. 09/05/16   Isaac Bliss, Rayford Halsted, MD  morphine (MS CONTIN) 15 MG 12 hr tablet Take 1 tablet (15 mg total) by mouth every 12 (twelve) hours. 09/05/16   Isaac Bliss, Rayford Halsted, MD  senna-docusate (SENOKOT-S) 8.6-50 MG tablet Take 2 tablets by mouth 2 (two) times daily. Patient not taking: Reported on 08/28/2016 07/23/16   Theodis Blaze, MD   Allergies  Allergen Reactions  . Iron Nausea  And Vomiting   Review of Systems  Unable to perform ROS: Acuity of condition    Physical Exam  Constitutional:  Appears frail, chronically ill  HENT:  Head: Atraumatic.  Cardiovascular: Normal rate and regular rhythm.   Rate 120's at times  Pulmonary/Chest: Effort normal. No respiratory distress.  Abdominal: Soft. She exhibits no distension.  Musculoskeletal: She exhibits no edema.  Neurological: She is alert.  Skin: Skin is warm and dry.  Nursing note and vitals reviewed.   Vital Signs: BP 114/68   Pulse (!) 121   Temp 98.6 F (37 C) (Oral)   Resp (!) 27   Ht 5\' 4"  (1.626 m)   Wt 61.7 kg (136 lb 0.4 oz)   SpO2 100%   BMI 23.35 kg/m  Pain Assessment: 0-10 POSS *See Group Information*: 1-Acceptable,Awake and alert Pain Score: Asleep   SpO2: SpO2: 100 % O2 Device:SpO2: 100 % O2 Flow Rate: .O2 Flow Rate (L/min): 2 L/min  IO: Intake/output summary: No intake or output data in the 24 hours ending 09/06/16 1601  LBM: Last BM Date: 09/05/16 Baseline Weight: Weight: 59 kg (130 lb) Most recent weight: Weight: 61.7 kg (136 lb 0.4 oz)     Palliative Assessment/Data:   Flowsheet Rows     Most Recent Value  Intake Tab  Referral Department  Hospitalist  Unit at Time of Referral  ICU  Palliative Care Primary Diagnosis  Cancer  Date Notified  09/06/16  Palliative Care Type  New Palliative care  Reason for referral  Clarify Goals of Care, Advance Care Planning, Psychosocial or Spiritual support  Date of Admission  08/28/16  Date first seen by Palliative Care  09/06/16  # of days Palliative referral response time  0 Day(s)  # of days IP prior to Palliative referral  9  Clinical Assessment  Palliative Performance Scale Score  40%  Pain Max last 24 hours  Not able to report  Pain Min Last 24 hours  Not able to report  Dyspnea Max Last 24 Hours  Not able to report  Dyspnea Min Last 24 hours  Not able to  report  Psychosocial & Spiritual Assessment  Palliative Care  Outcomes  Patient/Family meeting held?  Yes  Who was at the meeting?  Patient at bedside and friend [facility]  Palliative Care Outcomes  Provided advance care planning, Provided psychosocial or spiritual support, Clarified goals of care      Time In: 9924 Time Out: 1515 Time Total: 30 minutes Greater than 50%  of this time was spent counseling and coordinating care related to the above assessment and plan.  Signed by: Drue Novel, NP   Please contact Palliative Medicine Team phone at [phone] for questions and concerns.  For individual provider: See Shea Evans

## 2016-09-06 NOTE — Clinical Social Work Note (Signed)
Late Entry for 09/05/16  Clinical Social Work Assessment  Patient Details  Name: Heather Hinton MRN: [id number] Date of Birth: [birth year]  Date of referral:  09/06/16               Reason for consult:  Discharge Planning                Permission sought to share information with:    Permission granted to share information::     Name::        Agency::     Relationship::     Contact Information:     Housing/Transportation Living arrangements for the past 2 months:  Single Family Home Source of Information:  Patient Patient Interpreter Needed:  None Criminal Activity/Legal Involvement Pertinent to Current Situation/Hospitalization:  No - Comment as needed Significant Relationships:  Adult Children, Spouse, Siblings Lives with:  Spouse Do you feel safe going back to the place where you live?  No Need for family participation in patient care:  Yes (Comment)  Care giving concerns:  Patient stated that self care has become more difficult. She is home alone daily from 5:00a.m. To 5:00p.m. Daily.  Her husband assists her once he gets off of work.    Social Worker assessment / plan:  Patient want to go to a facility because she has no room/space to "get around and make myself stronger." She also reports that she needs additional support with ADLs.  Patient stated that she will continue with chemo once she is medically strong enough to do so.  She stated that she will go to any facility that can accept her. LCSW discussed limited options due to medicaid and chemo.    Employment status:  Disabled (Comment on whether or not currently receiving Disability) (Received letter of disability approval on 08/05/16.) Insurance information:  Medicaid In [place] PT Recommendations:  [place] / Referral to community resources:  [place]  Patient/Family's Response to care:  Patient is agreeable to self care.   Patient/Family's Understanding of and Emotional  Response to Diagnosis, Current Treatment, and Prognosis:  Patient understands her diagnosis, treatment and prognosis.   Emotional Assessment Appearance:  Appears stated age Attitude/Demeanor/Rapport:    Affect (typically observed):  Accepting Orientation:  Oriented to Situation, Oriented to  Time, Oriented to Place, Oriented to Self Alcohol / Substance use:    Psych involvement (Current and /or in the community):  No (Comment)  Discharge Needs  Concerns to be addressed:  Discharge Planning Concerns Readmission within the last 30 days:  No Current discharge risk:  None Barriers to Discharge:  No Barriers Identified   Ihor Gully, LCSW 09/06/2016, 1:07 PM

## 2016-09-06 NOTE — Clinical Social Work Placement (Signed)
   CLINICAL SOCIAL WORK PLACEMENT  NOTE  Date:  09/06/2016  Patient Details  Name: Heather Hinton MRN: [id number] Date of Birth: [birth year]  Clinical Social Work is seeking post-discharge placement for this patient at the [place] level of care (*CSW will initial, date and re-position this form in  chart as items are completed):  Yes   Patient/family provided with [facility] Work Department's list of facilities offering this level of care within the geographic area requested by the patient (or if unable, by the patient's family).  Yes   Patient/family informed of their freedom to choose among providers that offer the needed level of care, that participate in Medicare, Medicaid or managed care program needed by the patient, have an available bed and are willing to accept the patient.  Yes   Patient/family informed of [facility]'s ownership interest in [facility] and [facility], as well as of the fact that they are under no obligation to receive care at these facilities.  PASRR submitted to EDS on 09/05/16     PASRR number received on 09/05/16     Existing PASRR number confirmed on       FL2 transmitted to all facilities in geographic area requested by pt/family on       FL2 transmitted to all facilities within larger geographic area on 09/06/16     Patient informed that his/her managed care company has contracts with or will negotiate with certain facilities, including the following:            Patient/family informed of bed offers received.  Patient chooses bed [facility]     Physician recommends and patient chooses bed at      Patient to be transferred to [facility] on 09/06/16.  Patient to be transferred to facility by [facility]     Patient family notified on 09/06/16 of transfer.  Name of family member notified:  Santiago Glad, sister, listed on chart.     PHYSICIAN       Additional Comment: 30 LOG for  room and board. Clinicals sent to facility. Transport arranged. LCSW signing off.     _______________________________________________ Ihor Gully, LCSW 09/06/2016, 2:18 PM

## 2016-09-06 NOTE — Clinical Social Work Note (Signed)
DSS Heather Hinton) is saying that patient needs to be switched to Adult Medicaid and that patient will have to come in an apply).     Heather Hinton, Heather Pugh, LCSW

## 2016-09-07 ENCOUNTER — Telehealth: Payer: Self-pay | Admitting: *Deleted

## 2016-09-07 ENCOUNTER — Ambulatory Visit: Admission: RE | Admit: 2016-09-07 | Payer: Medicaid Other | Source: Ambulatory Visit | Admitting: Urology

## 2016-09-07 HISTORY — DX: Personal history of irradiation: Z92.3

## 2016-09-07 NOTE — Telephone Encounter (Signed)
CALLED PATIENT TO ASK ABOUT RESCHEDULING FU VISIT FOR 09-07-16 , LVM FOR A RETURN CALL

## 2016-09-10 ENCOUNTER — Encounter (HOSPITAL_COMMUNITY): Payer: Self-pay

## 2016-09-10 ENCOUNTER — Observation Stay (HOSPITAL_COMMUNITY)
Admission: EM | Admit: 2016-09-10 | Discharge: 2016-09-11 | Disposition: A | Discharge status: Home / Self Care | Payer: Medicaid Other | Attending: Internal Medicine | Admitting: Internal Medicine

## 2016-09-10 ENCOUNTER — Emergency Department (HOSPITAL_COMMUNITY): Payer: Medicaid Other

## 2016-09-10 DIAGNOSIS — D61810 Antineoplastic chemotherapy induced pancytopenia: Secondary | ICD-10-CM | POA: Diagnosis not present

## 2016-09-10 DIAGNOSIS — M25552 Pain in left hip: Secondary | ICD-10-CM | POA: Diagnosis not present

## 2016-09-10 DIAGNOSIS — C3490 Malignant neoplasm of unspecified part of unspecified bronchus or lung: Secondary | ICD-10-CM | POA: Diagnosis present

## 2016-09-10 DIAGNOSIS — E876 Hypokalemia: Secondary | ICD-10-CM | POA: Diagnosis not present

## 2016-09-10 DIAGNOSIS — C7800 Secondary malignant neoplasm of unspecified lung: Secondary | ICD-10-CM | POA: Diagnosis not present

## 2016-09-10 DIAGNOSIS — M79605 Pain in left leg: Secondary | ICD-10-CM | POA: Diagnosis present

## 2016-09-10 DIAGNOSIS — M79606 Pain in leg, unspecified: Secondary | ICD-10-CM | POA: Diagnosis present

## 2016-09-10 DIAGNOSIS — D519 Vitamin B12 deficiency anemia, unspecified: Secondary | ICD-10-CM | POA: Diagnosis not present

## 2016-09-10 DIAGNOSIS — E46 Unspecified protein-calorie malnutrition: Secondary | ICD-10-CM | POA: Insufficient documentation

## 2016-09-10 DIAGNOSIS — C7951 Secondary malignant neoplasm of bone: Secondary | ICD-10-CM | POA: Insufficient documentation

## 2016-09-10 DIAGNOSIS — I10 Essential (primary) hypertension: Secondary | ICD-10-CM | POA: Diagnosis not present

## 2016-09-10 DIAGNOSIS — R000 Tachycardia, unspecified: Secondary | ICD-10-CM | POA: Diagnosis not present

## 2016-09-10 DIAGNOSIS — C7931 Secondary malignant neoplasm of brain: Secondary | ICD-10-CM | POA: Diagnosis present

## 2016-09-10 LAB — COMPREHENSIVE METABOLIC PANEL
ALK PHOS: 186 U/L — AB (ref 38–126)
ALT: 35 U/L (ref 14–54)
AST: 38 U/L (ref 15–41)
Albumin: 1.9 g/dL — ABNORMAL LOW (ref 3.5–5.0)
Anion gap: 12 (ref 5–15)
BILIRUBIN TOTAL: 0.7 mg/dL (ref 0.3–1.2)
BUN: 14 mg/dL (ref 6–20)
CALCIUM: 9.5 mg/dL (ref 8.9–10.3)
CO2: 24 mmol/L (ref 22–32)
CREATININE: 0.55 mg/dL (ref 0.44–1.00)
Chloride: 102 mmol/L (ref 101–111)
GFR calc non Af Amer: >60 mL/min (ref >60)
Glucose, Bld: 101 mg/dL — ABNORMAL HIGH (ref 65–99)
Potassium: 3.3 mmol/L — ABNORMAL LOW (ref 3.5–5.1)
SODIUM: 138 mmol/L (ref 135–145)
TOTAL PROTEIN: 6.4 g/dL — AB (ref 6.5–8.1)

## 2016-09-10 LAB — CBC WITH DIFFERENTIAL/PLATELET
Basophils Absolute: 0.2 10*3/uL — ABNORMAL HIGH (ref 0.0–0.1)
Basophils Relative: 1 %
Eosinophils Absolute: 0 10*3/uL (ref 0.0–0.7)
Eosinophils Relative: 0 %
HCT: 24.5 % — ABNORMAL LOW (ref 36.0–46.0)
HEMOGLOBIN: 8 g/dL — AB (ref 12.0–15.0)
LYMPHS ABS: 1.3 10*3/uL (ref 0.7–4.0)
LYMPHS PCT: 4 %
MCH: 29.4 pg (ref 26.0–34.0)
MCHC: 32.7 g/dL (ref 30.0–36.0)
MCV: 90.1 fL (ref 78.0–100.0)
MONOS PCT: 5 %
Monocytes Absolute: 1.7 10*3/uL — ABNORMAL HIGH (ref 0.1–1.0)
NEUTROS ABS: 28.5 10*3/uL — AB (ref 1.7–7.7)
NEUTROS PCT: 90 %
Platelets: 203 10*3/uL (ref 150–400)
RBC: 2.72 MIL/uL — ABNORMAL LOW (ref 3.87–5.11)
RDW: 18.8 % — ABNORMAL HIGH (ref 11.5–15.5)
WBC: 31.7 10*3/uL — ABNORMAL HIGH (ref 4.0–10.5)

## 2016-09-10 LAB — I-STAT TROPONIN, ED: TROPONIN I, POC: 0.57 ng/mL — AB (ref 0.00–0.08)

## 2016-09-10 LAB — I-STAT CG4 LACTIC ACID, ED: Lactic Acid, Venous: 1.19 mmol/L (ref 0.5–1.9)

## 2016-09-10 MED ORDER — SODIUM CHLORIDE 0.9 % IV BOLUS (SEPSIS)
1000.0000 mL | Freq: Once | INTRAVENOUS | Status: AC
  Administered 2016-09-10: 1000 mL via INTRAVENOUS

## 2016-09-10 MED ORDER — HYDROMORPHONE HCL 1 MG/ML IJ SOLN
1.0000 mg | Freq: Once | INTRAMUSCULAR | Status: AC
  Administered 2016-09-11: 1 mg via INTRAVENOUS
  Filled 2016-09-10: qty 1

## 2016-09-10 MED ORDER — ATENOLOL 25 MG PO TABS
25.0000 mg | ORAL_TABLET | Freq: Once | ORAL | Status: AC
  Administered 2016-09-10: 25 mg via ORAL
  Filled 2016-09-10: qty 1

## 2016-09-10 MED ORDER — ONDANSETRON HCL 4 MG/2ML IJ SOLN
4.0000 mg | Freq: Once | INTRAMUSCULAR | Status: AC
Start: 2016-09-10 — End: 2016-09-10
  Administered 2016-09-10: 4 mg via INTRAVENOUS
  Filled 2016-09-10: qty 2

## 2016-09-10 MED ORDER — HYDROMORPHONE HCL 1 MG/ML IJ SOLN
1.0000 mg | Freq: Once | INTRAMUSCULAR | Status: AC
  Administered 2016-09-10: 1 mg via INTRAVENOUS
  Filled 2016-09-10: qty 1

## 2016-09-10 NOTE — ED Notes (Signed)
Pt has been going to rehab for legs after being diagnosed with cancer that has metastasized to bones.

## 2016-09-10 NOTE — ED Notes (Signed)
Date and time results received: 09/10/16 2338 (use smartphrase ".now" to insert current time)  Test: istat troponin  Critical Value: 0.57  Name of Provider Notified: Dr zammit  Orders Received? Or Actions Taken?: Actions Taken: no orders received.

## 2016-09-10 NOTE — ED Notes (Signed)
Pt has an extremely high HR. Pt says when she was in hospital a week ago, she was told the same thing. Have connected pt to cardiac monitor

## 2016-09-10 NOTE — ED Triage Notes (Signed)
Left pain in upper thigh. Started about 30 mins ago. Has had previous problem of legs before but this is the first time she has ever had this type of pain.  Left hospital a week ago and has been going to rehab for legs.   Was in the hospital a week ago for high WBC's.   VSS   NAD

## 2016-09-10 NOTE — ED Provider Notes (Signed)
[facility] DEPT Provider Note   CSN: [id number] Arrival date & time: 09/10/16  7782     History   Chief Complaint Chief Complaint  Patient presents with  . Leg Pain    HPI Heather Hinton is a 56 y.o. female.  Patient has metastatic lung cancer with lytic lesions to her left pubic rami,  she has moved around by her son and starting having severe pain in her left hip.   The history is provided by the patient and a relative. No language interpreter was used.  Leg Pain   This is a new problem. The current episode started 6 to 12 hours ago. The problem occurs constantly. The problem has not changed since onset.The pain is present in the left upper leg. The quality of the pain is described as sharp. The pain is at a severity of 8/10. The pain is severe. Associated symptoms include limited range of motion.    Past Medical History:  Diagnosis Date  . B12 deficiency anemia 07/30/2016  . History of radiation therapy 07/17/16 - 07/30/16 (Right Lung and Left Pelvis), 08/03/16 ([facility]  . Hyperlipidemia   . Hypertension   . Iron deficiency anemia   . Lung cancer ([employer])   . Metastasis to bone ([place])   . Metastasis to brain ([place])   . Severe protein-calorie malnutrition ([place]) 08/23/2016  . Spinal stenosis     Patient Active Problem List   Diagnosis Date Noted  . Palliative care encounter   . Acute respiratory distress   . Pressure injury of skin 09/01/2016  . Lower GI bleed 08/29/2016  . SIRS (systemic inflammatory response syndrome) ([place]) 08/28/2016  . Severe protein-calorie malnutrition ([place]) 08/23/2016  . Goals of care, counseling/discussion 07/31/2016  . B12 deficiency anemia 07/30/2016  . Iron deficiency anemia   . Metastasis to brain ([place])   . 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 Left hip pain   . Palliative care by specialist   . Metastatic lung cancer (metastasis from lung to other site) ([place]) 07/14/2016  . Essential hypertension 07/14/2016    . Hypokalemia 07/14/2016  . Hyperglycemia 07/14/2016  . Antineoplastic chemotherapy induced pancytopenia ([place]) 07/14/2016    Past Surgical History:  Procedure Laterality Date  . IR FLUORO GUIDE PORT INSERTION RIGHT  08/20/2016  . IR US GUIDE VASC ACCESS RIGHT  08/20/2016  . TUBAL LIGATION      OB History    Gravida Para Term Preterm AB Living   9 8           SAB TAB Ectopic Multiple Live Births                   Home Medications    Prior to Admission medications   Medication Sig Start Date End Date Taking? Authorizing Provider  dexamethasone (DECADRON) 4 MG tablet Take 1 tablet (4 mg total) by mouth every 8 (eight) hours. 07/23/16   Theodis Blaze, MD  dexamethasone (DECADRON) 4 MG tablet Take 1 tab two times a day the day before Alimta chemo. Take 2 tabs two times a day starting the day after chemo for 3 days. 08/21/16   Heath Lark, MD  HYDROcodone-acetaminophen (NORCO/VICODIN) 5-325 MG tablet Take 1 tablet by mouth every 4 (four) hours as needed for moderate pain or severe pain. 09/05/16   Isaac Bliss, Rayford Halsted, MD  lidocaine-prilocaine (EMLA) cream Apply 1 application topically as needed. 07/30/16   Heath Lark, MD  lovastatin (MEVACOR)  20 MG tablet Take 20 mg by mouth at bedtime.    [provider]  metoprolol tartrate (LOPRESSOR) 25 MG tablet Take 1 tablet (25 mg total) by mouth 2 (two) times daily. 09/05/16   Isaac Bliss, Rayford Halsted, MD  morphine (MS CONTIN) 15 MG 12 hr tablet Take 1 tablet (15 mg total) by mouth every 12 (twelve) hours. 09/05/16   Isaac Bliss, Rayford Halsted, MD  Multiple Vitamin (MULTIVITAMIN WITH MINERALS) TABS tablet Take 1 tablet by mouth daily.    [provider]  ondansetron (ZOFRAN) 8 MG tablet Take 1 tablet (8 mg total) by mouth 2 (two) times daily as needed for refractory nausea / vomiting. Start on day 3 after chemo. 08/21/16   Heath Lark, MD  pantoprazole (PROTONIX) 40 MG tablet Take 1 tablet (40 mg total) by mouth daily. 08/17/16    Heath Lark, MD  polyethylene glycol (MIRALAX / GLYCOLAX) packet Take 17 g by mouth daily. 07/24/16   Theodis Blaze, MD  prochlorperazine (COMPAZINE) 10 MG tablet Take 1 tablet (10 mg total) by mouth every 6 (six) hours as needed (Nausea or vomiting). 08/21/16   Heath Lark, MD    Family History Family History  Problem Relation Age of Onset  . Hypertension Mother   . Diabetes Mother   . Breast cancer Mother   . Hypertension Father   . Hypertension Sister   . Pancreatic cancer Sister   . Lung cancer Maternal Grandmother     Social History Social History  Substance Use Topics  . Smoking status: Former Smoker    Packs/day: 0.50    Years: 40.00    Types: Cigarettes    Quit date: 10/2015  . Smokeless tobacco: Never Used  . Alcohol use No     Allergies   Iron   Review of Systems Review of Systems  Constitutional: Positive for fatigue. Negative for appetite change.  HENT: Negative for congestion, ear discharge and sinus pressure.   Eyes: Negative for discharge.  Respiratory: Negative for cough.   Cardiovascular: Negative for chest pain.  Gastrointestinal: Negative for abdominal pain and diarrhea.  Genitourinary: Negative for frequency and hematuria.  Musculoskeletal: Negative for back pain.       Left upper leg pain  Skin: Negative for rash.  Neurological: Negative for seizures and headaches.  Psychiatric/Behavioral: Negative for hallucinations.     Physical Exam Updated Vital Signs BP 115/73 (BP Location: Right Arm)   Pulse (!) 135   Temp 98 F (36.7 C) (Oral)   Resp (!) 26   Ht 5\' 4"  (1.626 m)   Wt 59.9 kg (132 lb)   SpO2 99%   BMI 22.66 kg/m   Physical Exam  Constitutional: She is oriented to person, place, and time. She appears well-developed.  HENT:  Head: Normocephalic.  Eyes: Conjunctivae and EOM are normal. No scleral icterus.  Neck: Neck supple. No thyromegaly present.  Cardiovascular: Exam reveals no gallop and no friction rub.   No murmur  heard. Rapid regular rate  Pulmonary/Chest: No stridor. She has no wheezes. She has no rales. She exhibits no tenderness.  Abdominal: She exhibits no distension. There is no tenderness. There is no rebound.  Musculoskeletal: She exhibits no edema.  Tender left femur  Lymphadenopathy:    She has no cervical adenopathy.  Neurological: She is oriented to person, place, and time. She exhibits normal muscle tone. Coordination normal.  Skin: No rash noted. No erythema.  Psychiatric: Her behavior is normal.  ED Treatments / Results  Labs (all labs ordered are listed, but only abnormal results are displayed) Labs Reviewed  CBC WITH DIFFERENTIAL/PLATELET - Abnormal; Notable for the following:       Result Value   WBC 31.7 (*)    RBC 2.72 (*)    Hemoglobin 8.0 (*)    HCT 24.5 (*)    RDW 18.8 (*)    Neutro Abs 28.5 (*)    Monocytes Absolute 1.7 (*)    Basophils Absolute 0.2 (*)    All other components within normal limits  COMPREHENSIVE METABOLIC PANEL - Abnormal; Notable for the following:    Potassium 3.3 (*)    Glucose, Bld 101 (*)    Total Protein 6.4 (*)    Albumin 1.9 (*)    Alkaline Phosphatase 186 (*)    All other components within normal limits  URINALYSIS, ROUTINE W REFLEX MICROSCOPIC  I-STAT CG4 LACTIC ACID, ED  I-STAT TROPONIN, ED    EKG  EKG Interpretation  Date/Time:  Monday September 10 2016 18:54:18 EDT Ventricular Rate:  128 PR Interval:    QRS Duration: 136 QT Interval:  328 QTC Calculation: 479 R Axis:   43 Text Interpretation:  Sinus tachycardia Left bundle branch block Confirmed by Milton Ferguson [phone]) on 09/10/2016 7:06:31 PM       Radiology Dg Chest 2 View  Result Date: 09/10/2016 CLINICAL DATA:  Left leg pain EXAM: CHEST  2 VIEW COMPARISON:  09/01/2016 FINDINGS: Mild linear/streaky opacity in the right mid lung. Possible small right pleural effusion. Left lung is clear. Heart is normal in size. Known adenopathy on the right paratracheal stripe  and mediastinum is unchanged with better visualized on CT. Right chest port terminates in the lower SVC. IMPRESSION: Known adenopathy, better visualized on CT. Mild streaky right midlung opacities. Suspected small right pleural effusion. Electronically Signed   By: Julian Hy M.D.   On: 09/10/2016 21:00   Dg Femur Min 2 Views Left  Result Date: 09/10/2016 CLINICAL DATA:  Left femur pain. No known injury. Lung cancer with brain and bone metastases. EXAM: LEFT FEMUR 2 VIEWS COMPARISON:  Pelvis CT dated 07/14/2016. FINDINGS: Again demonstrated is an extensive lytic lesion of the left ischium and inferior pubic ramus. The left femur has a normal appearance. Mild atheromatous arterial calcifications are noted. IMPRESSION: No significant change in extensive lytic metastatic disease involving the left ischium and inferior pubic ramus. Unremarkable femur. Electronically Signed   By: Claudie Revering M.D.   On: 09/10/2016 21:01    Procedures Procedures (including critical care time)  Medications Ordered in ED Medications  sodium chloride 0.9 % bolus 1,000 mL (1,000 mLs Intravenous New Bag/Given 09/10/16 2001)  HYDROmorphone (DILAUDID) injection 1 mg (1 mg Intravenous Given 09/10/16 2000)  ondansetron (ZOFRAN) injection 4 mg (4 mg Intravenous Given 09/10/16 2000)  sodium chloride 0.9 % bolus 1,000 mL (1,000 mLs Intravenous New Bag/Given 09/10/16 2258)  atenolol (TENORMIN) tablet 25 mg (25 mg Oral Given 09/10/16 2258)     Initial Impression / Assessment and Plan / ED Course  I have reviewed the triage vital signs and the nursing notes.  Pertinent labs & imaging results that were available during my care of the patient were reviewed by me and considered in my medical decision making (see chart for details).     Patient with metastatic lung cancer. She was discharged 5 days ago with dehydration systemic inflammatory response syndrome. Anemia. Patient presents today with increasing tachycardia. Heart rate  was 135.  On discharge from the hospital is 110 patient will be admitted for further evaluation to medicine. Suspect patient is dehydrated and has persistent tachycardia because of metastatic lung cancer  Final Clinical Impressions(s) / ED Diagnoses   Final diagnoses:  Tachycardia    New Prescriptions New Prescriptions   No medications on file     Milton Ferguson, MD 09/10/16 2314

## 2016-09-11 ENCOUNTER — Encounter (HOSPITAL_COMMUNITY): Payer: Self-pay

## 2016-09-11 ENCOUNTER — Ambulatory Visit: Payer: Medicaid Other

## 2016-09-11 ENCOUNTER — Encounter: Payer: Medicaid Other | Admitting: Nutrition

## 2016-09-11 DIAGNOSIS — R000 Tachycardia, unspecified: Secondary | ICD-10-CM

## 2016-09-11 DIAGNOSIS — C7931 Secondary malignant neoplasm of brain: Secondary | ICD-10-CM | POA: Diagnosis not present

## 2016-09-11 DIAGNOSIS — M25552 Pain in left hip: Secondary | ICD-10-CM

## 2016-09-11 DIAGNOSIS — C3492 Malignant neoplasm of unspecified part of left bronchus or lung: Secondary | ICD-10-CM | POA: Diagnosis not present

## 2016-09-11 DIAGNOSIS — E876 Hypokalemia: Secondary | ICD-10-CM | POA: Diagnosis not present

## 2016-09-11 DIAGNOSIS — M79606 Pain in leg, unspecified: Secondary | ICD-10-CM | POA: Diagnosis present

## 2016-09-11 LAB — CBC
HEMATOCRIT: 24.7 % — AB (ref 36.0–46.0)
HEMOGLOBIN: 7.9 g/dL — AB (ref 12.0–15.0)
MCH: 29.3 pg (ref 26.0–34.0)
MCHC: 32 g/dL (ref 30.0–36.0)
MCV: 91.5 fL (ref 78.0–100.0)
Platelets: 232 10*3/uL (ref 150–400)
RBC: 2.7 MIL/uL — AB (ref 3.87–5.11)
RDW: 19.3 % — ABNORMAL HIGH (ref 11.5–15.5)
WBC: 33.7 10*3/uL — ABNORMAL HIGH (ref 4.0–10.5)

## 2016-09-11 LAB — COMPREHENSIVE METABOLIC PANEL
ALBUMIN: 1.6 g/dL — AB (ref 3.5–5.0)
ALT: 32 U/L (ref 14–54)
ANION GAP: 11 (ref 5–15)
AST: 36 U/L (ref 15–41)
Alkaline Phosphatase: 177 U/L — ABNORMAL HIGH (ref 38–126)
BILIRUBIN TOTAL: 0.6 mg/dL (ref 0.3–1.2)
BUN: 13 mg/dL (ref 6–20)
CALCIUM: 9.1 mg/dL (ref 8.9–10.3)
CO2: 21 mmol/L — AB (ref 22–32)
CREATININE: 0.56 mg/dL (ref 0.44–1.00)
Chloride: 107 mmol/L (ref 101–111)
GFR calc Af Amer: >60 mL/min (ref >60)
GFR calc non Af Amer: >60 mL/min (ref >60)
GLUCOSE: 98 mg/dL (ref 65–99)
Potassium: 3.8 mmol/L (ref 3.5–5.1)
SODIUM: 139 mmol/L (ref 135–145)
TOTAL PROTEIN: 5.5 g/dL — AB (ref 6.5–8.1)

## 2016-09-11 MED ORDER — ADULT MULTIVITAMIN W/MINERALS CH
1.0000 | ORAL_TABLET | Freq: Every day | ORAL | Status: DC
  Administered 2016-09-11: 1 via ORAL
  Filled 2016-09-11: qty 1

## 2016-09-11 MED ORDER — SODIUM CHLORIDE 0.9 % IV SOLN
INTRAVENOUS | Status: DC
  Administered 2016-09-11: 02:00:00 via INTRAVENOUS

## 2016-09-11 MED ORDER — POTASSIUM CHLORIDE CRYS ER 20 MEQ PO TBCR
40.0000 meq | EXTENDED_RELEASE_TABLET | Freq: Once | ORAL | Status: AC
  Administered 2016-09-11: 40 meq via ORAL
  Filled 2016-09-11: qty 2

## 2016-09-11 MED ORDER — METOPROLOL TARTRATE 25 MG PO TABS: 25.0000 mg | ORAL_TABLET | Freq: Two times a day (BID) | ORAL | Status: DC

## 2016-09-11 MED ORDER — HYDROCODONE-ACETAMINOPHEN 5-325 MG PO TABS: 1.0000 | ORAL_TABLET | ORAL | 0 refills | Status: AC | PRN

## 2016-09-11 MED ORDER — PANTOPRAZOLE SODIUM 40 MG PO TBEC
40.0000 mg | DELAYED_RELEASE_TABLET | Freq: Every day | ORAL | Status: DC
  Administered 2016-09-11: 40 mg via ORAL
  Filled 2016-09-11: qty 1

## 2016-09-11 MED ORDER — PRAVASTATIN SODIUM 10 MG PO TABS: 20.0000 mg | ORAL_TABLET | Freq: Every day | ORAL | Status: DC

## 2016-09-11 MED ORDER — HYDROCODONE-ACETAMINOPHEN 5-325 MG PO TABS
1.0000 | ORAL_TABLET | ORAL | Status: DC | PRN
  Administered 2016-09-11 (×2): 1 via ORAL
  Filled 2016-09-11 (×2): qty 1

## 2016-09-11 MED ORDER — ONDANSETRON HCL 4 MG PO TABS: 4.0000 mg | ORAL_TABLET | Freq: Four times a day (QID) | ORAL | Status: DC | PRN

## 2016-09-11 MED ORDER — ENOXAPARIN SODIUM 40 MG/0.4ML ~~LOC~~ SOLN
40.0000 mg | SUBCUTANEOUS | Status: DC
  Administered 2016-09-11: 40 mg via SUBCUTANEOUS
  Filled 2016-09-11: qty 0.4

## 2016-09-11 MED ORDER — ACETAMINOPHEN 650 MG RE SUPP: 650.0000 mg | Freq: Four times a day (QID) | RECTAL | Status: DC | PRN

## 2016-09-11 MED ORDER — ACETAMINOPHEN 325 MG PO TABS: 650.0000 mg | ORAL_TABLET | Freq: Four times a day (QID) | ORAL | Status: DC | PRN

## 2016-09-11 MED ORDER — ORAL CARE MOUTH RINSE
15.0000 mL | Freq: Two times a day (BID) | OROMUCOSAL | Status: DC
  Administered 2016-09-11: 15 mL via OROMUCOSAL

## 2016-09-11 MED ORDER — ONDANSETRON HCL 4 MG/2ML IJ SOLN: 4.0000 mg | Freq: Four times a day (QID) | INTRAMUSCULAR | Status: DC | PRN

## 2016-09-11 MED ORDER — POLYETHYLENE GLYCOL 3350 17 G PO PACK
17.0000 g | PACK | Freq: Every day | ORAL | Status: DC
  Administered 2016-09-11: 17 g via ORAL
  Filled 2016-09-11 (×2): qty 1

## 2016-09-11 MED ORDER — MORPHINE SULFATE ER 15 MG PO TBCR
15.0000 mg | EXTENDED_RELEASE_TABLET | Freq: Two times a day (BID) | ORAL | Status: DC
  Administered 2016-09-11 (×2): 15 mg via ORAL
  Filled 2016-09-11 (×2): qty 1

## 2016-09-11 MED ORDER — DEXAMETHASONE 4 MG PO TABS
4.0000 mg | ORAL_TABLET | Freq: Three times a day (TID) | ORAL | Status: DC
  Administered 2016-09-11 (×2): 4 mg via ORAL
  Filled 2016-09-11 (×2): qty 1

## 2016-09-11 NOTE — Progress Notes (Addendum)
Dallie Dad discharged [facility] facility per MD order.  Discharge instructions reviewed and discussed with the patient, all questions and concerns answered.   Allergies as of 09/11/2016      Reactions   Iron Nausea And Vomiting      Medication List    TAKE these medications   dexamethasone 4 MG tablet Commonly known as:  DECADRON Take 1 tablet (4 mg total) by mouth every 8 (eight) hours.   dexamethasone 4 MG tablet Commonly known as:  DECADRON Take 1 tab two times a day the day before Alimta chemo. Take 2 tabs two times a day starting the day after chemo for 3 days.   HYDROcodone-acetaminophen 5-325 MG tablet Commonly known as:  NORCO/VICODIN Take 1-2 tablets by mouth every 4 (four) hours as needed for moderate pain or severe pain. What changed:  how much to take   lidocaine-prilocaine cream Commonly known as:  EMLA Apply 1 application topically as needed.   lovastatin 20 MG tablet Commonly known as:  MEVACOR Take 20 mg by mouth at bedtime.   metoprolol tartrate 25 MG tablet Commonly known as:  LOPRESSOR Take 1 tablet (25 mg total) by mouth 2 (two) times daily.   morphine 15 MG 12 hr tablet Commonly known as:  MS CONTIN Take 1 tablet (15 mg total) by mouth every 12 (twelve) hours.   multivitamin with minerals Tabs tablet Take 1 tablet by mouth daily.   ondansetron 8 MG tablet Commonly known as:  ZOFRAN Take 1 tablet (8 mg total) by mouth 2 (two) times daily as needed for refractory nausea / vomiting. Start on day 3 after chemo.   pantoprazole 40 MG tablet Commonly known as:  PROTONIX Take 1 tablet (40 mg total) by mouth daily.   polyethylene glycol packet Commonly known as:  MIRALAX / GLYCOLAX Take 17 g by mouth daily. What changed:  when to take this  additional instructions   prochlorperazine 10 MG tablet Commonly known as:  COMPAZINE Take 1 tablet (10 mg total) by mouth every 6 (six) hours as needed (Nausea or  vomiting).       Patients skin is clean, dry and intact, no evidence of skin break down. IV site discontinued and catheter remains intact. Site without signs and symptoms of complications. Dressing and pressure applied. Pt being transported by [facility]. Patient escorted to car by NT in a wheelchair,  no distress noted upon discharge.  Ralene Muskrat Judye Lorino 09/11/2016 4:41 PM

## 2016-09-11 NOTE — Progress Notes (Addendum)
LCSW consulted as patient is recent discharge to SNF: [facility].  Patient is LTC resident placed last week at [facility]   Patient placed just over a week ago. Patient will return to SNF for continued to stay and LOG (30 days) remain in effect.  No FL2 needed for return. Will need EMS transport to return per request of husband.  Will discuss with facility to see if they can transport patient as well. Discussed case with facility: [facility] and agreeable for plan.  Will follow for discharge needs.  Plan:  Return to SNF Facility can pick up patient around 4pm via [facility] per facility.  MD made aware, awaiting orders.  Lane Hacker, MSW Clinical Social Work: [employer] for :  [phone]

## 2016-09-11 NOTE — Progress Notes (Signed)
Pt's discharge packet was not sent with pt. [facility]. Spoke with April who requested that paperwork be faxed. Paperwork faxed to [facility] and packet left with secretary in case it is need at a later time.

## 2016-09-11 NOTE — Progress Notes (Signed)
Called report to Jenny Reichmann, Therapist, sports at the [facility] in [place].

## 2016-09-11 NOTE — H&P (Signed)
TRH H&P    Patient Demographics:    Heather Hinton, is a 56 y.o. female  MRN: [id number]  DOB - 1960/04/23  Admit Date - 09/10/2016  Referring MD/NP/PA: Dr. Roderic Palau  Outpatient Primary MD for the patient is Neale Burly, MD  Patient coming from: Home  Chief Complaint  Patient presents with  . Leg Pain      HPI:    Heather Hinton  is a 55 y.o. 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 Today patient was brought to the hospital after she had left leg pain, which started when patient was moved by her son, when sitting in wheelchair. Patient says that in was 10/10 in intensity which improved. Currently she is not having any pain.  In the ED patient was found to have tachycardia with heart rate 130s, WBC 31,000. She is afebrile, no disability. No chest or shortness of breath. No nausea vomiting or diarrhea.    Review of systems:     All other systems reviewed and are negative.   With Past History of the following :    Past Medical History:  Diagnosis Date  . B12 deficiency anemia 07/30/2016  . History of radiation therapy 07/17/16 - 07/30/16 (Right Lung and Left Pelvis), 08/03/16 ([place] [facility]  . Hyperlipidemia   . Hypertension   . Iron deficiency anemia   . Lung cancer ([place])   . Metastasis to bone ([place])   . Metastasis to brain ([place])   . Severe protein-calorie malnutrition ([place]) 08/23/2016  . Spinal stenosis       Past Surgical History:  Procedure Laterality Date  . IR FLUORO GUIDE PORT INSERTION RIGHT  08/20/2016  . IR US GUIDE VASC ACCESS RIGHT  08/20/2016  . TUBAL LIGATION        Social History:      Social History    Substance Use Topics  . Smoking status: Former Smoker    Packs/day: 0.50    Years: 40.00    Types: Cigarettes    Quit date: 10/2015  . Smokeless tobacco: Never Used  . Alcohol use No       Family History :     Family History  Problem Relation Age of Onset  . Hypertension Mother   . Diabetes Mother   . Breast cancer Mother   . Hypertension Father   . Hypertension Sister   . Pancreatic cancer Sister   . Lung cancer Maternal Grandmother       Home Medications:   Prior to Admission medications   Medication Sig Start Date End Date Taking? Authorizing Provider  dexamethasone (DECADRON) 4 MG tablet Take 1 tablet (4 mg total) by mouth every 8 (eight) hours. 07/23/16   Theodis Blaze, MD  dexamethasone (DECADRON) 4 MG tablet Take 1 tab two times a day the day before Alimta  chemo. Take 2 tabs two times a day starting the day after chemo for 3 days. 08/21/16   Heath Lark, MD  HYDROcodone-acetaminophen (NORCO/VICODIN) 5-325 MG tablet Take 1 tablet by mouth every 4 (four) hours as needed for moderate pain or severe pain. 09/05/16   Isaac Bliss, Rayford Halsted, MD  lidocaine-prilocaine (EMLA) cream Apply 1 application topically as needed. 07/30/16   Heath Lark, MD  lovastatin (MEVACOR) 20 MG tablet Take 20 mg by mouth at bedtime.    [provider]  metoprolol tartrate (LOPRESSOR) 25 MG tablet Take 1 tablet (25 mg total) by mouth 2 (two) times daily. 09/05/16   Isaac Bliss, Rayford Halsted, MD  morphine (MS CONTIN) 15 MG 12 hr tablet Take 1 tablet (15 mg total) by mouth every 12 (twelve) hours. 09/05/16   Isaac Bliss, Rayford Halsted, MD  Multiple Vitamin (MULTIVITAMIN WITH MINERALS) TABS tablet Take 1 tablet by mouth daily.    [provider]  ondansetron (ZOFRAN) 8 MG tablet Take 1 tablet (8 mg total) by mouth 2 (two) times daily as needed for refractory nausea / vomiting. Start on day 3 after chemo. 08/21/16   Heath Lark, MD  pantoprazole (PROTONIX) 40 MG tablet Take 1 tablet  (40 mg total) by mouth daily. 08/17/16   Heath Lark, MD  polyethylene glycol (MIRALAX / GLYCOLAX) packet Take 17 g by mouth daily. 07/24/16   Theodis Blaze, MD  prochlorperazine (COMPAZINE) 10 MG tablet Take 1 tablet (10 mg total) by mouth every 6 (six) hours as needed (Nausea or vomiting). 08/21/16   Heath Lark, MD     Allergies:     Allergies  Allergen Reactions  . Iron Nausea And Vomiting     Physical Exam:   Vitals  Blood pressure 130/82, pulse (!) 117, temperature 98 F (36.7 C), temperature source Oral, resp. rate (!) 26, height 5\' 4"  (1.626 m), weight 59.9 kg (132 lb), SpO2 95 %.  1.  General: Appears in no acute distress  2. Psychiatric:  Intact judgement and  insight, awake alert, oriented x 3.  3. Neurologic: No focal neurological deficits, all cranial nerves intact.Strength 5/5 all 4 extremities, sensation intact all 4 extremities, plantars down going.  4. Eyes :  anicteric sclerae, moist conjunctivae with no lid lag. PERRLA.  5. ENMT:  Oropharynx clear with moist mucous membranes and good dentition  6. Neck:  supple, no cervical lymphadenopathy appriciated, No thyromegaly  7. Respiratory : Normal respiratory effort, good air movement bilaterally,clear to  auscultation bilaterally  8. Cardiovascular : RRR, no gallops, rubs or murmurs, no leg edema  9. Gastrointestinal:  Positive bowel sounds, abdomen soft, non-tender to palpation,no hepatosplenomegaly, no rigidity or guarding       10. Skin:  No cyanosis, normal texture and turgor, no rash, lesions or ulcers  11.Musculoskeletal:  Good muscle tone,  joints appear normal , no effusions,  normal range of motion    Data Review:    CBC  Recent Labs Lab 09/04/16 0506 09/05/16 0526 09/10/16 1952  WBC 13.4* 15.7* 31.7*  HGB 8.5* 8.4* 8.0*  HCT 25.5* 26.0* 24.5*  PLT 58* 70* 203  MCV 85.9 86.4 90.1  MCH 28.6 27.9 29.4  MCHC 33.3 32.3 32.7  RDW 17.7* 17.9* 18.8*  LYMPHSABS  --   --  1.3  MONOABS   --   --  1.7*  EOSABS  --   --  0.0  BASOSABS  --   --  0.2*   ------------------------------------------------------------------------------------------------------------------  Chemistries  Recent Labs Lab 09/04/16 0506 09/10/16 1952  NA 135 138  K 3.7 3.3*  CL 102 102  CO2 23 24  GLUCOSE 93 101*  BUN 8 14  CREATININE 0.56 0.55  CALCIUM 8.9 9.5  AST 26 38  ALT 25 35  ALKPHOS 162* 186*  BILITOT 0.8 0.7   ------------------------------------------------------------------------------------------------------------------  ------------------------------------------------------------------------------------------------------------------ GFR: Estimated Creatinine Clearance: 67.8 mL/min (by C-G formula based on SCr of 0.55 mg/dL). Liver Function Tests:  Recent Labs Lab 09/04/16 0506 09/10/16 1952  AST 26 38  ALT 25 35  ALKPHOS 162* 186*  BILITOT 0.8 0.7  PROT 5.0* 6.4*  ALBUMIN 1.5* 1.9*   No results for input(s): LIPASE, AMYLASE in the last 168 hours. No results for input(s): AMMONIA in the last 168 hours. Coagulation Profile: No results for input(s): INR, PROTIME in the last 168 hours. Cardiac Enzymes: No results for input(s): CKTOTAL, CKMB, CKMBINDEX, TROPONINI in the last 168 hours. BNP (last 3 results) No results for input(s): PROBNP in the last 8760 hours. HbA1C: No results for input(s): HGBA1C in the last 72 hours. CBG:  Recent Labs Lab 09/05/16 1110 09/05/16 1616 09/05/16 2120 09/06/16 0704 09/06/16 1056  GLUCAP 103* 100* 95 81 71   Lipid Profile: No results for input(s): CHOL, HDL, LDLCALC, TRIG, CHOLHDL, LDLDIRECT in the last 72 hours. Thyroid Function Tests: No results for input(s): TSH, T4TOTAL, FREET4, T3FREE, THYROIDAB in the last 72 hours. Anemia Panel: No results for input(s): VITAMINB12, FOLATE, FERRITIN, TIBC, IRON, RETICCTPCT in the last 72  hours.  --------------------------------------------------------------------------------------------------------------- Urine analysis:    Component Value Date/Time   COLORURINE STRAW (A) 07/14/2016 1856   APPEARANCEUR CLEAR 07/14/2016 1856   LABSPEC 1.020 07/14/2016 1856   PHURINE 7.0 07/14/2016 1856   GLUCOSEU NEGATIVE 07/14/2016 1856   HGBUR NEGATIVE 07/14/2016 1856   BILIRUBINUR NEGATIVE 07/14/2016 1856   KETONESUR NEGATIVE 07/14/2016 1856   PROTEINUR NEGATIVE 07/14/2016 1856   NITRITE NEGATIVE 07/14/2016 1856   LEUKOCYTESUR NEGATIVE 07/14/2016 1856      Imaging Results:    Dg Chest 2 View  Result Date: 09/10/2016 CLINICAL DATA:  Left leg pain EXAM: CHEST  2 VIEW COMPARISON:  09/01/2016 FINDINGS: Mild linear/streaky opacity in the right mid lung. Possible small right pleural effusion. Left lung is clear. Heart is normal in size. Known adenopathy on the right paratracheal stripe and mediastinum is unchanged with better visualized on CT. Right chest port terminates in the lower SVC. IMPRESSION: Known adenopathy, better visualized on CT. Mild streaky right midlung opacities. Suspected small right pleural effusion. Electronically Signed   By: Julian Hy M.D.   On: 09/10/2016 21:00   Dg Femur Min 2 Views Left  Result Date: 09/10/2016 CLINICAL DATA:  Left femur pain. No known injury. Lung cancer with brain and bone metastases. EXAM: LEFT FEMUR 2 VIEWS COMPARISON:  Pelvis CT dated 07/14/2016. FINDINGS: Again demonstrated is an extensive lytic lesion of the left ischium and inferior pubic ramus. The left femur has a normal appearance. Mild atheromatous arterial calcifications are noted. IMPRESSION: No significant change in extensive lytic metastatic disease involving the left ischium and inferior pubic ramus. Unremarkable femur. Electronically Signed   By: Claudie Revering M.D.   On: 09/10/2016 21:01    My personal review of EKG: - sinus tachycardia   Assessment & Plan:    Active  Problems:   Metastatic lung cancer (metastasis from lung to other site) [facility])   Hypokalemia   Left hip pain   Metastasis to brain ([place])  Leg pain   Chronic anemia  1. Left hip pain- resolved, likely muscular. X-ray of left femur showed no significant change in extensive lytic metastatic disease involving the left ischium and inferior pubic ramus. Will continue Vicodin when necessary for pain. 2. SIRS- Leukocytosis- 31,000, patient was neutropenic and received injection of Neupogen before discharge on 09/05/2016. She does not appear to be septic. Afebrile. Will follow CBC in a.m. 3. Sinus tachycardia-patient was discharged on metoprolol 5 days ago, she received atenolol 25 mg in the ED and heart rate has improved. Continue metoprolol 25 mg by mouth twice a day from tomorrow morning. 4. Metastasis to brain-continue Decadron 4 mg 3 times a day 5. Anemia-likely from chronic GI bleed, she was transfused 3 units PRBC during previous admission. We'll closely monitor CBC in the hospital and transfuse as needed for hemoglobin less than 7 6. Metastatic lung cancer- patient received chemotherapy with carboplatin and Alimta. Follow with oncology as outpatient. 7. Hypokalemia- replace potassium and check BMP in the morning   DVT Prophylaxis-   Lovenox   AM Labs Ordered, also please review Full Orders  Family Communication: Admission, patients condition and plan of care including tests being ordered have been discussed with the patient and Her son at bedside who indicate understanding and agree with the plan and Code Status.  Code Status:  DO NOT RESUSCITATE  Admission status: Observation    Time spent in minutes : 60 minutes   Nicoya Friel S M.D on 09/11/2016 at 12:20 AM  Between 7am to 7pm - Pager - [phone]. After 7pm go to [email or web address] - password [email or web address]  [facility] - Office  [phone]

## 2016-09-11 NOTE — Progress Notes (Signed)
Pt without PIV access at this time, three unsuccessful attempts. MD aware, states to make IV aware

## 2016-09-11 NOTE — Discharge Summary (Signed)
Physician Discharge Summary  Heather Hinton KDX:[id number] DOB: [birth year] DOA: 09/10/2016  PCP: Neale Burly, MD  Admit date: 09/10/2016 Discharge date: 09/11/2016  Admitted From: home Disposition:  home  Recommendations for Outpatient Follow-up:  1. Follow up with PCP in 1-2 weeks 2. 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 She was brought to the hospital admission with worsening left leg pain. X-rays of her left leg did not show any significant changes. She does have extensive lytic metastatic disease involving the left ischium and inferior pubic ramus. The patient was admitted for further symptom management. Since admission, her overall pain is better. She feels as though she can manage her pain with oral pain medications. She was noted to have a significant leukocytosis, but is on steroids as well as received Neulasta recently. She was mildly dehydrated and was started on IV fluids. She is chronically anemic and hemoglobin is at baseline. Overall, she appears to be approaching her baseline. She is to follow-up with her primary oncologist for further chemotherapy. She does have some concerns regarding further quality of life and prognosis. I have recommended that she discusses with her oncologist. She will return to skilled nursing facility today. She will be continued on MS Contin for basal pain coverage and will increase hydrocodone dose for breakthrough. It may be beneficial to have palliative care follow the patient at the nursing home.  Discharge Diagnoses:  Active Problems:   Metastatic lung cancer (metastasis from lung to other site) [facility])   Hypokalemia   Left hip pain   Metastasis to brain [facility])   Leg  pain    Discharge Instructions  Discharge Instructions    Diet - low sodium heart healthy    Complete by:  As directed    Increase activity slowly    Complete by:  As directed      Allergies as of 09/11/2016      Reactions   Iron Nausea And Vomiting      Medication List    TAKE these medications   dexamethasone 4 MG tablet Commonly known as:  DECADRON Take 1 tablet (4 mg total) by mouth every 8 (eight) hours.   dexamethasone 4 MG tablet Commonly known as:  DECADRON Take 1 tab two times a day the day before Alimta chemo. Take 2 tabs two times a day starting the day after chemo for 3 days.   HYDROcodone-acetaminophen 5-325 MG tablet Commonly known as:  NORCO/VICODIN Take 1-2 tablets by mouth every 4 (four) hours as needed for moderate pain or severe pain. What changed:  how much to take   lidocaine-prilocaine cream Commonly known as:  EMLA Apply 1 application topically as needed.   lovastatin 20 MG tablet Commonly known as:  MEVACOR Take 20 mg by mouth at bedtime.   metoprolol tartrate 25 MG tablet Commonly known as:  LOPRESSOR Take 1 tablet (25 mg total) by mouth 2 (two) times daily.   morphine 15 MG 12 hr tablet Commonly known as:  MS CONTIN Take 1 tablet (15 mg total) by mouth every 12 (twelve) hours.   multivitamin with minerals Tabs tablet Take 1 tablet by mouth daily.   ondansetron 8 MG tablet Commonly known as:  ZOFRAN Take 1 tablet (8 mg total) by mouth 2 (two) times daily  as needed for refractory nausea / vomiting. Start on day 3 after chemo.   pantoprazole 40 MG tablet Commonly known as:  PROTONIX Take 1 tablet (40 mg total) by mouth daily.   polyethylene glycol packet Commonly known as:  MIRALAX / GLYCOLAX Take 17 g by mouth daily. What changed:  when to take this  additional instructions   prochlorperazine 10 MG tablet Commonly known as:  COMPAZINE Take 1 tablet (10 mg total) by mouth every 6 (six) hours as needed (Nausea or  vomiting).      Contact information for after-discharge care    [facility] .   Specialty:  [place] information: [place] [phone]             Allergies  Allergen Reactions  . Iron Nausea And Vomiting    Consultations:     Procedures/Studies: Dg Chest 2 View  Result Date: 09/10/2016 CLINICAL DATA:  Left leg pain EXAM: CHEST  2 VIEW COMPARISON:  09/01/2016 FINDINGS: Mild linear/streaky opacity in the right mid lung. Possible small right pleural effusion. Left lung is clear. Heart is normal in size. Known adenopathy on the right paratracheal stripe and mediastinum is unchanged with better visualized on CT. Right chest port terminates in the lower SVC. IMPRESSION: Known adenopathy, better visualized on CT. Mild streaky right midlung opacities. Suspected small right pleural effusion. Electronically Signed   By: Julian Hy M.D.   On: 09/10/2016 21:00   Dg Chest 2 View  Result Date: 08/28/2016 CLINICAL DATA:  56 year old female with increasing shortness of breath since yesterday. History of lung cancer on chemotherapy. EXAM: CHEST  2 VIEW COMPARISON:  Chest x-ray 08/20/2016. FINDINGS: Right internal jugular power porta cath with tip terminating in the mid superior vena cava. No acute consolidative airspace disease. No pleural effusions. No evidence of pulmonary edema. Heart size is normal. Prominence of the right paratracheal soft tissues and right hilar soft tissues, similar to prior examination. Lateral projection also demonstrates a prominent anterior mediastinal mass, similar to the prior study. IMPRESSION: 1. No radiographic evidence of acute cardiopulmonary disease. 2. Right hilar and mediastinal lymphadenopathy, similar to the prior examination. Electronically Signed   By: Vinnie Langton M.D.   On: 08/28/2016 12:56   Dg Chest 2 View  Result Date: 08/20/2016 CLINICAL  DATA:  Initial evaluation for acute fever. EXAM: CHEST  2 VIEW COMPARISON:  Prior radiograph from 07/14/2016. FINDINGS: Interval placement of right-sided Port-A-Cath with tip overlying the distal SVC. Stable cardiac and mediastinal silhouettes with right hilar and mediastinal adenopathy. Lungs normally inflated. Right middle lobe mass grossly unchanged. No focal infiltrates. No pulmonary edema or pleural effusion. No pneumothorax. No acute osseus abnormality. IMPRESSION: 1. Interval placement of right-sided Port-A-Cath with tip overlying the distal SVC. 2. The No significant interval change in right middle lobe mass with associated hilar and mediastinal adenopathy. 3. No other active cardiopulmonary disease. Electronically Signed   By: Jeannine Boga M.D.   On: 08/20/2016 23:05   Ct Angio Chest Pe W And/or Wo Contrast  Result Date: 08/28/2016 CLINICAL DATA:  History of lung carcinoma with shortness of breath, initial encounter EXAM: CT ANGIOGRAPHY CHEST WITH CONTRAST TECHNIQUE: Multidetector CT imaging of the chest was performed using the standard protocol during bolus administration of intravenous contrast. Multiplanar CT image reconstructions and MIPs were obtained to evaluate the vascular anatomy. CONTRAST:  75 mL Isovue 370. COMPARISON:  07/14/2016 FINDINGS: Cardiovascular: Thoracic aorta  is well visualize without significant atherosclerotic calcifications or aneurysmal dilatation. No dissection is identified. No significant cardiac enlargement is seen. No coronary calcifications are noted. The pulmonary artery demonstrates a normal branching pattern. No definitive filling defect is identified to suggest pulmonary embolism. Mediastinum/Nodes: Soft tissue density is noted in the right supraclavicular region which measures 3.4 by 2.5 cm. This was not as well evaluated on the prior CT examination is consistent with nodal metastatic disease. Right chest wall port is now seen in satisfactory position.  Considerable right paratracheal and pretracheal adenopathy is noted this demonstrates some decrease in size now measuring approximately 4 cm in AP dimension previously measuring 5.2 cm. Additionally subcarinal and right hilar adenopathy is noted. These are relatively stable from the prior exam. Anterior mediastinal adenopathy is also noted stable from the prior exam. The esophagus as visualized is within normal limits. Prominent lymph nodes are noted in the left axilla largest of these measures approximately 13 cm in short axis enlarged from previous exam. Subcutaneous lesion in the right axilla is noted measuring 16 mm best seen on image number 86 of series 5 which was not as well appreciated on the prior exam due to the imaging technique. Lungs/Pleura: Diffuse emphysematous changes are identified. The left lung is well aerated without focal infiltrate or sizable effusion. The previously seen soft tissue lesion along the medial aspect of the left lower lobe is again identified and appears enlarged now measuring 2.7 x 2.2 cm. It previously measured 1.8 x 1.0 cm. This is again felt to be arising from the chest wall and consistent with a focal metastatic lesion. Right middle lobe mass lesion is again identified but appears slightly smaller now measuring 1.8 cm in greatest dimension. This is decreased from 2.8 cm on the prior exam. The degree of right middle lobe collapse has improved when compared with the prior exam. The bronchial tree is now patent extending into the right middle lobe. Upper Abdomen: Few scattered hyperdensities are noted within the liver likely representing hemangiomas. There stable from the prior exam. Enlargement of both adrenal glands is now seen. The right adrenal gland measures approximately 2.9 cm in greatest dimension. The left adrenal gland measures approximately 3.0 cm in greatest dimension. Given the findings in the chest this likely represents focal metastatic disease and is new from the  prior exam. Few scattered small rounded densities are noted within the omentum which may represent peritoneal metastatic disease. Irregularity of the upper pole of the right kidney remains. Musculoskeletal: Scattered lucencies are noted within the thoracic spine as well as within the ribcage consistent with metastatic disease. This is new from the prior exam bone scan may be helpful for further evaluation as clinically indicated. The most prominent of these lesions lies in the left half of the T9 vertebral body. Better visualized on the current examination is a 3.4 cm soft tissue lesion within the subcutaneous fat just to the right of the midline in the midthoracic spine along the paraspinal musculature. A few smaller subcutaneous lesions are noted in the posterior chest wall. Review of the MIP images confirms the above findings. IMPRESSION: No evidence of pulmonary embolism. Findings consistent with the patient's known history of lung carcinoma with metastatic disease. The overall size of the right middle lobe mass lesion has decreased in the interval from the prior exam. There has been some increase in metastatic disease particularly in the thoracic spine and axillary regions bilaterally as well as in the left paraspinal region and subcutaneous tissues  in the posterior chest wall. Right supraclavicular adenopathy is noted which was not as well visualized on a prior exam due to the imaging technique. Some decrease is noted within the pretracheal and peritracheal adenopathy when compared with the prior exam. Emphysema (ICD10-J43.9). Electronically Signed   By: Inez Catalina M.D.   On: 08/28/2016 14:04   Ir US Guide Vasc Access Right  Result Date: 08/20/2016 CLINICAL DATA:  Metastatic lung cancer, access for chemotherapy. EXAM: RIGHT INTERNAL JUGULAR SINGLE LUMEN POWER PORT CATHETER INSERTION Date:  7/9/20187/10/2016 1:40 pm Radiologist:  M. Daryll Brod, MD Guidance:  Ultrasound and fluoroscopic MEDICATIONS: Ancef  2 g; The antibiotic was administered within an appropriate time interval prior to skin puncture. ANESTHESIA/SEDATION: Versed 3.0 mg IV; Fentanyl 100 mcg IV; Moderate Sedation Time:  28 minutes The patient was continuously monitored during the procedure by the interventional radiology nurse under my direct supervision. FLUOROSCOPY TIME:  36 seconds (4 mGy) COMPLICATIONS: None immediate. CONTRAST:  None. PROCEDURE: Informed consent was obtained from the patient following explanation of the procedure, risks, benefits and alternatives. The patient understands, agrees and consents for the procedure. All questions were addressed. A time out was performed. Maximal barrier sterile technique utilized including caps, mask, sterile gowns, sterile gloves, large sterile drape, hand hygiene, and 2% chlorhexidine scrub. Under sterile conditions and local anesthesia, right internal jugular micropuncture venous access was performed. Access was performed with ultrasound. Images were obtained for documentation. A guide wire was inserted followed by a transitional dilator. This allowed insertion of a guide wire and catheter into the IVC. Measurements were obtained from the SVC / RA junction back to the right IJ venotomy site. In the right infraclavicular chest, a subcutaneous pocket was created over the second anterior rib. This was done under sterile conditions and local anesthesia. 1% lidocaine with epinephrine was utilized for this. A 2.5 cm incision was made in the skin. Blunt dissection was performed to create a subcutaneous pocket over the right pectoralis major muscle. The pocket was flushed with saline vigorously. There was adequate hemostasis. The port catheter was assembled and checked for leakage. The port catheter was secured in the pocket with two retention sutures. The tubing was tunneled subcutaneously to the right venotomy site and inserted into the SVC/RA junction through a valved peel-away sheath. Position was  confirmed with fluoroscopy. Images were obtained for documentation. The patient tolerated the procedure well. No immediate complications. Incisions were closed in a two layer fashion with 4 - 0 Vicryl suture. Dermabond was applied to the skin. The port catheter was accessed, blood was aspirated followed by saline and heparin flushes. Needle was removed. A dry sterile dressing was applied. IMPRESSION: Ultrasound and fluoroscopically guided right internal jugular single lumen power port catheter insertion. Tip in the SVC/RA junction. Catheter ready for use. Electronically Signed   By: Jerilynn Mages.  Shick M.D.   On: 08/20/2016 13:53   Dg Chest Port 1 View  Result Date: 09/01/2016 CLINICAL DATA:  Acute respiratory distress EXAM: PORTABLE CHEST 1 VIEW COMPARISON:  08/28/2016 FINDINGS: Right-sided central venous port tip overlies the SVC. Development of bilateral right greater than left ground-glass density throughout the lung fields. No large effusion. Stable cardiomediastinal silhouette with enlarged mediastinal contour corresponding to CT demonstrated mass/adenopathy. No pneumothorax. IMPRESSION: 1. Interim development of bilateral right greater than left ground-glass density which may be secondary to asymmetric edema, or diffuse infection. 2. Stable enlarged mediastinal silhouette corresponding to adenopathy demonstrated on CT Electronically Signed   By: Madie Reno.D.  On: 09/01/2016 21:25   Dg Hip Unilat With Pelvis Min 4 Views Left  Result Date: 09/03/2016 CLINICAL DATA:  Metastatic lung cancer weakness in the left hip EXAM: DG HIP (WITH OR WITHOUT PELVIS) 1V*L* COMPARISON:  05/03/2016 FINDINGS: SI joints are symmetric. No acute displaced fracture or malalignment. Expansile lucent lesion within the left inferior pubic ramus and extending to the acetabulum with bony destruction evident. IMPRESSION: 1. Large expansile lucent lesion within the left inferior pubic ramus and likely extending to the inferior  acetabulum concerning for metastatic bone lesion given history of lung cancer. There is no obvious fracture or dislocation. Electronically Signed   By: Donavan Foil M.D.   On: 09/03/2016 17:03   Dg Femur Min 2 Views Left  Result Date: 09/10/2016 CLINICAL DATA:  Left femur pain. No known injury. Lung cancer with brain and bone metastases. EXAM: LEFT FEMUR 2 VIEWS COMPARISON:  Pelvis CT dated 07/14/2016. FINDINGS: Again demonstrated is an extensive lytic lesion of the left ischium and inferior pubic ramus. The left femur has a normal appearance. Mild atheromatous arterial calcifications are noted. IMPRESSION: No significant change in extensive lytic metastatic disease involving the left ischium and inferior pubic ramus. Unremarkable femur. Electronically Signed   By: Claudie Revering M.D.   On: 09/10/2016 21:01   Ir Fluoro Guide Port Insertion Right  Result Date: 08/20/2016 CLINICAL DATA:  Metastatic lung cancer, access for chemotherapy. EXAM: RIGHT INTERNAL JUGULAR SINGLE LUMEN POWER PORT CATHETER INSERTION Date:  7/9/20187/10/2016 1:40 pm Radiologist:  M. Daryll Brod, MD Guidance:  Ultrasound and fluoroscopic MEDICATIONS: Ancef 2 g; The antibiotic was administered within an appropriate time interval prior to skin puncture. ANESTHESIA/SEDATION: Versed 3.0 mg IV; Fentanyl 100 mcg IV; Moderate Sedation Time:  28 minutes The patient was continuously monitored during the procedure by the interventional radiology nurse under my direct supervision. FLUOROSCOPY TIME:  36 seconds (4 mGy) COMPLICATIONS: None immediate. CONTRAST:  None. PROCEDURE: Informed consent was obtained from the patient following explanation of the procedure, risks, benefits and alternatives. The patient understands, agrees and consents for the procedure. All questions were addressed. A time out was performed. Maximal barrier sterile technique utilized including caps, mask, sterile gowns, sterile gloves, large sterile drape, hand hygiene, and 2%  chlorhexidine scrub. Under sterile conditions and local anesthesia, right internal jugular micropuncture venous access was performed. Access was performed with ultrasound. Images were obtained for documentation. A guide wire was inserted followed by a transitional dilator. This allowed insertion of a guide wire and catheter into the IVC. Measurements were obtained from the SVC / RA junction back to the right IJ venotomy site. In the right infraclavicular chest, a subcutaneous pocket was created over the second anterior rib. This was done under sterile conditions and local anesthesia. 1% lidocaine with epinephrine was utilized for this. A 2.5 cm incision was made in the skin. Blunt dissection was performed to create a subcutaneous pocket over the right pectoralis major muscle. The pocket was flushed with saline vigorously. There was adequate hemostasis. The port catheter was assembled and checked for leakage. The port catheter was secured in the pocket with two retention sutures. The tubing was tunneled subcutaneously to the right venotomy site and inserted into the SVC/RA junction through a valved peel-away sheath. Position was confirmed with fluoroscopy. Images were obtained for documentation. The patient tolerated the procedure well. No immediate complications. Incisions were closed in a two layer fashion with 4 - 0 Vicryl suture. Dermabond was applied to the skin. The port catheter  was accessed, blood was aspirated followed by saline and heparin flushes. Needle was removed. A dry sterile dressing was applied. IMPRESSION: Ultrasound and fluoroscopically guided right internal jugular single lumen power port catheter insertion. Tip in the SVC/RA junction. Catheter ready for use. Electronically Signed   By: Jerilynn Mages.  Shick M.D.   On: 08/20/2016 13:53       Subjective: Feels the pain in her left leg since admission is better although still present.  Discharge Exam: Vitals:   09/11/16 0100 09/11/16 0242  BP:  130/80 (!) 110/59  Pulse: (!) 112 99  Resp: (!) 24 15  Temp:  99.6 F (37.6 C)   Vitals:   09/11/16 0000 09/11/16 0030 09/11/16 0100 09/11/16 0242  BP: 130/82 133/88 130/80 (!) 110/59  Pulse: (!) 117 (!) 110 (!) 112 99  Resp: (!) 26 (!) 28 (!) 24 15  Temp:    99.6 F (37.6 C)  TempSrc:    Oral  SpO2: 95% 94% 94% 97%  Weight:    60.2 kg (132 lb 11.2 oz)  Height:        General: Pt is alert, awake, not in acute distress Cardiovascular: RRR, S1/S2 +, no rubs, no gallops Respiratory: CTA bilaterally, no wheezing, no rhonchi Abdominal: Soft, NT, ND, bowel sounds + Extremities: no edema, no cyanosis    The results of significant diagnostics from this hospitalization (including imaging, microbiology, ancillary and laboratory) are listed below for reference.     Microbiology: No results found for this or any previous visit (from the past 240 hour(s)).   Labs: BNP (last 3 results)  Recent Labs  08/28/16 1600  BNP 32.4   Basic Metabolic Panel:  Recent Labs Lab 09/10/16 1952 09/11/16 0702  NA 138 139  K 3.3* 3.8  CL 102 107  CO2 24 21*  GLUCOSE 101* 98  BUN 14 13  CREATININE 0.55 0.56  CALCIUM 9.5 9.1   Liver Function Tests:  Recent Labs Lab 09/10/16 1952 09/11/16 0702  AST 38 36  ALT 35 32  ALKPHOS 186* 177*  BILITOT 0.7 0.6  PROT 6.4* 5.5*  ALBUMIN 1.9* 1.6*   No results for input(s): LIPASE, AMYLASE in the last 168 hours. No results for input(s): AMMONIA in the last 168 hours. CBC:  Recent Labs Lab 09/05/16 0526 09/10/16 1952 09/11/16 0702  WBC 15.7* 31.7* 33.7*  NEUTROABS  --  28.5*  --   HGB 8.4* 8.0* 7.9*  HCT 26.0* 24.5* 24.7*  MCV 86.4 90.1 91.5  PLT 70* 203 232   Cardiac Enzymes: No results for input(s): CKTOTAL, CKMB, CKMBINDEX, TROPONINI in the last 168 hours. BNP: Invalid input(s): POCBNP CBG:  Recent Labs Lab 09/05/16 1110 09/05/16 1616 09/05/16 2120 09/06/16 0704 09/06/16 1056  GLUCAP 103* 100* 95 81 71    D-Dimer No results for input(s): DDIMER in the last 72 hours. Hgb A1c No results for input(s): HGBA1C in the last 72 hours. Lipid Profile No results for input(s): CHOL, HDL, LDLCALC, TRIG, CHOLHDL, LDLDIRECT in the last 72 hours. Thyroid function studies No results for input(s): TSH, T4TOTAL, T3FREE, THYROIDAB in the last 72 hours.  Invalid input(s): FREET3 Anemia work up No results for input(s): VITAMINB12, FOLATE, FERRITIN, TIBC, IRON, RETICCTPCT in the last 72 hours. Urinalysis    Component Value Date/Time   COLORURINE STRAW (A) 07/14/2016 1856   APPEARANCEUR CLEAR 07/14/2016 1856   LABSPEC 1.020 07/14/2016 1856   PHURINE 7.0 07/14/2016 1856   GLUCOSEU NEGATIVE 07/14/2016 1856   HGBUR NEGATIVE 07/14/2016  [place] 07/14/2016 1856   KETONESUR NEGATIVE 07/14/2016 1856   PROTEINUR NEGATIVE 07/14/2016 1856   NITRITE NEGATIVE 07/14/2016 1856   LEUKOCYTESUR NEGATIVE 07/14/2016 1856   Sepsis Labs Invalid input(s): PROCALCITONIN,  WBC,  LACTICIDVEN Microbiology No results found for this or any previous visit (from the past 240 hour(s)).   Time coordinating discharge: Over 30 minutes  SIGNED:   Kathie Dike, MD  [facility] 09/11/2016, 2:37 PM Pager   If 7PM-7AM, please contact night-coverage [email or web address] Password [email or web address]

## 2016-09-12 ENCOUNTER — Telehealth: Payer: Self-pay | Admitting: Radiation Therapy

## 2016-09-12 NOTE — Telephone Encounter (Signed)
I received a message from [facility], requesting an appointment for his wife, Heather Hinton, colan get back in for treatment ASAP. It appears that she missed her last visit with Dr. Alvy Bimler due to a hospital admission for leg pain.   I called Dr. Calton Dach nurse line and left a message requesting a call back about this patient stating that the family is very interested in getting her back in with Dr. Alvy Bimler ASAP.   Mont Dutton R.T.(R)(T) Special Procedures Navigator

## 2016-09-14 ENCOUNTER — Telehealth: Payer: Self-pay | Admitting: Hematology and Oncology

## 2016-09-14 NOTE — Telephone Encounter (Signed)
Left message re 8/7 lab/fu

## 2016-09-17 ENCOUNTER — Emergency Department (HOSPITAL_COMMUNITY): Payer: Medicaid Other

## 2016-09-17 ENCOUNTER — Inpatient Hospital Stay (HOSPITAL_COMMUNITY)
Admission: EM | Admit: 2016-09-17 | Discharge: 2016-09-18 | DRG: 180 | Disposition: A | Discharge status: Hospice | Payer: Medicaid Other | Attending: Internal Medicine | Admitting: Internal Medicine

## 2016-09-17 ENCOUNTER — Encounter (HOSPITAL_COMMUNITY): Payer: Self-pay | Admitting: Emergency Medicine

## 2016-09-17 ENCOUNTER — Telehealth: Payer: Self-pay | Admitting: Hematology and Oncology

## 2016-09-17 ENCOUNTER — Encounter: Payer: Self-pay | Admitting: *Deleted

## 2016-09-17 DIAGNOSIS — R748 Abnormal levels of other serum enzymes: Secondary | ICD-10-CM | POA: Diagnosis not present

## 2016-09-17 DIAGNOSIS — Z923 Personal history of irradiation: Secondary | ICD-10-CM

## 2016-09-17 DIAGNOSIS — C7931 Secondary malignant neoplasm of brain: Secondary | ICD-10-CM | POA: Diagnosis present

## 2016-09-17 DIAGNOSIS — R0989 Other specified symptoms and signs involving the circulatory and respiratory systems: Secondary | ICD-10-CM | POA: Clinically undetermined

## 2016-09-17 DIAGNOSIS — J910 Malignant pleural effusion: Secondary | ICD-10-CM | POA: Diagnosis present

## 2016-09-17 DIAGNOSIS — Z801 Family history of malignant neoplasm of trachea, bronchus and lung: Secondary | ICD-10-CM

## 2016-09-17 DIAGNOSIS — I248 Other forms of acute ischemic heart disease: Secondary | ICD-10-CM | POA: Diagnosis present

## 2016-09-17 DIAGNOSIS — G893 Neoplasm related pain (acute) (chronic): Secondary | ICD-10-CM | POA: Diagnosis present

## 2016-09-17 DIAGNOSIS — E785 Hyperlipidemia, unspecified: Secondary | ICD-10-CM | POA: Diagnosis present

## 2016-09-17 DIAGNOSIS — Z66 Do not resuscitate: Secondary | ICD-10-CM | POA: Diagnosis present

## 2016-09-17 DIAGNOSIS — K5903 Drug induced constipation: Secondary | ICD-10-CM | POA: Diagnosis present

## 2016-09-17 DIAGNOSIS — Z515 Encounter for palliative care: Secondary | ICD-10-CM | POA: Diagnosis present

## 2016-09-17 DIAGNOSIS — T451X5A Adverse effect of antineoplastic and immunosuppressive drugs, initial encounter: Secondary | ICD-10-CM | POA: Diagnosis present

## 2016-09-17 DIAGNOSIS — R64 Cachexia: Secondary | ICD-10-CM | POA: Diagnosis present

## 2016-09-17 DIAGNOSIS — R778 Other specified abnormalities of plasma proteins: Secondary | ICD-10-CM

## 2016-09-17 DIAGNOSIS — C3490 Malignant neoplasm of unspecified part of unspecified bronchus or lung: Principal | ICD-10-CM | POA: Diagnosis present

## 2016-09-17 DIAGNOSIS — D72829 Elevated white blood cell count, unspecified: Secondary | ICD-10-CM | POA: Diagnosis not present

## 2016-09-17 DIAGNOSIS — D6481 Anemia due to antineoplastic chemotherapy: Secondary | ICD-10-CM | POA: Diagnosis present

## 2016-09-17 DIAGNOSIS — Z888 Allergy status to other drugs, medicaments and biological substances status: Secondary | ICD-10-CM

## 2016-09-17 DIAGNOSIS — E43 Unspecified severe protein-calorie malnutrition: Secondary | ICD-10-CM | POA: Diagnosis present

## 2016-09-17 DIAGNOSIS — R0602 Shortness of breath: Secondary | ICD-10-CM | POA: Diagnosis not present

## 2016-09-17 DIAGNOSIS — R739 Hyperglycemia, unspecified: Secondary | ICD-10-CM | POA: Diagnosis present

## 2016-09-17 DIAGNOSIS — R7989 Other specified abnormal findings of blood chemistry: Secondary | ICD-10-CM | POA: Diagnosis present

## 2016-09-17 DIAGNOSIS — Z87891 Personal history of nicotine dependence: Secondary | ICD-10-CM | POA: Diagnosis not present

## 2016-09-17 DIAGNOSIS — Z6821 Body mass index (BMI) 21.0-21.9, adult: Secondary | ICD-10-CM

## 2016-09-17 DIAGNOSIS — F419 Anxiety disorder, unspecified: Secondary | ICD-10-CM | POA: Diagnosis present

## 2016-09-17 DIAGNOSIS — B370 Candidal stomatitis: Secondary | ICD-10-CM | POA: Diagnosis present

## 2016-09-17 DIAGNOSIS — D509 Iron deficiency anemia, unspecified: Secondary | ICD-10-CM | POA: Diagnosis present

## 2016-09-17 DIAGNOSIS — I10 Essential (primary) hypertension: Secondary | ICD-10-CM | POA: Diagnosis present

## 2016-09-17 DIAGNOSIS — Z452 Encounter for adjustment and management of vascular access device: Secondary | ICD-10-CM

## 2016-09-17 DIAGNOSIS — R531 Weakness: Secondary | ICD-10-CM | POA: Diagnosis not present

## 2016-09-17 DIAGNOSIS — C3492 Malignant neoplasm of unspecified part of left bronchus or lung: Secondary | ICD-10-CM | POA: Diagnosis not present

## 2016-09-17 DIAGNOSIS — C7951 Secondary malignant neoplasm of bone: Secondary | ICD-10-CM | POA: Diagnosis present

## 2016-09-17 LAB — BLOOD GAS, ARTERIAL
ACID-BASE DEFICIT: 0.7 mmol/L (ref 0.0–2.0)
Bicarbonate: 22.4 mmol/L (ref 20.0–28.0)
DRAWN BY: 441261
FIO2: 21
O2 Saturation: 94.5 %
PH ART: 7.455 — AB (ref 7.350–7.450)
Patient temperature: 98.6
pCO2 arterial: 32.3 mmHg (ref 32.0–48.0)
pO2, Arterial: 77.8 mmHg — ABNORMAL LOW (ref 83.0–108.0)

## 2016-09-17 LAB — BASIC METABOLIC PANEL
Anion gap: 13 (ref 5–15)
BUN: 25 mg/dL — AB (ref 6–20)
CALCIUM: 10.5 mg/dL — AB (ref 8.9–10.3)
CO2: 23 mmol/L (ref 22–32)
CREATININE: 0.73 mg/dL (ref 0.44–1.00)
Chloride: 104 mmol/L (ref 101–111)
GFR calc Af Amer: >60 mL/min (ref >60)
GLUCOSE: 155 mg/dL — AB (ref 65–99)
Potassium: 3.8 mmol/L (ref 3.5–5.1)
Sodium: 140 mmol/L (ref 135–145)

## 2016-09-17 LAB — HEPATIC FUNCTION PANEL
ALBUMIN: 2.1 g/dL — AB (ref 3.5–5.0)
ALT: 26 U/L (ref 14–54)
AST: 28 U/L (ref 15–41)
Alkaline Phosphatase: 187 U/L — ABNORMAL HIGH (ref 38–126)
Bilirubin, Direct: 0.1 mg/dL (ref 0.1–0.5)
Indirect Bilirubin: 0.5 mg/dL (ref 0.3–0.9)
TOTAL PROTEIN: 6.3 g/dL — AB (ref 6.5–8.1)
Total Bilirubin: 0.6 mg/dL (ref 0.3–1.2)

## 2016-09-17 LAB — CBC
HEMATOCRIT: 27.3 % — AB (ref 36.0–46.0)
HEMOGLOBIN: 8.6 g/dL — AB (ref 12.0–15.0)
MCH: 29 pg (ref 26.0–34.0)
MCHC: 31.5 g/dL (ref 30.0–36.0)
MCV: 91.9 fL (ref 78.0–100.0)
PLATELETS: 417 10*3/uL — AB (ref 150–400)
RBC: 2.97 MIL/uL — ABNORMAL LOW (ref 3.87–5.11)
RDW: 21.6 % — ABNORMAL HIGH (ref 11.5–15.5)
WBC: 46.3 10*3/uL — ABNORMAL HIGH (ref 4.0–10.5)

## 2016-09-17 LAB — CBG MONITORING, ED: GLUCOSE-CAPILLARY: 127 mg/dL — AB (ref 65–99)

## 2016-09-17 LAB — I-STAT TROPONIN, ED: Troponin i, poc: 0.34 ng/mL (ref 0.00–0.08)

## 2016-09-17 LAB — TROPONIN I: Troponin I: 0.22 ng/mL (ref <0.03)

## 2016-09-17 MED ORDER — IOPAMIDOL (ISOVUE-370) INJECTION 76%
INTRAVENOUS | Status: AC
  Filled 2016-09-17: qty 100

## 2016-09-17 MED ORDER — BISACODYL 5 MG PO TBEC: 5.0000 mg | DELAYED_RELEASE_TABLET | Freq: Every day | ORAL | Status: DC | PRN

## 2016-09-17 MED ORDER — SODIUM CHLORIDE 0.9 % IV BOLUS (SEPSIS)
1000.0000 mL | Freq: Once | INTRAVENOUS | Status: AC
  Administered 2016-09-17: 1000 mL via INTRAVENOUS

## 2016-09-17 MED ORDER — MORPHINE SULFATE ER 15 MG PO TBCR
15.0000 mg | EXTENDED_RELEASE_TABLET | Freq: Two times a day (BID) | ORAL | Status: DC
  Administered 2016-09-17: 15 mg via ORAL
  Filled 2016-09-17: qty 1

## 2016-09-17 MED ORDER — ACETAMINOPHEN 325 MG PO TABS: 650.0000 mg | ORAL_TABLET | Freq: Four times a day (QID) | ORAL | Status: DC | PRN

## 2016-09-17 MED ORDER — GABAPENTIN 100 MG PO CAPS: 100.0000 mg | ORAL_CAPSULE | Freq: Once | ORAL | Status: DC

## 2016-09-17 MED ORDER — ONDANSETRON HCL 4 MG/2ML IJ SOLN: 4.0000 mg | Freq: Four times a day (QID) | INTRAMUSCULAR | Status: DC | PRN

## 2016-09-17 MED ORDER — IOPAMIDOL (ISOVUE-370) INJECTION 76%
100.0000 mL | Freq: Once | INTRAVENOUS | Status: AC | PRN
  Administered 2016-09-17: 100 mL via INTRAVENOUS

## 2016-09-17 MED ORDER — SODIUM CHLORIDE 0.9% FLUSH
3.0000 mL | Freq: Two times a day (BID) | INTRAVENOUS | Status: DC
  Administered 2016-09-17: 3 mL via INTRAVENOUS

## 2016-09-17 MED ORDER — ACETAMINOPHEN 650 MG RE SUPP
650.0000 mg | Freq: Four times a day (QID) | RECTAL | Status: DC | PRN
Start: 2016-09-17 — End: 2016-09-18

## 2016-09-17 MED ORDER — HYDROCODONE-ACETAMINOPHEN 5-325 MG PO TABS: 1.0000 | ORAL_TABLET | ORAL | Status: DC | PRN

## 2016-09-17 MED ORDER — METOPROLOL TARTRATE 25 MG PO TABS
25.0000 mg | ORAL_TABLET | Freq: Two times a day (BID) | ORAL | Status: DC
  Administered 2016-09-17: 25 mg via ORAL
  Filled 2016-09-17: qty 1

## 2016-09-17 MED ORDER — DOCUSATE SODIUM 100 MG PO CAPS
100.0000 mg | ORAL_CAPSULE | Freq: Two times a day (BID) | ORAL | Status: DC
  Administered 2016-09-17: 100 mg via ORAL
  Filled 2016-09-17: qty 1

## 2016-09-17 MED ORDER — PROCHLORPERAZINE MALEATE 10 MG PO TABS: 10.0000 mg | ORAL_TABLET | Freq: Four times a day (QID) | ORAL | Status: DC | PRN

## 2016-09-17 MED ORDER — PANTOPRAZOLE SODIUM 40 MG PO TBEC: 40.0000 mg | DELAYED_RELEASE_TABLET | Freq: Every day | ORAL | Status: DC

## 2016-09-17 MED ORDER — ASPIRIN 81 MG PO CHEW
324.0000 mg | CHEWABLE_TABLET | Freq: Once | ORAL | Status: AC
  Administered 2016-09-17: 324 mg via ORAL
  Filled 2016-09-17: qty 4

## 2016-09-17 MED ORDER — SODIUM CHLORIDE 0.9 % IV SOLN
INTRAVENOUS | Status: DC
  Administered 2016-09-17: 23:00:00 via INTRAVENOUS

## 2016-09-17 MED ORDER — ENOXAPARIN SODIUM 40 MG/0.4ML ~~LOC~~ SOLN
40.0000 mg | SUBCUTANEOUS | Status: DC
  Administered 2016-09-17: 40 mg via SUBCUTANEOUS
  Filled 2016-09-17: qty 0.4

## 2016-09-17 MED ORDER — SENNOSIDES-DOCUSATE SODIUM 8.6-50 MG PO TABS: 1.0000 | ORAL_TABLET | Freq: Every evening | ORAL | Status: DC | PRN

## 2016-09-17 MED ORDER — ONDANSETRON HCL 4 MG PO TABS: 4.0000 mg | ORAL_TABLET | Freq: Four times a day (QID) | ORAL | Status: DC | PRN

## 2016-09-17 MED ORDER — LORAZEPAM 2 MG/ML IJ SOLN
1.0000 mg | Freq: Once | INTRAMUSCULAR | Status: AC
  Administered 2016-09-17: 1 mg via INTRAVENOUS
  Filled 2016-09-17: qty 1

## 2016-09-17 MED ORDER — LORAZEPAM 1 MG PO TABS
1.0000 mg | ORAL_TABLET | Freq: Three times a day (TID) | ORAL | Status: DC
  Administered 2016-09-17: 1 mg via ORAL
  Filled 2016-09-17: qty 1

## 2016-09-17 MED ORDER — NYSTATIN 100000 UNIT/ML MT SUSP: 5.0000 mL | Freq: Four times a day (QID) | OROMUCOSAL | Status: DC

## 2016-09-17 MED ORDER — NYSTATIN 100000 UNIT/ML MT SUSP
5.0000 mL | Freq: Four times a day (QID) | OROMUCOSAL | Status: DC
  Administered 2016-09-17: 500000 [IU] via ORAL
  Filled 2016-09-17: qty 5

## 2016-09-17 MED ORDER — FLEET ENEMA 7-19 GM/118ML RE ENEM: 1.0000 | ENEMA | Freq: Once | RECTAL | Status: DC | PRN

## 2016-09-17 NOTE — ED Provider Notes (Signed)
Funky-looking EKG man [facility] Provider Note   CSN: [id number] Arrival date & time: 09/17/16  1509     History   Chief Complaint Chief Complaint  Patient presents with  . Shortness of Breath  . Tachycardia    HPI Heather Hinton is a 56 y.o. female.  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 The patient states that for the last 2-3 days, she has had generalized fatigue. She has had progressively worsening shortness of breath and severe dyspnea on exertion. She has also been having night sweats, fatigue, and chills. She has had a poor appetite over this time period with persistent nausea. She was being visited by family today when she became acutely more short of breath approximately one hour ago. She states she feels extremely short of breath now, even at rest. Denies any overt chest pain. Denies any overt abdominal pain as well, only nausea. No diarrhea. Denies any known fevers. She is currently living in a facility. She was last admitted several weeks ago for intractable pain.  Past Medical History:  Diagnosis Date  . B12 deficiency anemia 07/30/2016  . History of radiation therapy 07/17/16 - 07/30/16 (Right Lung and Left Pelvis), 08/03/16 ([place] [facility]  . Hyperlipidemia   . Hypertension   . Iron deficiency anemia   . Lung cancer ([place])   . Metastasis to bone ([place])   . Metastasis to brain ([place])   . Severe protein-calorie malnutrition ([place]) 08/23/2016  . Spinal stenosis     Patient Active Problem List   Diagnosis Date Noted  . SOB (shortness of breath) 09/17/2016  . Leg pain 09/11/2016  . Palliative care encounter   . Acute respiratory distress   . Pressure injury of skin 09/01/2016  . Lower GI bleed 08/29/2016  . SIRS (systemic inflammatory response syndrome) ([place]) 08/28/2016  . Severe protein-calorie malnutrition ([place]) 08/23/2016  . Goals of care, counseling/discussion 07/31/2016  . B12  deficiency anemia 07/30/2016  . Iron deficiency anemia   . Metastasis to brain ([place])   . 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 Metastasis to bone ([place])   . Left hip pain   . Palliative care by specialist   . Metastatic lung cancer (metastasis from lung to other site) ([place]) 07/14/2016  . Essential hypertension 07/14/2016  . Hypokalemia 07/14/2016  . Hyperglycemia 07/14/2016  . Antineoplastic chemotherapy induced pancytopenia ([place]) 07/14/2016    Past Surgical History:  Procedure Laterality Date  . IR FLUORO GUIDE PORT INSERTION RIGHT  08/20/2016  . IR US GUIDE VASC ACCESS RIGHT  08/20/2016  . TUBAL LIGATION      OB History    Gravida Para Term Preterm AB Living   9 8           SAB TAB Ectopic Multiple Live Births                   Home Medications    Prior to Admission medications   Medication Sig Start Date End Date Taking? Authorizing Provider  dexamethasone (DECADRON) 4 MG tablet Take 1 tab two times a day the day before [place] chemo. Take 2 tabs two times a day starting the day after chemo for 3 days. 08/21/16  Yes Gorsuch, Ni, MD  docusate sodium (COLACE) 100 MG capsule Take 100 mg by mouth daily.   Yes [provider]  HYDROcodone-acetaminophen (NORCO/VICODIN) 5-325 MG tablet Take 1-2 tablets by mouth every 4 (four) hours  as needed for moderate pain or severe pain. 09/11/16  Yes Kathie Dike, MD  lidocaine-prilocaine (EMLA) cream Apply 1 application topically as needed. 07/30/16  Yes Gorsuch, Ni, MD  metoprolol tartrate (LOPRESSOR) 25 MG tablet Take 1 tablet (25 mg total) by mouth 2 (two) times daily. 09/05/16  Yes Isaac Bliss, Rayford Halsted, MD  morphine (MS CONTIN) 15 MG 12 hr tablet Take 1 tablet (15 mg total) by mouth every 12 (twelve) hours. 09/05/16  Yes Isaac Bliss, Rayford Halsted, MD  Multiple Vitamin (MULTIVITAMIN WITH MINERALS) TABS tablet Take 1 tablet by mouth daily.   Yes [provider]  ondansetron (ZOFRAN) 8 MG tablet Take 1 tablet (8 mg total)  by mouth 2 (two) times daily as needed for refractory nausea / vomiting. Start on day 3 after chemo. 08/21/16  Yes Gorsuch, Ni, MD  pantoprazole (PROTONIX) 40 MG tablet Take 1 tablet (40 mg total) by mouth daily. 08/17/16  Yes Gorsuch, Ni, MD  polyethylene glycol (MIRALAX / GLYCOLAX) packet Take 17 g by mouth daily. Patient taking differently: Take 17 g by mouth daily as needed for mild constipation. Every other day 07/24/16  Yes Theodis Blaze, MD  prochlorperazine (COMPAZINE) 10 MG tablet Take 1 tablet (10 mg total) by mouth every 6 (six) hours as needed (Nausea or vomiting). 08/21/16  Yes Heath Lark, MD    Family History Family History  Problem Relation Age of Onset  . Hypertension Mother   . Diabetes Mother   . Breast cancer Mother   . Hypertension Father   . Hypertension Sister   . Pancreatic cancer Sister   . Lung cancer Maternal Grandmother     Social History Social History  Substance Use Topics  . Smoking status: Former Smoker    Packs/day: 0.50    Years: 40.00    Types: Cigarettes    Quit date: 10/2015  . Smokeless tobacco: Never Used  . Alcohol use No     Allergies   Iron   Review of Systems Review of Systems  Constitutional: Positive for appetite change and fatigue.  Respiratory: Positive for cough and shortness of breath.   Neurological: Positive for weakness and light-headedness.  All other systems reviewed and are negative.    Physical Exam Updated Vital Signs BP 135/71 (BP Location: Left Arm)   Pulse (!) 122   Temp 97.6 F (36.4 C) (Oral)   Resp 20   SpO2 97%   Physical Exam  Constitutional: She is oriented to person, place, and time. She appears well-developed and well-nourished. She appears distressed.  HENT:  Head: Normocephalic and atraumatic.  Mouth/Throat: Oropharynx is clear and moist.  Eyes: Conjunctivae are normal.  Neck: Neck supple.  Cardiovascular: Regular rhythm and normal heart sounds.  Tachycardia present.  Exam reveals no  friction rub.   No murmur heard. Pulmonary/Chest: Effort normal. Tachypnea noted. No respiratory distress. She has decreased breath sounds in the right lower field and the left lower field. She has no wheezes. She has no rales.  Abdominal: She exhibits no distension.  Musculoskeletal: She exhibits no edema.  Neurological: She is alert and oriented to person, place, and time. She exhibits normal muscle tone.  Skin: Skin is warm. Capillary refill takes less than 2 seconds. She is diaphoretic.  Psychiatric: She has a normal mood and affect.  Nursing note and vitals reviewed.    ED Treatments / Results  Labs (all labs ordered are listed, but only abnormal results are displayed) Labs Reviewed  BASIC METABOLIC PANEL -  Abnormal; Notable for the following:       Result Value   Glucose, Bld 155 (*)    BUN 25 (*)    Calcium 10.5 (*)    All other components within normal limits  CBC - Abnormal; Notable for the following:    WBC 46.3 (*)    RBC 2.97 (*)    Hemoglobin 8.6 (*)    HCT 27.3 (*)    RDW 21.6 (*)    Platelets 417 (*)    All other components within normal limits  HEPATIC FUNCTION PANEL - Abnormal; Notable for the following:    Total Protein 6.3 (*)    Albumin 2.1 (*)    Alkaline Phosphatase 187 (*)    All other components within normal limits  BLOOD GAS, ARTERIAL - Abnormal; Notable for the following:    pH, Arterial 7.455 (*)    pO2, Arterial 77.8 (*)    All other components within normal limits  TROPONIN I - Abnormal; Notable for the following:    Troponin I 0.22 (*)    All other components within normal limits  I-STAT TROPONIN, ED - Abnormal; Notable for the following:    Troponin i, poc 0.34 (*)    All other components within normal limits  CBG MONITORING, ED - Abnormal; Notable for the following:    Glucose-Capillary 127 (*)    All other components within normal limits  CULTURE, BLOOD (ROUTINE X 2)  CULTURE, BLOOD (ROUTINE X 2)  BRAIN NATRIURETIC PEPTIDE  BASIC  METABOLIC PANEL  CBC  TROPONIN I  TROPONIN I    EKG  EKG Interpretation  Date/Time:  Monday September 17 2016 15:28:40 EDT Ventricular Rate:  139 PR Interval:    QRS Duration: 132 QT Interval:  328 QTC Calculation: 499 R Axis:   24 Text Interpretation:  Sinus tachycardia Left bundle branch block No significant change since last tracing Confirmed by Duffy Bruce [phone]) on 09/17/2016 3:44:41 PM       Radiology Dg Chest 2 View  Result Date: 09/17/2016 CLINICAL DATA:  56 y/o F; shortness of breath, weakness, tachycardia, and diaphoresis. EXAM: CHEST  2 VIEW COMPARISON:  09/10/2016 chest radiograph FINDINGS: Stable cardiac silhouette given projection and technique. Stable wide mediastinum corresponding to known adenopathy. Right central venous catheter tip projects over lower SVC. No focal consolidation, effusion, or pneumothorax. Renal contrast excretion noted. Bones are unremarkable. IMPRESSION: No acute pulmonary process identified.  Mediastinal adenopathy. Electronically Signed   By: Kristine Garbe M.D.   On: 09/17/2016 16:47   Ct Angio Chest Pe W And/or Wo Contrast  Result Date: 09/17/2016 CLINICAL DATA:  Shortness of breath and nausea with cough, history of lung carcinoma EXAM: CT ANGIOGRAPHY CHEST WITH CONTRAST TECHNIQUE: Multidetector CT imaging of the chest was performed using the standard protocol during bolus administration of intravenous contrast. Multiplanar CT image reconstructions and MIPs were obtained to evaluate the vascular anatomy. CONTRAST:  100 mL Isovue 370. COMPARISON:  08/28/2016 FINDINGS: Cardiovascular: Mild aortic calcifications are noted without aneurysmal dilatation. No cardiac enlargement is seen. The pulmonary artery demonstrates a normal branching pattern. No filling defect to suggest pulmonary embolism is identified. Mediastinum/Nodes: The thoracic inlet is within normal limits. The area of previous right supraclavicular mass is not well evaluated on  this exam. Right peritracheal and precarinal as well as subcarinal adenopathy is again identified and stable in appearance. The adenopathy within the anterior mediastinum has regressed slightly in the interval from the prior exam now measuring approximately 14 mm  in short axis decreased from 22 mm on the prior exam. The right axillary subcutaneous lesion and left axillary adenopathy is again identified. The axillary lesion is stable. The left axillary adenopathy has increased slightly in size. Index lymph node on image number 49 of series 10 measures 15 mm increased from 13 mm on the prior study. Lungs/Pleura: Emphysematous changes of lungs are again identified. The right middle lobe lesion is stable in appearance measuring 18 mm. Moderate size right-sided pleural effusion is now seen new from the prior study. Some right lower lobe atelectatic changes are seen. The previously noted soft tissue lesion along the medial left lower lobe is again identified and stable in appearance. A new 5-6 mm nodule is noted in the left lower lobe best seen on image number 66 of series 11. Upper Abdomen: The upper abdomen demonstrates multiple soft tissue densities within the omentum which have increased in size and number when compared with the prior exam. Moderate ascites is also noted within the abdomen consistent with peritoneal involvement. The adrenal glands are again enlarged consistent with metastatic disease. No definitive hepatic metastatic disease is identified. Musculoskeletal: Multiple areas of bony metastatic disease are seen within the ribcage and thoracic spine. The most prominent of these again lies in the left half of the T9 vertebral body and appears to have progressed somewhat in the interval from the prior exam with cortical loss posteriorly. Subcutaneous nodules are again seen posteriorly consistent with metastatic disease similar to that noted on the prior exam. Review of the MIP images confirms the above  findings. IMPRESSION: No evidence of pulmonary embolism. Changes consistent with the known history of metastatic lung carcinoma. There is been some increase in right-sided pleural effusion, ascites and peritoneal involvement when compared with the prior exam. A new left lower lobe pulmonary nodule is noted Some interval reduction in adenopathy particularly in the anterior mediastinum with some progression in the left axilla. Bony metastatic disease with some progression particularly at the T9 vertebral body. Aortic Atherosclerosis (ICD10-I70.0) and Emphysema (ICD10-J43.9). Electronically Signed   By: Inez Catalina M.D.   On: 09/17/2016 16:55    Procedures Procedures (including critical care time)  Medications Ordered in ED Medications  iopamidol (ISOVUE-370) 76 % injection (not administered)  gabapentin (NEURONTIN) capsule 100 mg (not administered)  HYDROcodone-acetaminophen (NORCO/VICODIN) 5-325 MG per tablet 1-2 tablet (not administered)  metoprolol tartrate (LOPRESSOR) tablet 25 mg (25 mg Oral Given 09/17/16 2242)  morphine (MS CONTIN) 12 hr tablet 15 mg (15 mg Oral Given 09/17/16 2241)  pantoprazole (PROTONIX) EC tablet 40 mg (not administered)  prochlorperazine (COMPAZINE) tablet 10 mg (not administered)  enoxaparin (LOVENOX) injection 40 mg (40 mg Subcutaneous Given 09/17/16 2241)  acetaminophen (TYLENOL) tablet 650 mg (not administered)    Or  acetaminophen (TYLENOL) suppository 650 mg (not administered)  docusate sodium (COLACE) capsule 100 mg (100 mg Oral Given 09/17/16 2241)  ondansetron (ZOFRAN) tablet 4 mg (not administered)    Or  ondansetron (ZOFRAN) injection 4 mg (not administered)  sodium chloride flush (NS) 0.9 % injection 3 mL (3 mLs Intravenous Given 09/17/16 2200)  0.9 %  sodium chloride infusion ( Intravenous New Bag/Given 09/17/16 2242)  senna-docusate (Senokot-S) tablet 1 tablet (not administered)  bisacodyl (DULCOLAX) EC tablet 5 mg (not administered)  sodium phosphate (FLEET)  7-19 GM/118ML enema 1 enema (not administered)  nystatin (MYCOSTATIN) 100000 UNIT/ML suspension 500,000 Units (500,000 Units Oral Given 09/17/16 2241)  LORazepam (ATIVAN) tablet 1 mg (1 mg Oral Given 09/17/16 2241)  sodium chloride 0.9 % bolus 1,000 mL (0 mLs Intravenous Stopped 09/17/16 1716)  iopamidol (ISOVUE-370) 76 % injection 100 mL (100 mLs Intravenous Contrast Given 09/17/16 1623)  aspirin chewable tablet 324 mg (324 mg Oral Given 09/17/16 1706)  LORazepam (ATIVAN) injection 1 mg (1 mg Intravenous Given 09/17/16 1915)     Initial Impression / Assessment and Plan / ED Course  I have reviewed the triage vital signs and the nursing notes.  Pertinent labs & imaging results that were available during my care of the patient were reviewed by me and considered in my medical decision making (see chart for details).     56 year old female with metastatic cancer here with shortness of breath. I suspect this is multifactorial, with question and concern for possible underlying heart disease/CHF versus PE/pneumonia. CT angiogram obtained and shows no evidence of pulmonary embolism or pneumonia. She does have pleural effusions and increased metastases which could be explaining her symptoms. Otherwise, she does have an elevated troponin chest suspect is secondary to demand. EKG shows old left bundle branch with no sgarbossa criteria. I discussed with cardiology who does not feel acute intervention is needed. Patient remains tachycardic. Will give Ativan for anxiety and admit for further workup of dyspnea, elevated troponin, and tachycardia.  Final Clinical Impressions(s) / ED Diagnoses   Final diagnoses:  Shortness of breath  Elevated troponin    New Prescriptions Current Discharge Medication List       Duffy Bruce, MD 09/17/16 2333

## 2016-09-17 NOTE — ED Notes (Signed)
Bed: WA20 Expected date:  Expected time:  Means of arrival:  Comments: Augustin Coupe

## 2016-09-17 NOTE — Clinical Social Work Note (Addendum)
Clinical Social Work Assessment  Patient Details  Name: Heather Hinton MRN: [id number] Date of Birth: 07-Dec-1960  Date of referral:  09/17/16               Reason for consult:  Facility Placement                Permission sought to share information with:  Chartered certified accountant granted to share information::  Yes, Verbal Permission Granted  Name::        Agency::     Relationship::     Contact Information:     Housing/Transportation Living arrangements for the past 2 months:  [place], Single Family Home Source of Information:  Spouse, Adult Children Patient Interpreter Needed:  None Criminal Activity/Legal Involvement Pertinent to Current Situation/Hospitalization:    Significant Relationships:  Adult Children, Spouse Lives with:  Spouse Do you feel safe going back to the place where you live?  No Need for family participation in patient care:  Yes (Comment)  Care giving concerns:  Pt's family wishes pt to D/C to a SNF in order to be treated for cancer at [facility].  Pt was formerly able to walk with a walker until her first chemo treatment and afterwards was unable to ambulate effectively due to what the pt's husband call the staff's failure to provide a needed medicine via syringe.  Social Worker assessment / plan:  CSW met with pt and confirmed pt's plan to be discharged to SNF to live at discharge.  CSW provided active listening and validated pt's concerns.   CSW DEPT was given permission to complete FL-2 and send referrals out to SNF facilities via the hub per pt's request.  Pt has been living independently with prior to being admitted to [facility], but was recently D/C'd a SND, per pt's family  Employment status:    Insurance information:  Self Pay (Medicaid Pending) (Family states application is submitted) PT Recommendations:  Not assessed at this time Information / Referral to community resources:     Patient/Family's Response to care:  Patient not  alert and oriented.  Patient and pt's family agreeable to plan.  Pt's husband Rufus at ph: [phone] and pt's son Erlene Quan at ph: [phone] and pt's daughter's Tish at ph: [phone] and ph: [phone] and pt's daughter [name] ph: [phone] supportive and strongly involved in pt.'s care.  Pt.'s family pleasant and appreciated CSW intervention.  Husband wants to be part of all decision-making processes medical or regardign D/C and asked that if he can't be reached to contact is children who could reach him immediately even if he is working.  Patient/Family's Understanding of and Emotional Response to Diagnosis, Current Treatment, and Prognosis:  Still assessing  Emotional Assessment Appearance:  Appears stated age Attitude/Demeanor/Rapport:  Unable to Assess Affect (typically observed):  Unable to Assess Orientation:   (Unknown pt was unresponsive in the ED) Alcohol / Substance use:    Psych involvement (Current and /or in the community):     Discharge Needs  Concerns to be addressed:  No discharge needs identified Readmission within the last 30 days:  Yes Current discharge risk:  None Barriers to Discharge:      Claudine Mouton, LCSWA 09/17/2016, 7:29 PM

## 2016-09-17 NOTE — ED Notes (Signed)
Called nurse to give report, nurse busy with a patient, will call back

## 2016-09-17 NOTE — Progress Notes (Signed)
1230 Received a Triage call that Heather Hinton has been admitted from  [facility] ED with pain.  Her son called because she has a 1430 appointment tomorrow with Freeman Caldron, P.A. With Dr. Tammi Klippel.

## 2016-09-17 NOTE — ED Triage Notes (Signed)
Pt c/o SOB, weakness, tacchycardia. Pt extremely diaphoretic with sweat dripping from face and arms. HR 145.

## 2016-09-17 NOTE — H&P (Addendum)
History and Physical    Heather Hinton ZOX:[id number] DOB: [birth year] DOA: 09/17/2016  PCP: Neale Burly, MD Consultants:  Alvy Bimler - oncology; Tammi Klippel - rad onc [place] from[place]; [place] husband, [phone]  Chief Complaint: SOB  HPI: Heather Hinton is a 56 y.o. 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  She was having more pain all over than usual.  +diaphoresis with small movement (just sitting up).  She was very uncomfortable.  Also with c/o numbness and lack of feeling in her left leg.    +thrush.  +anorexia and dysphagia.  +RUQ pain - ?constipation.  +nausea.  Family does not think she is taking Miralax so she is not receiving bowel regimen despite opiates.  SOB since Saturday.  +cough, nonproductive.  Weight is down 21 pounds since 6/3.  She won't drink Boost but needs something for nutrition.  Knots on the back of her neck and the back of her head.    She was diagnosed with metastatic poorly differentiated carcinoma in 6/18.  Has had only 1 cancer treatment and "everything went downhill" after that.  They are trying to build up her strength so that she can resume chemo.  Her port has turned and is not working at this time.  From Dr. Calton Dach 7/10 note, the patient's insurance denied a PET CT scan.  She completed brain radiation and hip radiation.  She received 1 dose of carboplatin and Alimta. She did not receive Neulasta with chemotherapy but did receive it later when she was admitted for SIRS/possible sepsis with neutropenia.   ED Course: SOB with reassuring chest CTA.  Elevated troponin likely from demand ischemia - cardiology consulted and did not feel that acute intervention is needed.  Ongoing tachycardia.  Given Ativan for anxiety.  Review of Systems: Unable to  obtain  Ambulatory Status:  Nonambulatory at this time  Past Medical History:  Diagnosis Date  . B12 deficiency anemia 07/30/2016  . History of radiation therapy 07/17/16 - 07/30/16 (Right Lung and Left Pelvis), 08/03/16 ([place] [facility]  . Hyperlipidemia   . Hypertension   . Iron deficiency anemia   . Lung cancer ([place])   . Metastasis to bone ([place])   . Metastasis to brain ([place])   . Severe protein-calorie malnutrition ([place]) 08/23/2016  . Spinal stenosis     Past Surgical History:  Procedure Laterality Date  . IR FLUORO GUIDE PORT INSERTION RIGHT  08/20/2016  . IR US GUIDE VASC ACCESS RIGHT  08/20/2016  . TUBAL LIGATION      Social History   Social History  . Marital status: Legally Separated    Spouse name: N/A  . Number of children: 10  . Years of education: N/A   Occupational History  . Unemployed    Social History Main Topics  . Smoking status: Former Smoker    Packs/day: 0.50    Years: 40.00    Types: Cigarettes    Quit date: 10/2015  . Smokeless tobacco: Never Used  . Alcohol use No  . Drug use: No  . Sexual activity: Yes   Other Topics Concern  . Not on file   Social History Narrative   Lives with husband.    Allergies  Allergen Reactions  . Iron Nausea And Vomiting    Family History  Problem  Relation Age of Onset  . Hypertension Mother   . Diabetes Mother   . Breast cancer Mother   . Hypertension Father   . Hypertension Sister   . Pancreatic cancer Sister   . Lung cancer Maternal Grandmother     Prior to Admission medications   Medication Sig Start Date End Date Taking? Authorizing Provider  dexamethasone (DECADRON) 4 MG tablet Take 1 tab two times a day the day before Alimta chemo. Take 2 tabs two times a day starting the day after chemo for 3 days. 08/21/16  Yes Gorsuch, Ni, MD  docusate sodium (COLACE) 100 MG capsule Take 100 mg by mouth daily.   Yes [provider]  HYDROcodone-acetaminophen (NORCO/VICODIN) 5-325 MG tablet Take 1-2 tablets by  mouth every 4 (four) hours as needed for moderate pain or severe pain. 09/11/16  Yes Kathie Dike, MD  lidocaine-prilocaine (EMLA) cream Apply 1 application topically as needed. 07/30/16  Yes Gorsuch, Ni, MD  lovastatin (MEVACOR) 20 MG tablet Take 20 mg by mouth at bedtime.   Yes [provider]  metoprolol tartrate (LOPRESSOR) 25 MG tablet Take 1 tablet (25 mg total) by mouth 2 (two) times daily. 09/05/16  Yes Isaac Bliss, Rayford Halsted, MD  morphine (MS CONTIN) 15 MG 12 hr tablet Take 1 tablet (15 mg total) by mouth every 12 (twelve) hours. 09/05/16  Yes Isaac Bliss, Rayford Halsted, MD  Multiple Vitamin (MULTIVITAMIN WITH MINERALS) TABS tablet Take 1 tablet by mouth daily.   Yes [provider]  ondansetron (ZOFRAN) 8 MG tablet Take 1 tablet (8 mg total) by mouth 2 (two) times daily as needed for refractory nausea / vomiting. Start on day 3 after chemo. 08/21/16  Yes Gorsuch, Ni, MD  pantoprazole (PROTONIX) 40 MG tablet Take 1 tablet (40 mg total) by mouth daily. 08/17/16  Yes Gorsuch, Ni, MD  polyethylene glycol (MIRALAX / GLYCOLAX) packet Take 17 g by mouth daily. Patient taking differently: Take 17 g by mouth daily as needed for mild constipation. Every other day 07/24/16  Yes Theodis Blaze, MD  prochlorperazine (COMPAZINE) 10 MG tablet Take 1 tablet (10 mg total) by mouth every 6 (six) hours as needed (Nausea or vomiting). 08/21/16  Yes Gorsuch, Ni, MD  dexamethasone (DECADRON) 4 MG tablet Take 1 tablet (4 mg total) by mouth every 8 (eight) hours. Patient not taking: Reported on 09/17/2016 07/23/16   Theodis Blaze, MD    Physical Exam: Vitals:   09/17/16 1834 09/17/16 2000 09/17/16 2030 09/17/16 2215  BP: 109/74 (!) 95/59 (!) 106/59 135/71  Pulse: (!) 121 (!) 123 (!) 122   Resp: 16 (!) 21 (!) 22 20  Temp:    97.6 F (36.4 C)  TempSrc:    Oral  SpO2: 100% 98% 100% 97%     General:  Appears calm and comfortable and is NAD.  She is cachectic and appears much more ill than  when I admitted her 2 months ago.  Today, she had been given Ativan prior to my evaluation and slept through the entire exam. Eyes: normal lids, iris ENT: +thrush Neck:  no LAD, masses or thyromegaly Cardiovascular:  Tachycardia to 120, no m/r/g. No LE edema.  Respiratory:  CTA bilaterally, no w/r/r. Normal respiratory effort. Abdomen: soft, ntnd, NABS Skin:  no rash or induration seen on limited exam Musculoskeletal:  grossly normal tone BUE/BLE, good ROM, no bony abnormality Psychiatric:  Somnolent and slept throughout exam without attempt to interact Neurologic:  CN 2-12 grossly intact,  moves all extremities in coordinated fashion, sensation intact  Labs on Admission: I have personally reviewed following labs and imaging studies  CBC:  Recent Labs Lab 09/11/16 0702 09/17/16 1601  WBC 33.7* 46.3*  HGB 7.9* 8.6*  HCT 24.7* 27.3*  MCV 91.5 91.9  PLT 232 035*   Basic Metabolic Panel:  Recent Labs Lab 09/11/16 0702 09/17/16 1601  NA 139 140  K 3.8 3.8  CL 107 104  CO2 21* 23  GLUCOSE 98 155*  BUN 13 25*  CREATININE 0.56 0.73  CALCIUM 9.1 10.5*   GFR: Estimated Creatinine Clearance: 67.8 mL/min (by C-G formula based on SCr of 0.73 mg/dL). Liver Function Tests:  Recent Labs Lab 09/11/16 0702 09/17/16 1601  AST 36 28  ALT 32 26  ALKPHOS 177* 187*  BILITOT 0.6 0.6  PROT 5.5* 6.3*  ALBUMIN 1.6* 2.1*   No results for input(s): LIPASE, AMYLASE in the last 168 hours. No results for input(s): AMMONIA in the last 168 hours. Coagulation Profile: No results for input(s): INR, PROTIME in the last 168 hours. Cardiac Enzymes:  Recent Labs Lab 09/17/16 2203  TROPONINI 0.22*   BNP (last 3 results) No results for input(s): PROBNP in the last 8760 hours. HbA1C: No results for input(s): HGBA1C in the last 72 hours. CBG:  Recent Labs Lab 09/17/16 1539  GLUCAP 127*   Lipid Profile: No results for input(s): CHOL, HDL, LDLCALC, TRIG, CHOLHDL, LDLDIRECT in the last  72 hours. Thyroid Function Tests: No results for input(s): TSH, T4TOTAL, FREET4, T3FREE, THYROIDAB in the last 72 hours. Anemia Panel: No results for input(s): VITAMINB12, FOLATE, FERRITIN, TIBC, IRON, RETICCTPCT in the last 72 hours. Urine analysis:    Component Value Date/Time   COLORURINE STRAW (A) 07/14/2016 1856   APPEARANCEUR CLEAR 07/14/2016 1856   LABSPEC 1.020 07/14/2016 1856   PHURINE 7.0 07/14/2016 1856   GLUCOSEU NEGATIVE 07/14/2016 1856   HGBUR NEGATIVE 07/14/2016 1856   BILIRUBINUR NEGATIVE 07/14/2016 1856   KETONESUR NEGATIVE 07/14/2016 1856   PROTEINUR NEGATIVE 07/14/2016 1856   NITRITE NEGATIVE 07/14/2016 1856   LEUKOCYTESUR NEGATIVE 07/14/2016 1856    Creatinine Clearance: Estimated Creatinine Clearance: 67.8 mL/min (by C-G formula based on SCr of 0.73 mg/dL).  Sepsis Labs: @LABRCNTIP (procalcitonin:4,lacticidven:4) )No results found for this or any previous visit (from the past 240 hour(s)).   Radiological Exams on Admission: Dg Chest 2 View  Result Date: 09/17/2016 CLINICAL DATA:  55 y/o F; shortness of breath, weakness, tachycardia, and diaphoresis. EXAM: CHEST  2 VIEW COMPARISON:  09/10/2016 chest radiograph FINDINGS: Stable cardiac silhouette given projection and technique. Stable wide mediastinum corresponding to known adenopathy. Right central venous catheter tip projects over lower SVC. No focal consolidation, effusion, or pneumothorax. Renal contrast excretion noted. Bones are unremarkable. IMPRESSION: No acute pulmonary process identified.  Mediastinal adenopathy. Electronically Signed   By: Kristine Garbe M.D.   On: 09/17/2016 16:47   Ct Angio Chest Pe W And/or Wo Contrast  Result Date: 09/17/2016 CLINICAL DATA:  Shortness of breath and nausea with cough, history of lung carcinoma EXAM: CT ANGIOGRAPHY CHEST WITH CONTRAST TECHNIQUE: Multidetector CT imaging of the chest was performed using the standard protocol during bolus administration of  intravenous contrast. Multiplanar CT image reconstructions and MIPs were obtained to evaluate the vascular anatomy. CONTRAST:  100 mL Isovue 370. COMPARISON:  08/28/2016 FINDINGS: Cardiovascular: Mild aortic calcifications are noted without aneurysmal dilatation. No cardiac enlargement is seen. The pulmonary artery demonstrates a normal branching pattern. No filling defect to suggest pulmonary  embolism is identified. Mediastinum/Nodes: The thoracic inlet is within normal limits. The area of previous right supraclavicular mass is not well evaluated on this exam. Right peritracheal and precarinal as well as subcarinal adenopathy is again identified and stable in appearance. The adenopathy within the anterior mediastinum has regressed slightly in the interval from the prior exam now measuring approximately 14 mm in short axis decreased from 22 mm on the prior exam. The right axillary subcutaneous lesion and left axillary adenopathy is again identified. The axillary lesion is stable. The left axillary adenopathy has increased slightly in size. Index lymph node on image number 49 of series 10 measures 15 mm increased from 13 mm on the prior study. Lungs/Pleura: Emphysematous changes of lungs are again identified. The right middle lobe lesion is stable in appearance measuring 18 mm. Moderate size right-sided pleural effusion is now seen new from the prior study. Some right lower lobe atelectatic changes are seen. The previously noted soft tissue lesion along the medial left lower lobe is again identified and stable in appearance. A new 5-6 mm nodule is noted in the left lower lobe best seen on image number 66 of series 11. Upper Abdomen: The upper abdomen demonstrates multiple soft tissue densities within the omentum which have increased in size and number when compared with the prior exam. Moderate ascites is also noted within the abdomen consistent with peritoneal involvement. The adrenal glands are again enlarged  consistent with metastatic disease. No definitive hepatic metastatic disease is identified. Musculoskeletal: Multiple areas of bony metastatic disease are seen within the ribcage and thoracic spine. The most prominent of these again lies in the left half of the T9 vertebral body and appears to have progressed somewhat in the interval from the prior exam with cortical loss posteriorly. Subcutaneous nodules are again seen posteriorly consistent with metastatic disease similar to that noted on the prior exam. Review of the MIP images confirms the above findings. IMPRESSION: No evidence of pulmonary embolism. Changes consistent with the known history of metastatic lung carcinoma. There is been some increase in right-sided pleural effusion, ascites and peritoneal involvement when compared with the prior exam. A new left lower lobe pulmonary nodule is noted Some interval reduction in adenopathy particularly in the anterior mediastinum with some progression in the left axilla. Bony metastatic disease with some progression particularly at the T9 vertebral body. Aortic Atherosclerosis (ICD10-I70.0) and Emphysema (ICD10-J43.9). Electronically Signed   By: Inez Catalina M.D.   On: 09/17/2016 16:55    EKG: Independently reviewed.  Sinus tachycardia with rate 139; LBBB, NSCSLT  Assessment/Plan Principal Problem:   SOB (shortness of breath) Active Problems:   Metastatic lung cancer (metastasis from lung to other site) [facility])   Essential hypertension   Hyperglycemia   Cancer associated pain   Metastasis to bone ([facility])   Iron deficiency anemia   Metastasis to brain ([facility])   Severe protein-calorie malnutrition ([facility])   Elevated troponin   Leukocytosis   Thrush   SOB -Patient with known metastatic lung CA presenting with SOB -SOB may be related to lung cancer and/or underlying COPD -CTA negative for PE -CXR was negative for effusion but CTA does show moderate-size right pleural effusion -Patient is not hypoxic  and ABG is reassuring (7.455/32/78/22) -For now will not plan thoracentesis -Anxiety may also play a role; will add ativan  Metastatic lung CA with poorly controlled pain -Patient with a long-standing smoking history presented 2 months ago with new-onset lung CA with mets to pelvis and brain -  She had been seeing ER and PCP for persistent left hip pain that was thought to be sciatica based on abnormal spinal MRI from 1 month prior -She also had a negative CXR and blood work just 3 months prior -This is obviously a rapidly progressive malignancy which indicates a possible poor prognosis -The patient has progressed rapidly since out last visit 2 months ago - she has aged considerably and appears ill and cachectic now -She was able to complete palliative radiation for her hip and brain -She was able to tolerate only 1 round of chemotherapy and then developed neutropenic fever -She has since been admitted to SNF and she is languishing -Ideally, she will get stronger and be able to resume chemotherapy -Realistically, this chance appears to be slipping away -Imaging today shows some stability but other worsening -AP 187, prior 177 - likely from bony mets -Will request oncology consultation tomorrow -She is on MS Contin for pain control but has significant breakthrough pain; family reports an unwillingness to ask for prn medications -Would consider increasing/changing MS Contin to provide better basal relief -She also has considerable constipation - likely associated with opiates - with hemorrhoids, intermittent nausea, and intermittent pain; however, she does not take enough PO to drink her Miralax -Will change to pill form to try to improve opiate-induced constipation -Will request palliative care consult -Patient has stated that she wants to be DNR  Thrush -This is likely limiting her PO intake and worsening her nutritional status -Will treat with Nystatin PO for now  HTN -Continue  Lopressor  Elevated troponin -Troponin 0.34,0.22 - improved from 0.57 on 7/30 -Cardiology consulted by telephone and reports this is likely demand ischemia -No further evaluation is recommended at this time -Trending troponin with downtrend  Hyperglycemia -Glucose 155 -This is unlikely to be a short- or long-term factor -Will not treat or follow at this time  Malnutrition -Albumin 2.1, prior 1.6 on 7/31 -Family reports that patient is unable to drink a Boost or other supplement at this time -Instead, they request a small volume liquid or pill supplement to try to improve her nutrition  Leukocytosis -WBC 46.3, prior 16.1 on 0/96 -Uncertain etiology - possibly related to malignancy and/or Neupogen (although this was given fairly remotely) -Does not appear to be infected -Will check blood and urine cultures  Anemia -Hgb 8.6, prior 7.9 -Stable, will follow  DVT prophylaxis:  Lovenox Code Status:  DNR - confirmed with patient/family Family Communication: Husband, daughters present throughout evaluation  Disposition Plan:  Back to SNF once clinically improved - SW consult requested since family prefers a Adult nurse called: Will need oncology in AM; SW, nutrition; palliative care Admission status: Admit - It is my clinical opinion that admission to INPATIENT is reasonable and necessary because this patient will require at least 2 midnights in the hospital to treat this condition based on the medical complexity of the problems presented.  Given the aforementioned information, the predictability of an adverse outcome is felt to be significant.     Karmen Bongo MD [facility]  If 7PM-7AM, please contact night-coverage [email or web address] Password [email or web address]  09/18/2016, 1:01 AM

## 2016-09-17 NOTE — Telephone Encounter (Signed)
If she gets admitted I will see her in the hospital

## 2016-09-17 NOTE — Telephone Encounter (Signed)
Patient son called and wanted to make sure that Dr Alvy Bimler new that the patient would be admitted to the hospital at [facility] for pain.  Patient has an appointment on 8/6 tomorrow for lab and Dr as will as Ailene Ards Brunning in radiation.

## 2016-09-17 NOTE — ED Notes (Signed)
Patient transported to CT 

## 2016-09-17 NOTE — ED Notes (Signed)
ED Provider at bedside. EVALUATING PT UPON ARRIVAL TO ROOM

## 2016-09-18 ENCOUNTER — Inpatient Hospital Stay (HOSPITAL_COMMUNITY): Payer: Medicaid Other

## 2016-09-18 ENCOUNTER — Other Ambulatory Visit: Payer: Medicaid Other

## 2016-09-18 ENCOUNTER — Ambulatory Visit: Payer: Medicaid Other | Admitting: Hematology and Oncology

## 2016-09-18 ENCOUNTER — Ambulatory Visit: Admission: RE | Admit: 2016-09-18 | Payer: Medicaid Other | Source: Ambulatory Visit | Admitting: Urology

## 2016-09-18 DIAGNOSIS — D72829 Elevated white blood cell count, unspecified: Secondary | ICD-10-CM

## 2016-09-18 DIAGNOSIS — R739 Hyperglycemia, unspecified: Secondary | ICD-10-CM

## 2016-09-18 DIAGNOSIS — R7989 Other specified abnormal findings of blood chemistry: Secondary | ICD-10-CM

## 2016-09-18 DIAGNOSIS — G893 Neoplasm related pain (acute) (chronic): Secondary | ICD-10-CM

## 2016-09-18 DIAGNOSIS — R778 Other specified abnormalities of plasma proteins: Secondary | ICD-10-CM | POA: Diagnosis present

## 2016-09-18 DIAGNOSIS — B370 Candidal stomatitis: Secondary | ICD-10-CM

## 2016-09-18 DIAGNOSIS — D509 Iron deficiency anemia, unspecified: Secondary | ICD-10-CM

## 2016-09-18 DIAGNOSIS — R0602 Shortness of breath: Secondary | ICD-10-CM

## 2016-09-18 DIAGNOSIS — R748 Abnormal levels of other serum enzymes: Secondary | ICD-10-CM

## 2016-09-18 DIAGNOSIS — C7951 Secondary malignant neoplasm of bone: Secondary | ICD-10-CM

## 2016-09-18 DIAGNOSIS — E43 Unspecified severe protein-calorie malnutrition: Secondary | ICD-10-CM

## 2016-09-18 DIAGNOSIS — R0989 Other specified symptoms and signs involving the circulatory and respiratory systems: Secondary | ICD-10-CM | POA: Clinically undetermined

## 2016-09-18 DIAGNOSIS — C3492 Malignant neoplasm of unspecified part of left bronchus or lung: Secondary | ICD-10-CM

## 2016-09-18 DIAGNOSIS — C3490 Malignant neoplasm of unspecified part of unspecified bronchus or lung: Principal | ICD-10-CM

## 2016-09-18 DIAGNOSIS — R531 Weakness: Secondary | ICD-10-CM

## 2016-09-18 DIAGNOSIS — I10 Essential (primary) hypertension: Secondary | ICD-10-CM

## 2016-09-18 DIAGNOSIS — C7931 Secondary malignant neoplasm of brain: Secondary | ICD-10-CM

## 2016-09-18 LAB — CBC
HCT: 32.8 % — ABNORMAL LOW (ref 36.0–46.0)
Hemoglobin: 10.5 g/dL — ABNORMAL LOW (ref 12.0–15.0)
MCH: 29.5 pg (ref 26.0–34.0)
MCHC: 32 g/dL (ref 30.0–36.0)
MCV: 92.1 fL (ref 78.0–100.0)
PLATELETS: 402 10*3/uL — AB (ref 150–400)
RBC: 3.56 MIL/uL — AB (ref 3.87–5.11)
RDW: 21.9 % — ABNORMAL HIGH (ref 11.5–15.5)
WBC: 44.1 10*3/uL — AB (ref 4.0–10.5)

## 2016-09-18 LAB — BASIC METABOLIC PANEL
Anion gap: 17 — ABNORMAL HIGH (ref 5–15)
BUN: 24 mg/dL — ABNORMAL HIGH (ref 6–20)
CALCIUM: 10.8 mg/dL — AB (ref 8.9–10.3)
CO2: 19 mmol/L — ABNORMAL LOW (ref 22–32)
CREATININE: 0.74 mg/dL (ref 0.44–1.00)
Chloride: 105 mmol/L (ref 101–111)
GFR calc non Af Amer: >60 mL/min (ref >60)
Glucose, Bld: 98 mg/dL (ref 65–99)
Potassium: 4.2 mmol/L (ref 3.5–5.1)
SODIUM: 141 mmol/L (ref 135–145)

## 2016-09-18 LAB — BRAIN NATRIURETIC PEPTIDE: B NATRIURETIC PEPTIDE 5: 657.1 pg/mL — AB (ref 0.0–100.0)

## 2016-09-18 LAB — TROPONIN I: TROPONIN I: 0.23 ng/mL — AB (ref <0.03)

## 2016-09-18 MED ORDER — MORPHINE SULFATE (PF) 4 MG/ML IV SOLN
4.0000 mg | INTRAVENOUS | Status: DC | PRN
  Administered 2016-09-18 (×2): 4 mg via INTRAVENOUS
  Filled 2016-09-18 (×2): qty 1

## 2016-09-18 MED ORDER — MORPHINE SULFATE (PF) 4 MG/ML IV SOLN
4.0000 mg | INTRAVENOUS | Status: DC | PRN
  Administered 2016-09-18: 4 mg via INTRAVENOUS
  Filled 2016-09-18: qty 1

## 2016-09-18 MED ORDER — MORPHINE SULFATE (PF) 2 MG/ML IV SOLN
2.0000 mg | Freq: Once | INTRAVENOUS | Status: AC
  Administered 2016-09-18: 2 mg via INTRAVENOUS
  Filled 2016-09-18: qty 1

## 2016-09-18 MED ORDER — PROCHLORPERAZINE MALEATE 10 MG PO TABS: 10.0000 mg | ORAL_TABLET | Freq: Four times a day (QID) | ORAL | Status: DC | PRN

## 2016-09-18 MED ORDER — NYSTATIN 100000 UNIT/ML MT SUSP: 5.0000 mL | Freq: Four times a day (QID) | OROMUCOSAL | 0 refills | Status: AC

## 2016-09-18 NOTE — Consult Note (Signed)
[facility]  Received request from [facility] for family interest in [facility]. Chart reviewed and met with spouse at bedside to complete paperwork for transfer today. Appreciate help from [place]. Discharge summary has been faxed. RN please call report to [phone].  Thank you, Erling Conte, LCSW [phone]

## 2016-09-18 NOTE — Progress Notes (Addendum)
CSW assisting with patient discharge to residential hospice. CSW met with patient family at bedside. Patient spouse present. CSW explain role and reason for visit. CSW educated family about hospice facilities. Patient family prefers [facility]. CSW contacted Liaison [place], and provided pt. Information. She plans to meet with the family today. Family agreeable.   Physician notified to complete discharge summary. Patient will transport by [facility].  3:53PM Discharge Summary faxed to [facility].  Nurse given number for report.  [facility] called for transport ETA: 1 hour. Family notified.  No other needs identified at this time.   Kathrin Greathouse, Latanya Presser, MSW Clinical Social Worker 5E and Psychiatric Service Line [phone] 09/18/2016  3:25 PM

## 2016-09-18 NOTE — Progress Notes (Signed)
[facility] CONSULT NOTE  Patient Care Team: Neale Burly, MD as PCP - General (Internal Medicine)  CHIEF COMPLAINTS/PURPOSE OF CONSULTATION:  Progressive, metastatic lung cancer  HISTORY OF PRESENTING ILLNESS:  Heather Hinton 56 y.o. female is here admitted because of progressive weakness, anorexia, poorly controlled pain, declining performance status, on the background history of metastatic lung cancer. Summary of oncologic history as follows: Oncology History   PD-I testing negative Molecular testing: no actionable mutations     Metastatic lung cancer (metastasis from lung to other site) [facility])   07/14/2016 - 07/24/2016 Hospital Admission    Patient presented with weakness, left hip pain, fevers and chills. Initial imaging studies notable for probable primary lung malignancy with metastatic disease in the ischial tuberosity on the left with significant tumor burden as well as metastatic disease and brain.       07/14/2016 Imaging    CT chest, abdomen and pelvis showed: Right middle lobe mass lesion with associated hilar and mediastinal adenopathy and near complete right middle lobe collapse. This is consistent with a primary pulmonary neoplasm with metastatic lesions till proven otherwise. PET-CT and tissue sampling are recommended for further evaluation.  Small soft tissue nodule along the posterior costophrenic angle medially on the left. This may represent a small metastatic focus.  Bony metastatic disease involving the ischial tuberosity on the left with large significant surrounding soft tissue component consistent with metastatic disease.       07/16/2016 Pathology Results    Soft Tissue Needle Core Biopsy, left ischial - METASTATIC POORLY DIFFERENTIATED CARCINOMA, SEE COMMENT. Microscopic Comment The core biopsies show sheets of malignant cells. Immunohistochemistry is positive for cytokeratin 7. The cells are negative for cytokeratin 20, cytokeratin 5/6, p63,  NapsinA, TTF-1, MelanA, S100, and PAX-8. The immunoprofile is somewhat non-specific, but in the setting of the patient's lung mass this may represent a lung primary. Foundation One and PDL-1 testing will be ordered.      07/16/2016 Pathology Results    PD-1 testing on tissue sample was negative      07/16/2016 Procedure    Technically successful core biopsy, left ischial mass      07/16/2016 Imaging    MR brain 1. Six enhancing lesions scattered throughout the brain compatible with metastatic disease. The largest lesion involves the left occipital lobe. 2. Surrounding vasogenic edema involves the 2 largest lesions with mass effect in the left occipital left lobe evidence by effacement of the sulci      07/18/2016 -  Radiation Therapy    She received radiation treatment to her brain, chest and bone      07/18/2016 Imaging    MR brain 1. Susceptibility artifact related to iron infusion yesterday. The brain metastases identified on 07/16/2016 are stable, but their margins are less distinct due to the iron artifact. Note that there may be a small subcentimeter metastasis adjacent to the largest mass in the left occipital lobe (series 11, image 67). Stable vasogenic edema and mild intracranial mass effect. 2. No new brain metastasis identified. 3. Stable signal abnormality along the left cerebellar peduncle and left lateral cerebellum which most resembles encephalomalacia.      08/20/2016 Procedure    Ultrasound and fluoroscopically guided right internal jugular single lumen power port catheter insertion. Tip in the SVC/RA junction. Catheter ready for use.      08/21/2016 -  Chemotherapy    She is started on palliative chemo with carboplatin and Alimta      Since  chemotherapy a month ago, the patient developed significant complication.  She was hospitalized recently in the local hospital after presentation with pancytopenia and neutropenic fever. She was subsequently discharged to a skilled  nursing facility. Since then, she had progressive deterioration. Her husband stated that she has stopped eating 2 days ago She was moaning with pain intermittently prior to admission At the time of evaluation, the patient is unresponsive and appears sedated. This morning, it was noted that she has cold lower extremities bilaterally, worrisome for ischemic feet.  Further evaluation is pending  MEDICAL HISTORY:  Past Medical History:  Diagnosis Date  . B12 deficiency anemia 07/30/2016  . History of radiation therapy 07/17/16 - 07/30/16 (Right Lung and Left Pelvis), 08/03/16 ([facility]  . Hyperlipidemia   . Hypertension   . Iron deficiency anemia   . Lung cancer ([place])   . Metastasis to bone ([place])   . Metastasis to brain ([place])   . Severe protein-calorie malnutrition ([place]) 08/23/2016  . Spinal stenosis     SURGICAL HISTORY: Past Surgical History:  Procedure Laterality Date  . IR FLUORO GUIDE PORT INSERTION RIGHT  08/20/2016  . IR US GUIDE VASC ACCESS RIGHT  08/20/2016  . TUBAL LIGATION      SOCIAL HISTORY: Social History   Social History  . Marital status: Legally Separated    Spouse name: N/A  . Number of children: 25  . Years of education: N/A   Occupational History  . Unemployed    Social History Main Topics  . Smoking status: Former Smoker    Packs/day: 0.50    Years: 40.00    Types: Cigarettes    Quit date: 10/2015  . Smokeless tobacco: Never Used  . Alcohol use No  . Drug use: No  . Sexual activity: Yes   Other Topics Concern  . Not on file   Social History Narrative   Lives with husband.    FAMILY HISTORY: Family History  Problem Relation Age of Onset  . Hypertension Mother   . Diabetes Mother   . Breast cancer Mother   . Hypertension Father   . Hypertension Sister   . Pancreatic cancer Sister   . Lung cancer Maternal Grandmother     ALLERGIES:  is allergic to iron.  MEDICATIONS:  Current Facility-Administered Medications  Medication Dose Route  Frequency Provider Last Rate Last Dose  . 0.9 %  sodium chloride infusion   Intravenous Continuous Karmen Bongo, MD 75 mL/hr at 09/17/16 2242    . acetaminophen (TYLENOL) tablet 650 mg  650 mg Oral Q6H PRN Karmen Bongo, MD       Or  . acetaminophen (TYLENOL) suppository 650 mg  650 mg Rectal Q6H PRN Karmen Bongo, MD      . bisacodyl (DULCOLAX) EC tablet 5 mg  5 mg Oral Daily PRN Karmen Bongo, MD      . docusate sodium (COLACE) capsule 100 mg  100 mg Oral BID Karmen Bongo, MD   100 mg at 09/17/16 2241  . enoxaparin (LOVENOX) injection 40 mg  40 mg Subcutaneous Q24H Karmen Bongo, MD   40 mg at 09/17/16 2241  . gabapentin (NEURONTIN) capsule 100 mg  100 mg Oral Once Duffy Bruce, MD      . HYDROcodone-acetaminophen (NORCO/VICODIN) 5-325 MG per tablet 1-2 tablet  1-2 tablet Oral Q4H PRN Karmen Bongo, MD      . LORazepam (ATIVAN) tablet 1 mg  1 mg Oral TID Karmen Bongo, MD   1 mg at 09/17/16  2241  . metoprolol tartrate (LOPRESSOR) tablet 25 mg  25 mg Oral BID Karmen Bongo, MD   25 mg at 09/17/16 2242  . morphine (MS CONTIN) 12 hr tablet 15 mg  15 mg Oral Q12H Karmen Bongo, MD   15 mg at 09/17/16 2241  . morphine 4 MG/ML injection 4-6 mg  4-6 mg Intravenous Q1H PRN Rama, Christina P, MD      . nystatin (MYCOSTATIN) 100000 UNIT/ML suspension 500,000 Units  5 mL Oral QID Karmen Bongo, MD   500,000 Units at 09/17/16 2241  . ondansetron (ZOFRAN) tablet 4 mg  4 mg Oral Q6H PRN Karmen Bongo, MD       Or  . ondansetron [facility]) injection 4 mg  4 mg Intravenous Q6H PRN Karmen Bongo, MD      . pantoprazole (PROTONIX) EC tablet 40 mg  40 mg Oral Daily Karmen Bongo, MD      . prochlorperazine (COMPAZINE) tablet 10 mg  10 mg Oral Q6H PRN Karmen Bongo, MD      . senna-docusate (Senokot-S) tablet 1 tablet  1 tablet Oral QHS PRN Karmen Bongo, MD      . sodium chloride flush (NS) 0.9 % injection 3 mL  3 mL Intravenous Q12H Karmen Bongo, MD   3 mL at 09/17/16 2200   . sodium phosphate (FLEET) 7-19 GM/118ML enema 1 enema  1 enema Rectal Once PRN Karmen Bongo, MD        REVIEW OF SYSTEMS:  Unable to obtain  PHYSICAL EXAMINATION: ECOG PERFORMANCE STATUS: 4 - Bedbound  Vitals:   09/17/16 2215 09/18/16 0406  BP: 135/71 121/67  Pulse:  (!) 128  Resp: 20 20  Temp: 97.6 F (36.4 C) 97.9 F (36.6 C)   Filed Weights   09/18/16 1217  Weight: 127 lb (57.6 kg)    GENERAL: She is sleeping.  She looks thin and cachectic SKIN: Noted new subcutaneous nodule worrisome for skin metastasis EYES: Her eyes are closed  OROPHARYNX: Noted oral thrush NECK: supple, thyroid normal size, non-tender, without nodularity LYMPH:  no palpable lymphadenopathy in the cervical, axillary or inguinal LUNGS: clear to auscultation and percussion with normal breathing effort HEART: regular rate & rhythm and no murmurs and no lower extremity edema.  Noted cold peripheral extremity ABDOMEN:abdomen soft, non-tender and normal bowel sounds Musculoskeletal: Noted peripheral cyanosis PSYCH: She is not alert NEURO: Unable to assess  LABORATORY DATA:  I have reviewed the data as listed Lab Results  Component Value Date   WBC 44.1 (H) 09/18/2016   HGB 10.5 (L) 09/18/2016   HCT 32.8 (L) 09/18/2016   MCV 92.1 09/18/2016   PLT 402 (H) 09/18/2016    Recent Labs  09/10/16 1952 09/11/16 0702 09/17/16 1601 09/18/16 0351  NA 138 139 140 141  K 3.3* 3.8 3.8 4.2  CL 102 107 104 105  CO2 24 21* 23 19*  GLUCOSE 101* 98 155* 98  BUN 14 13 25* 24*  CREATININE 0.55 0.56 0.73 0.74  CALCIUM 9.5 9.1 10.5* 10.8*  GFRNONAA >60 >60 >60 >60  GFRAA >60 >60 >60 >60  PROT 6.4* 5.5* 6.3*  --   ALBUMIN 1.9* 1.6* 2.1*  --   AST 38 36 28  --   ALT 35 32 26  --   ALKPHOS 186* 177* 187*  --   BILITOT 0.7 0.6 0.6  --   BILIDIR  --   --  0.1  --   IBILI  --   --  0.5  --  RADIOGRAPHIC STUDIES: I have personally reviewed the radiological images as listed and agreed with the  findings in the report. Dg Chest 2 View  Result Date: 09/17/2016 CLINICAL DATA:  56 y/o F; shortness of breath, weakness, tachycardia, and diaphoresis. EXAM: CHEST  2 VIEW COMPARISON:  09/10/2016 chest radiograph FINDINGS: Stable cardiac silhouette given projection and technique. Stable wide mediastinum corresponding to known adenopathy. Right central venous catheter tip projects over lower SVC. No focal consolidation, effusion, or pneumothorax. Renal contrast excretion noted. Bones are unremarkable. IMPRESSION: No acute pulmonary process identified.  Mediastinal adenopathy. Electronically Signed   By: Kristine Garbe M.D.   On: 09/17/2016 16:47   Dg Chest 2 View  Result Date: 09/10/2016 CLINICAL DATA:  Left leg pain EXAM: CHEST  2 VIEW COMPARISON:  09/01/2016 FINDINGS: Mild linear/streaky opacity in the right mid lung. Possible small right pleural effusion. Left lung is clear. Heart is normal in size. Known adenopathy on the right paratracheal stripe and mediastinum is unchanged with better visualized on CT. Right chest port terminates in the lower SVC. IMPRESSION: Known adenopathy, better visualized on CT. Mild streaky right midlung opacities. Suspected small right pleural effusion. Electronically Signed   By: Julian Hy M.D.   On: 09/10/2016 21:00   Dg Chest 2 View  Result Date: 08/28/2016 CLINICAL DATA:  57 year old female with increasing shortness of breath since yesterday. History of lung cancer on chemotherapy. EXAM: CHEST  2 VIEW COMPARISON:  Chest x-ray 08/20/2016. FINDINGS: Right internal jugular power porta cath with tip terminating in the mid superior vena cava. No acute consolidative airspace disease. No pleural effusions. No evidence of pulmonary edema. Heart size is normal. Prominence of the right paratracheal soft tissues and right hilar soft tissues, similar to prior examination. Lateral projection also demonstrates a prominent anterior mediastinal mass, similar to the  prior study. IMPRESSION: 1. No radiographic evidence of acute cardiopulmonary disease. 2. Right hilar and mediastinal lymphadenopathy, similar to the prior examination. Electronically Signed   By: Vinnie Langton M.D.   On: 08/28/2016 12:56   Dg Chest 2 View  Result Date: 08/20/2016 CLINICAL DATA:  Initial evaluation for acute fever. EXAM: CHEST  2 VIEW COMPARISON:  Prior radiograph from 07/14/2016. FINDINGS: Interval placement of right-sided Port-A-Cath with tip overlying the distal SVC. Stable cardiac and mediastinal silhouettes with right hilar and mediastinal adenopathy. Lungs normally inflated. Right middle lobe mass grossly unchanged. No focal infiltrates. No pulmonary edema or pleural effusion. No pneumothorax. No acute osseus abnormality. IMPRESSION: 1. Interval placement of right-sided Port-A-Cath with tip overlying the distal SVC. 2. The No significant interval change in right middle lobe mass with associated hilar and mediastinal adenopathy. 3. No other active cardiopulmonary disease. Electronically Signed   By: Jeannine Boga M.D.   On: 08/20/2016 23:05   Ct Angio Chest Pe W And/or Wo Contrast  Result Date: 09/17/2016 CLINICAL DATA:  Shortness of breath and nausea with cough, history of lung carcinoma EXAM: CT ANGIOGRAPHY CHEST WITH CONTRAST TECHNIQUE: Multidetector CT imaging of the chest was performed using the standard protocol during bolus administration of intravenous contrast. Multiplanar CT image reconstructions and MIPs were obtained to evaluate the vascular anatomy. CONTRAST:  100 mL Isovue 370. COMPARISON:  08/28/2016 FINDINGS: Cardiovascular: Mild aortic calcifications are noted without aneurysmal dilatation. No cardiac enlargement is seen. The pulmonary artery demonstrates a normal branching pattern. No filling defect to suggest pulmonary embolism is identified. Mediastinum/Nodes: The thoracic inlet is within normal limits. The area of previous right supraclavicular mass is not  well evaluated on this exam. Right peritracheal and precarinal as well as subcarinal adenopathy is again identified and stable in appearance. The adenopathy within the anterior mediastinum has regressed slightly in the interval from the prior exam now measuring approximately 14 mm in short axis decreased from 22 mm on the prior exam. The right axillary subcutaneous lesion and left axillary adenopathy is again identified. The axillary lesion is stable. The left axillary adenopathy has increased slightly in size. Index lymph node on image number 49 of series 10 measures 15 mm increased from 13 mm on the prior study. Lungs/Pleura: Emphysematous changes of lungs are again identified. The right middle lobe lesion is stable in appearance measuring 18 mm. Moderate size right-sided pleural effusion is now seen new from the prior study. Some right lower lobe atelectatic changes are seen. The previously noted soft tissue lesion along the medial left lower lobe is again identified and stable in appearance. A new 5-6 mm nodule is noted in the left lower lobe best seen on image number 66 of series 11. Upper Abdomen: The upper abdomen demonstrates multiple soft tissue densities within the omentum which have increased in size and number when compared with the prior exam. Moderate ascites is also noted within the abdomen consistent with peritoneal involvement. The adrenal glands are again enlarged consistent with metastatic disease. No definitive hepatic metastatic disease is identified. Musculoskeletal: Multiple areas of bony metastatic disease are seen within the ribcage and thoracic spine. The most prominent of these again lies in the left half of the T9 vertebral body and appears to have progressed somewhat in the interval from the prior exam with cortical loss posteriorly. Subcutaneous nodules are again seen posteriorly consistent with metastatic disease similar to that noted on the prior exam. Review of the MIP images  confirms the above findings. IMPRESSION: No evidence of pulmonary embolism. Changes consistent with the known history of metastatic lung carcinoma. There is been some increase in right-sided pleural effusion, ascites and peritoneal involvement when compared with the prior exam. A new left lower lobe pulmonary nodule is noted Some interval reduction in adenopathy particularly in the anterior mediastinum with some progression in the left axilla. Bony metastatic disease with some progression particularly at the T9 vertebral body. Aortic Atherosclerosis (ICD10-I70.0) and Emphysema (ICD10-J43.9). Electronically Signed   By: Inez Catalina M.D.   On: 09/17/2016 16:55   Ct Angio Chest Pe W And/or Wo Contrast  Result Date: 08/28/2016 CLINICAL DATA:  History of lung carcinoma with shortness of breath, initial encounter EXAM: CT ANGIOGRAPHY CHEST WITH CONTRAST TECHNIQUE: Multidetector CT imaging of the chest was performed using the standard protocol during bolus administration of intravenous contrast. Multiplanar CT image reconstructions and MIPs were obtained to evaluate the vascular anatomy. CONTRAST:  75 mL Isovue 370. COMPARISON:  07/14/2016 FINDINGS: Cardiovascular: Thoracic aorta is well visualize without significant atherosclerotic calcifications or aneurysmal dilatation. No dissection is identified. No significant cardiac enlargement is seen. No coronary calcifications are noted. The pulmonary artery demonstrates a normal branching pattern. No definitive filling defect is identified to suggest pulmonary embolism. Mediastinum/Nodes: Soft tissue density is noted in the right supraclavicular region which measures 3.4 by 2.5 cm. This was not as well evaluated on the prior CT examination is consistent with nodal metastatic disease. Right chest wall port is now seen in satisfactory position. Considerable right paratracheal and pretracheal adenopathy is noted this demonstrates some decrease in size now measuring  approximately 4 cm in AP dimension previously measuring 5.2 cm. Additionally subcarinal and  right hilar adenopathy is noted. These are relatively stable from the prior exam. Anterior mediastinal adenopathy is also noted stable from the prior exam. The esophagus as visualized is within normal limits. Prominent lymph nodes are noted in the left axilla largest of these measures approximately 13 cm in short axis enlarged from previous exam. Subcutaneous lesion in the right axilla is noted measuring 16 mm best seen on image number 86 of series 5 which was not as well appreciated on the prior exam due to the imaging technique. Lungs/Pleura: Diffuse emphysematous changes are identified. The left lung is well aerated without focal infiltrate or sizable effusion. The previously seen soft tissue lesion along the medial aspect of the left lower lobe is again identified and appears enlarged now measuring 2.7 x 2.2 cm. It previously measured 1.8 x 1.0 cm. This is again felt to be arising from the chest wall and consistent with a focal metastatic lesion. Right middle lobe mass lesion is again identified but appears slightly smaller now measuring 1.8 cm in greatest dimension. This is decreased from 2.8 cm on the prior exam. The degree of right middle lobe collapse has improved when compared with the prior exam. The bronchial tree is now patent extending into the right middle lobe. Upper Abdomen: Few scattered hyperdensities are noted within the liver likely representing hemangiomas. There stable from the prior exam. Enlargement of both adrenal glands is now seen. The right adrenal gland measures approximately 2.9 cm in greatest dimension. The left adrenal gland measures approximately 3.0 cm in greatest dimension. Given the findings in the chest this likely represents focal metastatic disease and is new from the prior exam. Few scattered small rounded densities are noted within the omentum which may represent peritoneal metastatic  disease. Irregularity of the upper pole of the right kidney remains. Musculoskeletal: Scattered lucencies are noted within the thoracic spine as well as within the ribcage consistent with metastatic disease. This is new from the prior exam bone scan may be helpful for further evaluation as clinically indicated. The most prominent of these lesions lies in the left half of the T9 vertebral body. Better visualized on the current examination is a 3.4 cm soft tissue lesion within the subcutaneous fat just to the right of the midline in the midthoracic spine along the paraspinal musculature. A few smaller subcutaneous lesions are noted in the posterior chest wall. Review of the MIP images confirms the above findings. IMPRESSION: No evidence of pulmonary embolism. Findings consistent with the patient's known history of lung carcinoma with metastatic disease. The overall size of the right middle lobe mass lesion has decreased in the interval from the prior exam. There has been some increase in metastatic disease particularly in the thoracic spine and axillary regions bilaterally as well as in the left paraspinal region and subcutaneous tissues in the posterior chest wall. Right supraclavicular adenopathy is noted which was not as well visualized on a prior exam due to the imaging technique. Some decrease is noted within the pretracheal and peritracheal adenopathy when compared with the prior exam. Emphysema (ICD10-J43.9). Electronically Signed   By: Inez Catalina M.D.   On: 08/28/2016 14:04   Ir US Guide Vasc Access Right  Result Date: 08/20/2016 CLINICAL DATA:  Metastatic lung cancer, access for chemotherapy. EXAM: RIGHT INTERNAL JUGULAR SINGLE LUMEN POWER PORT CATHETER INSERTION Date:  7/9/20187/10/2016 1:40 pm Radiologist:  M. Daryll Brod, MD Guidance:  Ultrasound and fluoroscopic MEDICATIONS: Ancef 2 g; The antibiotic was administered within an appropriate time interval  prior to skin puncture. ANESTHESIA/SEDATION:  Versed 3.0 mg IV; Fentanyl 100 mcg IV; Moderate Sedation Time:  28 minutes The patient was continuously monitored during the procedure by the interventional radiology nurse under my direct supervision. FLUOROSCOPY TIME:  36 seconds (4 mGy) COMPLICATIONS: None immediate. CONTRAST:  None. PROCEDURE: Informed consent was obtained from the patient following explanation of the procedure, risks, benefits and alternatives. The patient understands, agrees and consents for the procedure. All questions were addressed. A time out was performed. Maximal barrier sterile technique utilized including caps, mask, sterile gowns, sterile gloves, large sterile drape, hand hygiene, and 2% chlorhexidine scrub. Under sterile conditions and local anesthesia, right internal jugular micropuncture venous access was performed. Access was performed with ultrasound. Images were obtained for documentation. A guide wire was inserted followed by a transitional dilator. This allowed insertion of a guide wire and catheter into the IVC. Measurements were obtained from the SVC / RA junction back to the right IJ venotomy site. In the right infraclavicular chest, a subcutaneous pocket was created over the second anterior rib. This was done under sterile conditions and local anesthesia. 1% lidocaine with epinephrine was utilized for this. A 2.5 cm incision was made in the skin. Blunt dissection was performed to create a subcutaneous pocket over the right pectoralis major muscle. The pocket was flushed with saline vigorously. There was adequate hemostasis. The port catheter was assembled and checked for leakage. The port catheter was secured in the pocket with two retention sutures. The tubing was tunneled subcutaneously to the right venotomy site and inserted into the SVC/RA junction through a valved peel-away sheath. Position was confirmed with fluoroscopy. Images were obtained for documentation. The patient tolerated the procedure well. No immediate  complications. Incisions were closed in a two layer fashion with 4 - 0 Vicryl suture. Dermabond was applied to the skin. The port catheter was accessed, blood was aspirated followed by saline and heparin flushes. Needle was removed. A dry sterile dressing was applied. IMPRESSION: Ultrasound and fluoroscopically guided right internal jugular single lumen power port catheter insertion. Tip in the SVC/RA junction. Catheter ready for use. Electronically Signed   By: Jerilynn Mages.  Shick M.D.   On: 08/20/2016 13:53   Dg Chest Port 1 View  Result Date: 09/01/2016 CLINICAL DATA:  Acute respiratory distress EXAM: PORTABLE CHEST 1 VIEW COMPARISON:  08/28/2016 FINDINGS: Right-sided central venous port tip overlies the SVC. Development of bilateral right greater than left ground-glass density throughout the lung fields. No large effusion. Stable cardiomediastinal silhouette with enlarged mediastinal contour corresponding to CT demonstrated mass/adenopathy. No pneumothorax. IMPRESSION: 1. Interim development of bilateral right greater than left ground-glass density which may be secondary to asymmetric edema, or diffuse infection. 2. Stable enlarged mediastinal silhouette corresponding to adenopathy demonstrated on CT Electronically Signed   By: Donavan Foil M.D.   On: 09/01/2016 21:25   Dg Hip Unilat With Pelvis Min 4 Views Left  Result Date: 09/03/2016 CLINICAL DATA:  Metastatic lung cancer weakness in the left hip EXAM: DG HIP (WITH OR WITHOUT PELVIS) 1V*L* COMPARISON:  05/03/2016 FINDINGS: SI joints are symmetric. No acute displaced fracture or malalignment. Expansile lucent lesion within the left inferior pubic ramus and extending to the acetabulum with bony destruction evident. IMPRESSION: 1. Large expansile lucent lesion within the left inferior pubic ramus and likely extending to the inferior acetabulum concerning for metastatic bone lesion given history of lung cancer. There is no obvious fracture or dislocation.  Electronically Signed   By: Madie Reno.D.  On: 09/03/2016 17:03   Dg Femur Min 2 Views Left  Result Date: 09/10/2016 CLINICAL DATA:  Left femur pain. No known injury. Lung cancer with brain and bone metastases. EXAM: LEFT FEMUR 2 VIEWS COMPARISON:  Pelvis CT dated 07/14/2016. FINDINGS: Again demonstrated is an extensive lytic lesion of the left ischium and inferior pubic ramus. The left femur has a normal appearance. Mild atheromatous arterial calcifications are noted. IMPRESSION: No significant change in extensive lytic metastatic disease involving the left ischium and inferior pubic ramus. Unremarkable femur. Electronically Signed   By: Claudie Revering M.D.   On: 09/10/2016 21:01   Ir Fluoro Guide Port Insertion Right  Result Date: 08/20/2016 CLINICAL DATA:  Metastatic lung cancer, access for chemotherapy. EXAM: RIGHT INTERNAL JUGULAR SINGLE LUMEN POWER PORT CATHETER INSERTION Date:  7/9/20187/10/2016 1:40 pm Radiologist:  M. Daryll Brod, MD Guidance:  Ultrasound and fluoroscopic MEDICATIONS: Ancef 2 g; The antibiotic was administered within an appropriate time interval prior to skin puncture. ANESTHESIA/SEDATION: Versed 3.0 mg IV; Fentanyl 100 mcg IV; Moderate Sedation Time:  28 minutes The patient was continuously monitored during the procedure by the interventional radiology nurse under my direct supervision. FLUOROSCOPY TIME:  36 seconds (4 mGy) COMPLICATIONS: None immediate. CONTRAST:  None. PROCEDURE: Informed consent was obtained from the patient following explanation of the procedure, risks, benefits and alternatives. The patient understands, agrees and consents for the procedure. All questions were addressed. A time out was performed. Maximal barrier sterile technique utilized including caps, mask, sterile gowns, sterile gloves, large sterile drape, hand hygiene, and 2% chlorhexidine scrub. Under sterile conditions and local anesthesia, right internal jugular micropuncture venous access was  performed. Access was performed with ultrasound. Images were obtained for documentation. A guide wire was inserted followed by a transitional dilator. This allowed insertion of a guide wire and catheter into the IVC. Measurements were obtained from the SVC / RA junction back to the right IJ venotomy site. In the right infraclavicular chest, a subcutaneous pocket was created over the second anterior rib. This was done under sterile conditions and local anesthesia. 1% lidocaine with epinephrine was utilized for this. A 2.5 cm incision was made in the skin. Blunt dissection was performed to create a subcutaneous pocket over the right pectoralis major muscle. The pocket was flushed with saline vigorously. There was adequate hemostasis. The port catheter was assembled and checked for leakage. The port catheter was secured in the pocket with two retention sutures. The tubing was tunneled subcutaneously to the right venotomy site and inserted into the SVC/RA junction through a valved peel-away sheath. Position was confirmed with fluoroscopy. Images were obtained for documentation. The patient tolerated the procedure well. No immediate complications. Incisions were closed in a two layer fashion with 4 - 0 Vicryl suture. Dermabond was applied to the skin. The port catheter was accessed, blood was aspirated followed by saline and heparin flushes. Needle was removed. A dry sterile dressing was applied. IMPRESSION: Ultrasound and fluoroscopically guided right internal jugular single lumen power port catheter insertion. Tip in the SVC/RA junction. Catheter ready for use. Electronically Signed   By: Jerilynn Mages.  Shick M.D.   On: 08/20/2016 13:53    ASSESSMENT & PLAN:  Diffuse metastatic cancer, progression of disease Declining performance status Abnormal CBC, likely due to bone metastasis Malignant cachexia, severe hypercalcemia Cancer pain Oral thrush  Overall, the patient had decline in performance status and progression of  disease The family including her husband agreed that we should focus on comfort care measures only  They agreed that her CODE STATUS to be DNR I recommend initiation of comfort care set and transition her to [facility] place and they agree I have discussed this with the primary service The hospitalist will arrange for comfort measures and transitioning of care to [facility] place Estimated prognosis to be less than 2 weeks I will cancel all follow-up at the outpatient cancer center I addressed all their questions and concernswas on counseling.     Heath Lark, MD 09/18/2016 1:19 PM

## 2016-09-18 NOTE — Progress Notes (Signed)
Initial Nutrition Assessment  DOCUMENTATION CODES:   Severe malnutrition in context of acute illness/injury  INTERVENTION:   Ensure Enlive po TID, each supplement provides 350 kcal and 20 grams of protein  NUTRITION DIAGNOSIS:   Malnutrition (Severe) related to chronic illness (lung/bone/brain cancer) as evidenced by percent weight loss, severe depletion of muscle mass, moderate depletion of body fat.  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 Presents this admission with SOB possibly related to lung cancer or underlying COPD.    Pt lethargic upon assessment. Unable to answer nutrition related history questions at this time. No family at bedside. Pt shows to not be eating this admission. Suspect pt has had poor appetite for a prolonged period of time. Will try to find out more information once pt is more alert.   Records show pt weighed 172 lb in March 2018. Obtained wt on bed during visit, pt weighed 127 lb. This shows a 25% wt loss from March 2018. This percentage in this time frame is significant for malnutrition.   Nutrition-Focused physical exam completed. Findings are moderate fat depletion in upper arm and thoracic/lumbar regions, severe muscle depletion in all regions assessed, and no edema.   Medications reviewed and include: NS @ 75 ml/hr Labs reviewed: CO2 19 (L) BUN 24 (H) Anion Gap 17 (H)  Diet Order:  Diet regular Room service appropriate? Yes; Fluid consistency: Thin  Skin:   (stage II PI sacrum)  Last BM:  PTA  Height:   Ht Readings from Last 1 Encounters:  09/18/16 5\' 4"  (1.626 m)    Weight:   Wt Readings from Last 1 Encounters:  09/18/16 127 lb  (57.6 kg)    Ideal Body Weight:  54.5 kg  BMI:  Body mass index is 21.8 kg/m.  Estimated Nutritional Needs:   Kcal:  1500-1700 (26-30 kcal/kg)  Protein:  85-95 grams (1.5-1.6 g/kg)  Fluid:  >1.5 L/day  EDUCATION NEEDS:   No education needs identified at this time  [place], LDN Clinical Nutrition Pager # - [phone]

## 2016-09-18 NOTE — Progress Notes (Addendum)
Per physician note discharge plan unclear, Home w/ Hospice vs. Residential. CSW following for discharge needs  Kathrin Greathouse, Latanya Presser, MSW Clinical Social Worker [address]E and [place] [phone] 09/18/2016  11:08 AM

## 2016-09-18 NOTE — Care Management Note (Signed)
Case Management Note  Patient Details  Name: Heather Hinton MRN: [id number] Date of Birth: 1960/03/28  Subjective/Objective:      Shortness of breath in pt with stage iv lung ca/elevated troponin/              Action/Plan: Date:  September 18, 2016 Chart reviewed for concurrent status and case management needs. Will continue to follow patient progress. Discharge Planning: following for needs Expected discharge date: [id number] Velva Harman, BSN, [place], [place]  Expected Discharge Date:   (unknown)               Expected Discharge Plan:  [place]  In-House Referral:  Clinical Social Work  Discharge planning Services  CM Consult  Post Acute Care Choice:    Choice offered to:     DME Arranged:    DME Agency:     HH Arranged:    [place] Agency:     Status of Service:  In process, will continue to follow  If discussed at Long Length of Stay Meetings, dates discussed:    Additional Comments:  Leeroy Cha, RN 09/18/2016, 8:55 AM

## 2016-09-18 NOTE — Progress Notes (Addendum)
Progress Note    Heather Hinton  PQZ:[id number] DOB: [birth year]  DOA: 09/17/2016 PCP: Neale Burly, MD    Brief Narrative:   Chief complaint: F/U dyspnea  Medical records reviewed and are as summarized below:  Heather Hinton is an 56 y.o. female the PMH of hypertension, hyperlipidemia, spinal stenosis, and stage IV lung cancer complicated by severe protein calorie malnutrition who was admitted 09/17/16 with chief complaint of a three-day history of worsening shortness of breath. She was initially diagnosed with metastatic poorly differentiated carcinoma 07/2016 when she presented with weakness, left hip pain, fever and chills. CT imaging done 07/14/16 showed a right middle lobe mass lesion with associated hilar and mediastinal adenopathy and near complete right middle lobe collapse. She underwent soft tissue needle core biopsy to her left ischium on 07/16/16 which showed metastatic poorly differentiated carcinoma. Subsequent MRI of the brain showed 6 enhancing lesions with surrounding vasogenic edema. On 07/18/16 she received radiation treatment to her brain, chest and bone. A Port-A-Cath was placed on 08/20/16 and she began palliative chemotherapy with carboplatin and Alimta on 08/21/16. She follows with Dr. Alvy Bimler. She had a hospital admission 08/28/16-09/05/16 for treatment of neutropenic fever/SIRS and was treated with one week of broad-spectrum antibiotics during that admission.  Assessment/Plan:   Principal Problems:   SOB (shortness of breath) in a patient with stage IV lung cancer CTA was negative for pulmonary embolism but did show a moderate right pleural effusion, likely a malignant effusion. She was not hypoxic on admission and ABG showed a pH of 7.455, PCO2 32, PO2 78. May need a therapeutic thoracentesis for symptom management if dyspnea persists.    Left pedal pulselessness  Left foot/leg cold to touch.  Unable to palpate pedal pulse or detect it with a bedside doppler.  STAT arterial  and venous dopplers ordered.  Extremely poor prognosis if she has lost blood flow to her leg, which was discussed with daughters.  Active Problems:   Metastatic lung cancer (metastasis from lung to other site) [facility]) We'll notify the patient's oncologist of her admission. She has completed palliative radiation therapy and has had one course of chemotherapy. Unfortunately, prognosis remains poor. She is a DO NOT RESUSCITATE.    Essential hypertension Controlled on Lopressor.    Hyperglycemia Mild. Given overall prognosis, would not treat unless her sugars are consistently above 180.    Cancer associated pain Continue home opiate regimen and bowel regimen to assist with opiate-induced constipation.    Iron deficiency anemia/anemia associated with chemotherapy Hemoglobin stable with no current indication to transfuse.    Severe protein-calorie malnutrition ([place]) Appears cachectic. Albumin levels have been low. Continue supplementation as tolerated.    Elevated troponin Cardiologist was consulted by telephone and felt this was most likely due to demand ischemia and did not recommend further evaluation.    Leukocytosis Marked elevation of WBC noted. Uncertain etiology, and no obvious source of infection. Follow-up blood and urine cultures.    Thrush Nystatin by mouth ordered by admitting M.D.    HIV screening Screen 07/15/16: Nonreactive   Family Communication/Anticipated D/C date and plan/Code Status   DVT prophylaxis: Lovenox ordered. Code Status: Full Code.  Family Communication: Daughter at bedside and other daughter by telephone. Disposition Plan: Unclear, home with hospice versus residential hospice.   Medical Consultants:    Oncology   Anti-Infectives:    None  Subjective:   Moaning and restless, non-verbal.  Objective:    Vitals:   09/17/16 2000 09/17/16  2030 09/17/16 2215 09/18/16 0406  BP: (!) 95/59 (!) 106/59 135/71 121/67  Pulse: (!) 123 (!) 122  (!)  128  Resp: (!) 21 (!) 22 20 20   Temp:   97.6 F (36.4 C) 97.9 F (36.6 C)  TempSrc:   Oral Oral  SpO2: 98% 100% 97% 100%    Intake/Output Summary (Last 24 hours) at 09/18/16 0839 Last data filed at 09/18/16 0600  Gross per 24 hour  Intake           1547.5 ml  Output                0 ml  Net           1547.5 ml   There were no vitals filed for this visit.  Exam: General: Restless and uncomfortable appearing, cachectic, ill appearing. Cardiovascular: Heart sounds show a tachycardic but regular rhythm. No gallops or rubs. No murmurs. No JVD. Lungs: Diminished breath sounds. No rales, rhonchi or wheezes. Abdomen: Soft, nontender, nondistended with normal active bowel sounds. No masses. No hepatosplenomegaly. Neurological: Somnolent and restless.  Skin: Cold left foot. No rashes or lesions. Extremities: Pulselessness left foot. Cold to touch.  Right is cool with faint pulse palpable. Psychiatric: Restless, non-verbal.   Data Reviewed:   I have personally reviewed following labs and imaging studies:  Labs: Labs show the following:   Basic Metabolic Panel:  Recent Labs Lab 09/17/16 1601 09/18/16 0351  NA 140 141  K 3.8 4.2  CL 104 105  CO2 23 19*  GLUCOSE 155* 98  BUN 25* 24*  CREATININE 0.73 0.74  CALCIUM 10.5* 10.8*   GFR Estimated Creatinine Clearance: 67.8 mL/min (by C-G formula based on SCr of 0.74 mg/dL). Liver Function Tests:  Recent Labs Lab 09/17/16 1601  AST 28  ALT 26  ALKPHOS 187*  BILITOT 0.6  PROT 6.3*  ALBUMIN 2.1*    CBC:  Recent Labs Lab 09/17/16 1601 09/18/16 0351  WBC 46.3* 44.1*  HGB 8.6* 10.5*  HCT 27.3* 32.8*  MCV 91.9 92.1  PLT 417* 402*   Cardiac Enzymes:  Recent Labs Lab 09/17/16 2203 09/18/16 0351  TROPONINI 0.22* 0.23*   CBG:  Recent Labs Lab 09/17/16 1539  GLUCAP 127*   Sepsis Labs:  Recent Labs Lab 09/17/16 1601 09/18/16 0351  WBC 46.3* 44.1*    Microbiology No results found for this or any  previous visit (from the past 240 hour(s)).  Procedures and diagnostic studies:  Dg Chest 2 View  Result Date: 09/17/2016 CLINICAL DATA:  56 y/o F; shortness of breath, weakness, tachycardia, and diaphoresis. EXAM: CHEST  2 VIEW COMPARISON:  09/10/2016 chest radiograph FINDINGS: Stable cardiac silhouette given projection and technique. Stable wide mediastinum corresponding to known adenopathy. Right central venous catheter tip projects over lower SVC. No focal consolidation, effusion, or pneumothorax. Renal contrast excretion noted. Bones are unremarkable. IMPRESSION: No acute pulmonary process identified.  Mediastinal adenopathy. Electronically Signed   By: Kristine Garbe M.D.   On: 09/17/2016 16:47   Ct Angio Chest Pe W And/or Wo Contrast  Result Date: 09/17/2016 CLINICAL DATA:  Shortness of breath and nausea with cough, history of lung carcinoma EXAM: CT ANGIOGRAPHY CHEST WITH CONTRAST TECHNIQUE: Multidetector CT imaging of the chest was performed using the standard protocol during bolus administration of intravenous contrast. Multiplanar CT image reconstructions and MIPs were obtained to evaluate the vascular anatomy. CONTRAST:  100 mL Isovue 370. COMPARISON:  08/28/2016 FINDINGS: Cardiovascular: Mild aortic calcifications are noted without aneurysmal  dilatation. No cardiac enlargement is seen. The pulmonary artery demonstrates a normal branching pattern. No filling defect to suggest pulmonary embolism is identified. Mediastinum/Nodes: The thoracic inlet is within normal limits. The area of previous right supraclavicular mass is not well evaluated on this exam. Right peritracheal and precarinal as well as subcarinal adenopathy is again identified and stable in appearance. The adenopathy within the anterior mediastinum has regressed slightly in the interval from the prior exam now measuring approximately 14 mm in short axis decreased from 22 mm on the prior exam. The right axillary subcutaneous  lesion and left axillary adenopathy is again identified. The axillary lesion is stable. The left axillary adenopathy has increased slightly in size. Index lymph node on image number 49 of series 10 measures 15 mm increased from 13 mm on the prior study. Lungs/Pleura: Emphysematous changes of lungs are again identified. The right middle lobe lesion is stable in appearance measuring 18 mm. Moderate size right-sided pleural effusion is now seen new from the prior study. Some right lower lobe atelectatic changes are seen. The previously noted soft tissue lesion along the medial left lower lobe is again identified and stable in appearance. A new 5-6 mm nodule is noted in the left lower lobe best seen on image number 66 of series 11. Upper Abdomen: The upper abdomen demonstrates multiple soft tissue densities within the omentum which have increased in size and number when compared with the prior exam. Moderate ascites is also noted within the abdomen consistent with peritoneal involvement. The adrenal glands are again enlarged consistent with metastatic disease. No definitive hepatic metastatic disease is identified. Musculoskeletal: Multiple areas of bony metastatic disease are seen within the ribcage and thoracic spine. The most prominent of these again lies in the left half of the T9 vertebral body and appears to have progressed somewhat in the interval from the prior exam with cortical loss posteriorly. Subcutaneous nodules are again seen posteriorly consistent with metastatic disease similar to that noted on the prior exam. Review of the MIP images confirms the above findings. IMPRESSION: No evidence of pulmonary embolism. Changes consistent with the known history of metastatic lung carcinoma. There is been some increase in right-sided pleural effusion, ascites and peritoneal involvement when compared with the prior exam. A new left lower lobe pulmonary nodule is noted Some interval reduction in adenopathy  particularly in the anterior mediastinum with some progression in the left axilla. Bony metastatic disease with some progression particularly at the T9 vertebral body. Aortic Atherosclerosis (ICD10-I70.0) and Emphysema (ICD10-J43.9). Electronically Signed   By: Inez Catalina M.D.   On: 09/17/2016 16:55    Medications:   . docusate sodium  100 mg Oral BID  . enoxaparin (LOVENOX) injection  40 mg Subcutaneous Q24H  . gabapentin  100 mg Oral Once  . LORazepam  1 mg Oral TID  . metoprolol tartrate  25 mg Oral BID  . morphine  15 mg Oral Q12H  . nystatin  5 mL Oral QID  . pantoprazole  40 mg Oral Daily  . sodium chloride flush  3 mL Intravenous Q12H   Continuous Infusions: . sodium chloride 75 mL/hr at 09/17/16 2242    Medical decision making is of high complexity and this patient is at high risk of deterioration, therefore this is a level 3 visit.  (> 4 problem points, >4 data points, high risk: Need 2 out of 3)   Problems/DDx Points   Self limited or minor (max 2)  1   Established problem, stable       1   Established problem, worsening       2   New problem, no additional W/U planned (max 1)       3   New problem, additional W/U planned        4    Data Reviewed Points   Review/order clinical lab tests       1   Review/order x-rays       1   Review/order tests (Echo, EKG, PFTs, etc)       1   Discussion of test results w/ performing MD       1   Independent review of image, tracing or specimen       2   Decision to obtain old records       1   Review and summation of old records       2    Level of risk Presenting prob Diagnostics Management   Minimal 1 self limited/minor Labs CXR EKG/EEG U/A U/S Rest Gargles Bandages Dressings   Low 2 or more self limited/minor 1 stable chronic Acute uncomplicated illness Tests (PFTS) Non-CV imaging Arterial labs Biopsies of skin OTC drugs Minor surgery-no risk PT OT IVF without additives    Moderate 1 or more chronic  illnesses w/ mild exac, progression or S/E from tx 2 or more stable chronic illnesses Undiagnosed new problem w/ uncertain prognosis Acute complicated injury  Stress tests Endoscopies with no risk factors Deep needle or incisional bx CV imaging without risk LP Thoracentesis Paracentesis Minor surgery w/ risks Elective major surgery w/ no risk (open, percutaneous or endoscopic) Prescription drugs Therapeutic nucl med IVF with additives Closed tx of fracture/dislocation    High Severe exac of chronic illness Acute or chronic illness/injury may pose a threat to life or bodily function (ARF) Change in neuro status    CV imaging w/ contrast and risk Cardio electophysiologic tests Endoscopies w/ risk Discography Elective major surgery Emergency major surgery Parenteral controlled substances Drug therapy req monitoring for toxicity DNR/de-escalation of care    MDM Prob points Data points Risk   Straightforward    <1    <1    Min   Low complexity    2    2    Low   Moderate    3    3    Mod   High Complexity    4 or more    4 or more    High      LOS: 1 day   Taiwana Willison  [facility] Pager [phone]. If unable to reach me by pager, please call my cell phone at [phone].  *Please refer to [email or web address], password [email or web address] to get updated schedule on who will round on this patient, as hospitalists switch teams weekly. If 7PM-7AM, please contact night-coverage at [email or web address], password [email or web address] for any overnight needs.  09/18/2016, 8:39 AM

## 2016-09-18 NOTE — Progress Notes (Signed)
Patient ID: Heather Hinton, female   DOB: 12/12/1960, 56 y.o.   MRN: [id number] Patient's PAC appears to be in good position on exam as well as her CT scan that she had done last night.  This has not been attempted to access and so there are no indications that her PAC is not working or needs any intervention.  If this is accessed and does not flush or return blood then let us know and we can inject this.  Ildefonso Keaney E 9:41 AM 09/18/2016

## 2016-09-18 NOTE — Progress Notes (Signed)
Patient's heart rate is been high, ranging between 125 and 135. Dr Rockne Menghini was notified this morning. Will keep monitoring.

## 2016-09-18 NOTE — Progress Notes (Signed)
**  Preliminary report by tech**  Left lower extremity venous duplex complete. There is no evidence of deep or superficial vein thrombosis involving the left lower extremity. All visualized vessels appear patent and compressible. There is no evidence of a Baker's cyst on the left. Incidental findings of significant plaque formations throughout the left lower extremity arterial system. Biphasic arterial waveforms were noted in the left superficial femoral artery. Calf arterial waveforms were undetectable.  Results were given to Dr. Rockne Menghini.  09/18/16 11:59 AM Heather Hinton RVT

## 2016-09-18 NOTE — Progress Notes (Signed)
PT Cancellation Note  Patient Details Name: Heather Hinton MRN: [id number] DOB: 12/08/1960   Cancelled Treatment:    Reason Eval/Treat Not Completed: Medical issues which prohibited therapy. Pt currently on bedrest in order set. Also MD has ordered STAT venous and arterial doppolers. Will hold PT today and check back another day. Thanks.    Weston Anna, MPT Pager: [phone]

## 2016-09-18 NOTE — Progress Notes (Signed)
Called report to the nurse who will be taking care of Heather Hinton at [facility].

## 2016-09-18 NOTE — Discharge Summary (Signed)
Physician Discharge Summary  Heather Hinton SAY:[id number] DOB: 07/16/1960 DOA: 09/17/2016  PCP: Neale Burly, MD  Admit date: 09/17/2016 Discharge date: 09/18/2016  Admitted From: [facility] Discharge disposition: Residential hospice   Recommendations for Outpatient Follow-Up:   1. The patient is being discharged to residential hospice facility.   Discharge Diagnosis:   Principal Problem:   SOB (shortness of breath) Active Problems:   Metastatic lung cancer (metastasis from lung to other site) [facility])   Essential hypertension   Hyperglycemia   Cancer associated pain   Metastasis to bone (HCC)   Iron deficiency anemia   Metastasis to brain (HCC)   Severe protein-calorie malnutrition (HCC)   Elevated troponin   Leukocytosis   Thrush   Absent pedal pulse on left  Discharge Condition: Terminal.  Diet recommendation:Comfort feeds   History of Present Illness:   Heather Hinton is an 56 y.o. female the PMH of hypertension, hyperlipidemia, spinal stenosis, and stage IV lung cancer complicated by severe protein calorie malnutrition who was admitted 09/17/16 with chief complaint of a three-day history of worsening shortness of breath. She was initially diagnosed with metastatic poorly differentiated carcinoma 07/2016 when she presented with weakness, left hip pain, fever and chills. CT imaging done 07/14/16 showed a right middle lobe mass lesion with associated hilar and mediastinal adenopathy and near complete right middle lobe collapse. She underwent soft tissue needle core biopsy to her left ischium on 07/16/16 which showed metastatic poorly differentiated carcinoma. Subsequent MRI of the brain showed 6 enhancing lesions with surrounding vasogenic edema. On 07/18/16 she received radiation treatment to her brain, chest and bone. A Port-A-Cath was placed on 08/20/16 and she began palliative chemotherapy with carboplatin and Alimta on 08/21/16. She follows with Dr. Alvy Bimler. She had a hospital  admission 08/28/16-09/05/16 for treatment of neutropenic fever/SIRS and was treated with one week of broad-spectrum antibiotics during that admission.   Hospital Course by Problem:   Principal Problems:   SOB (shortness of breath) in a patient with stage IV lung cancer CTA was negative for pulmonary embolism but did show a moderate right pleural effusion, likely a malignant effusion. She was not hypoxic on admission and ABG showed a pH of 7.455, PCO2 32, PO2 78. May need a therapeutic thoracentesis for symptom management if dyspnea persists.    Left pedal pulselessness  Left foot/leg cold to touch.  Unable to palpate pedal pulse or detect it with a bedside doppler.  STAT arterial and venous dopplers orderedAnd showed no evidence of DVT but significant abnormality of the arterial blood flow with arterial waveforms undetectable in the calf.  Extremely poor prognosis if she has lost blood flow to her leg, which was discussed with daughters.  Active Problems:   Metastatic lung cancer (metastasis from lung to other site) [facility]) We'll notify the patient's oncologist of her admission. She has completed palliative radiation therapy and has had one course of chemotherapy. Unfortunately, prognosis remains poor. She is a DO NOT RESUSCITATE.    Essential hypertension Controlled on Lopressor.    Hyperglycemia Mild. Given overall prognosis, would not treat unless her sugars are consistently above 180.    Cancer associated pain Continue home opiate regimen and bowel regimen to assist with opiate-induced constipation.    Iron deficiency anemia/anemia associated with chemotherapy Hemoglobin stable with no current indication to transfuse.    Severe protein-calorie malnutrition ([place]) Appears cachectic. Albumin levels have been low. Continue supplementation as tolerated.    Elevated troponin Cardiologist was consulted by telephone and  felt this was most likely due to demand ischemia and did not  recommend further evaluation.    Leukocytosis Marked elevation of WBC noted. Uncertain etiology, and no obvious source of infection. Follow-up blood and urine cultures.    Thrush Nystatin by mouth ordered by admitting M.D.    HIV screening Screen 07/15/16: Nonreactive    Medical Consultants:    Oncology   Discharge Exam:   Vitals:   09/17/16 2215 09/18/16 0406  BP: 135/71 121/67  Pulse:  (!) 128  Resp: 20 20  Temp: 97.6 F (36.4 C) 97.9 F (36.6 C)   Vitals:   09/17/16 2030 09/17/16 2215 09/18/16 0406 09/18/16 1217  BP: (!) 106/59 135/71 121/67   Pulse: (!) 122  (!) 128   Resp: (!) 22 20 20    Temp:  97.6 F (36.4 C) 97.9 F (36.6 C)   TempSrc:  Oral Oral   SpO2: 100% 97% 100%   Weight:    57.6 kg (127 lb)  Height:    5\' 4"  (1.626 m)   General: Restless and uncomfortable appearing, cachectic, ill appearing. Cardiovascular: Heart sounds show a tachycardic but regular rhythm. No gallops or rubs. No murmurs. No JVD. Lungs: Diminished breath sounds. No rales, rhonchi or wheezes. Abdomen: Soft, nontender, nondistended with normal active bowel sounds. No masses. No hepatosplenomegaly. Neurological: Somnolent and restless.  Skin: Cold left foot. No rashes or lesions. Extremities: Pulselessness left foot. Cold to touch.  Right is cool with faint pulse palpable. Psychiatric: Restless, non-verbal.   The results of significant diagnostics from this hospitalization (including imaging, microbiology, ancillary and laboratory) are listed below for reference.     Procedures and Diagnostic Studies:   Dg Chest 2 View  Result Date: 09/17/2016 CLINICAL DATA:  56 y/o F; shortness of breath, weakness, tachycardia, and diaphoresis. EXAM: CHEST  2 VIEW COMPARISON:  09/10/2016 chest radiograph FINDINGS: Stable cardiac silhouette given projection and technique. Stable wide mediastinum corresponding to known adenopathy. Right central venous catheter tip projects over lower SVC. No  focal consolidation, effusion, or pneumothorax. Renal contrast excretion noted. Bones are unremarkable. IMPRESSION: No acute pulmonary process identified.  Mediastinal adenopathy. Electronically Signed   By: Kristine Garbe M.D.   On: 09/17/2016 16:47   Ct Angio Chest Pe W And/or Wo Contrast  Result Date: 09/17/2016 CLINICAL DATA:  Shortness of breath and nausea with cough, history of lung carcinoma EXAM: CT ANGIOGRAPHY CHEST WITH CONTRAST TECHNIQUE: Multidetector CT imaging of the chest was performed using the standard protocol during bolus administration of intravenous contrast. Multiplanar CT image reconstructions and MIPs were obtained to evaluate the vascular anatomy. CONTRAST:  100 mL Isovue 370. COMPARISON:  08/28/2016 FINDINGS: Cardiovascular: Mild aortic calcifications are noted without aneurysmal dilatation. No cardiac enlargement is seen. The pulmonary artery demonstrates a normal branching pattern. No filling defect to suggest pulmonary embolism is identified. Mediastinum/Nodes: The thoracic inlet is within normal limits. The area of previous right supraclavicular mass is not well evaluated on this exam. Right peritracheal and precarinal as well as subcarinal adenopathy is again identified and stable in appearance. The adenopathy within the anterior mediastinum has regressed slightly in the interval from the prior exam now measuring approximately 14 mm in short axis decreased from 22 mm on the prior exam. The right axillary subcutaneous lesion and left axillary adenopathy is again identified. The axillary lesion is stable. The left axillary adenopathy has increased slightly in size. Index lymph node on image number 49 of series 10 measures 15 mm increased from  13 mm on the prior study. Lungs/Pleura: Emphysematous changes of lungs are again identified. The right middle lobe lesion is stable in appearance measuring 18 mm. Moderate size right-sided pleural effusion is now seen new from the prior  study. Some right lower lobe atelectatic changes are seen. The previously noted soft tissue lesion along the medial left lower lobe is again identified and stable in appearance. A new 5-6 mm nodule is noted in the left lower lobe best seen on image number 66 of series 11. Upper Abdomen: The upper abdomen demonstrates multiple soft tissue densities within the omentum which have increased in size and number when compared with the prior exam. Moderate ascites is also noted within the abdomen consistent with peritoneal involvement. The adrenal glands are again enlarged consistent with metastatic disease. No definitive hepatic metastatic disease is identified. Musculoskeletal: Multiple areas of bony metastatic disease are seen within the ribcage and thoracic spine. The most prominent of these again lies in the left half of the T9 vertebral body and appears to have progressed somewhat in the interval from the prior exam with cortical loss posteriorly. Subcutaneous nodules are again seen posteriorly consistent with metastatic disease similar to that noted on the prior exam. Review of the MIP images confirms the above findings. IMPRESSION: No evidence of pulmonary embolism. Changes consistent with the known history of metastatic lung carcinoma. There is been some increase in right-sided pleural effusion, ascites and peritoneal involvement when compared with the prior exam. A new left lower lobe pulmonary nodule is noted Some interval reduction in adenopathy particularly in the anterior mediastinum with some progression in the left axilla. Bony metastatic disease with some progression particularly at the T9 vertebral body. Aortic Atherosclerosis (ICD10-I70.0) and Emphysema (ICD10-J43.9). Electronically Signed   By: Inez Catalina M.D.   On: 09/17/2016 16:55   09/18/16 Left lower extremity venous duplex complete. There is no evidence of deep or superficial vein thrombosis involving the left lower extremity. All visualized  vessels appear patent and compressible. There is no evidence of a Baker's cyst on the left. Incidental findings of significant plaque formations throughout the left lower extremity arterial system. Biphasic arterial waveforms were noted in the left superficial femoral artery. Calf arterial waveforms were undetectable.  Results were given to Dr. Rockne Menghini.  Labs:   Basic Metabolic Panel:  Recent Labs Lab 09/17/16 1601 09/18/16 0351  NA 140 141  K 3.8 4.2  CL 104 105  CO2 23 19*  GLUCOSE 155* 98  BUN 25* 24*  CREATININE 0.73 0.74  CALCIUM 10.5* 10.8*   GFR Estimated Creatinine Clearance: 67.8 mL/min (by C-G formula based on SCr of 0.74 mg/dL). Liver Function Tests:  Recent Labs Lab 09/17/16 1601  AST 28  ALT 26  ALKPHOS 187*  BILITOT 0.6  PROT 6.3*  ALBUMIN 2.1*   CBC:  Recent Labs Lab 09/17/16 1601 09/18/16 0351  WBC 46.3* 44.1*  HGB 8.6* 10.5*  HCT 27.3* 32.8*  MCV 91.9 92.1  PLT 417* 402*   Cardiac Enzymes:  Recent Labs Lab 09/17/16 2203 09/18/16 0351  TROPONINI 0.22* 0.23*   CBG:  Recent Labs Lab 09/17/16 1539  GLUCAP 127*     Discharge Instructions:   Discharge Instructions    Call MD for:  persistant nausea and vomiting    Complete by:  As directed    Call MD for:  severe uncontrolled pain    Complete by:  As directed    Diet - low sodium heart healthy    Complete  by:  As directed    Increase activity slowly    Complete by:  As directed      Allergies as of 09/18/2016      Reactions   Iron Nausea And Vomiting      Medication List    STOP taking these medications   dexamethasone 4 MG tablet Commonly known as:  DECADRON     TAKE these medications   docusate sodium 100 MG capsule Commonly known as:  COLACE Take 100 mg by mouth daily.   HYDROcodone-acetaminophen 5-325 MG tablet Commonly known as:  NORCO/VICODIN Take 1-2 tablets by mouth every 4 (four) hours as needed for moderate pain or severe pain.   lidocaine-prilocaine  cream Commonly known as:  EMLA Apply 1 application topically as needed.   metoprolol tartrate 25 MG tablet Commonly known as:  LOPRESSOR Take 1 tablet (25 mg total) by mouth 2 (two) times daily.   morphine 15 MG 12 hr tablet Commonly known as:  MS CONTIN Take 1 tablet (15 mg total) by mouth every 12 (twelve) hours.   multivitamin with minerals Tabs tablet Take 1 tablet by mouth daily.   nystatin 100000 UNIT/ML suspension Commonly known as:  MYCOSTATIN Take 5 mLs (500,000 Units total) by mouth 4 (four) times daily.   ondansetron 8 MG tablet Commonly known as:  ZOFRAN Take 1 tablet (8 mg total) by mouth 2 (two) times daily as needed for refractory nausea / vomiting. Start on day 3 after chemo.   pantoprazole 40 MG tablet Commonly known as:  PROTONIX Take 1 tablet (40 mg total) by mouth daily.   polyethylene glycol packet Commonly known as:  MIRALAX / GLYCOLAX Take 17 g by mouth daily. What changed:  when to take this  reasons to take this  additional instructions   prochlorperazine 10 MG tablet Commonly known as:  COMPAZINE Take 1 tablet (10 mg total) by mouth every 6 (six) hours as needed (Nausea or vomiting).      Follow-up Information    Neale Burly, MD. Call.   Specialty:  Internal Medicine Why:  As needed. Contact information: [place] [address] 078 [phone]            Time coordinating discharge: Greater than 35 minutes.  Signed:  RAMA,CHRISTINA  Pager [phone] [facility] 09/18/2016, 3:48 PM

## 2016-09-22 LAB — CULTURE, BLOOD (ROUTINE X 2)
Culture: NO GROWTH
SPECIAL REQUESTS: ADEQUATE

## 2016-09-23 LAB — CULTURE, BLOOD (SINGLE)
CULTURE: NO GROWTH
SPECIAL REQUESTS: ADEQUATE

## 2016-10-13 DEATH — deceased

## 2016-12-28 ENCOUNTER — Encounter: Payer: Self-pay | Admitting: Radiation Therapy

## 2017-01-16 ENCOUNTER — Other Ambulatory Visit: Payer: Self-pay | Admitting: Nurse Practitioner

## 2018-04-03 IMAGING — CT CT ANGIO CHEST
2 of 6 series · 16 of 36 positions shown · IV contrast (Isovue)
Comparison: 07/14/2016

CLINICAL DATA: History of lung carcinoma with shortness of breath,
initial encounter

EXAM:
CT ANGIOGRAPHY CHEST WITH CONTRAST
TECHNIQUE: Multidetector CT imaging of the chest was performed using the
standard protocol during bolus administration of intravenous
contrast. Multiplanar CT image reconstructions and MIPs were
obtained to evaluate the vascular anatomy.
CONTRAST:  75 mL Isovue 370.

[Series 5: thins · axial · 0.67mm/px · z∈[-242,+8]mm · 15 of 278 slices shown]
[im 14/278  lung]
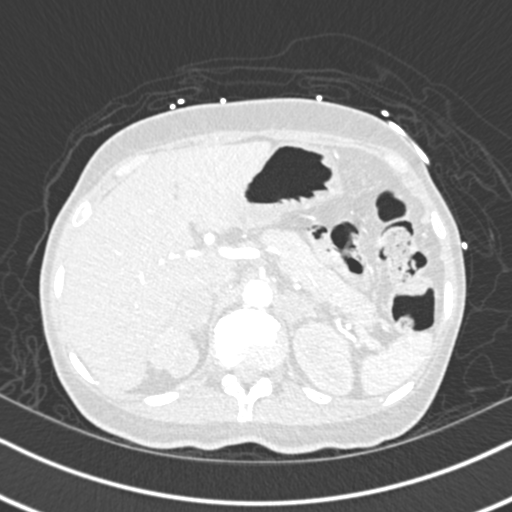
[im 28/278  mediastinal]
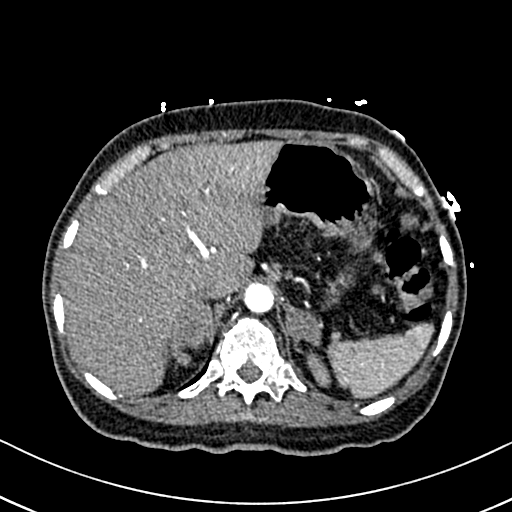
[im 56/278  lung]
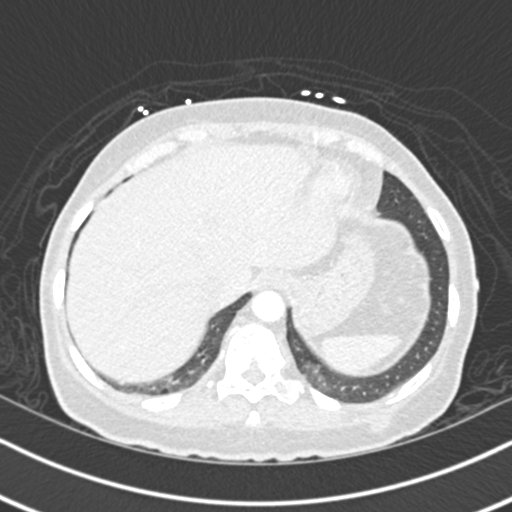
[im 70/278  mediastinal]
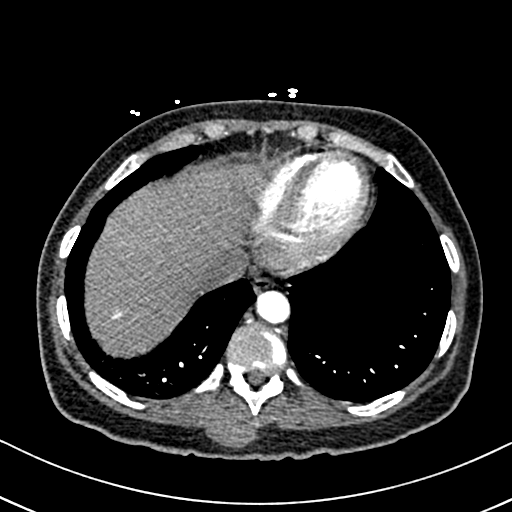
[im 84/278  lung]
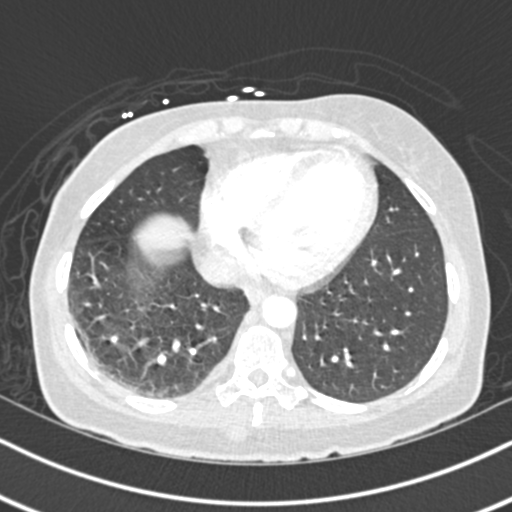
[im 97/278  mediastinal]
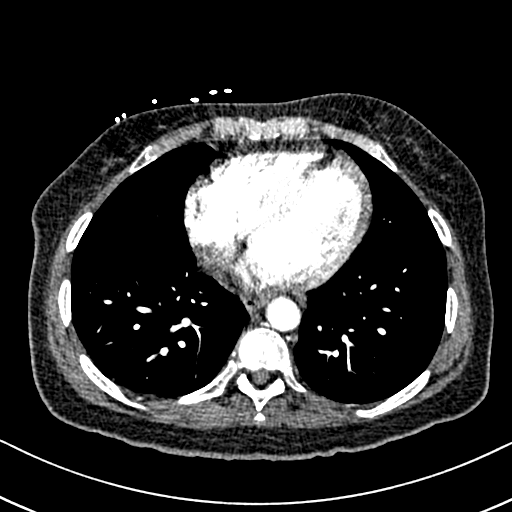
[im 125/278  lung]
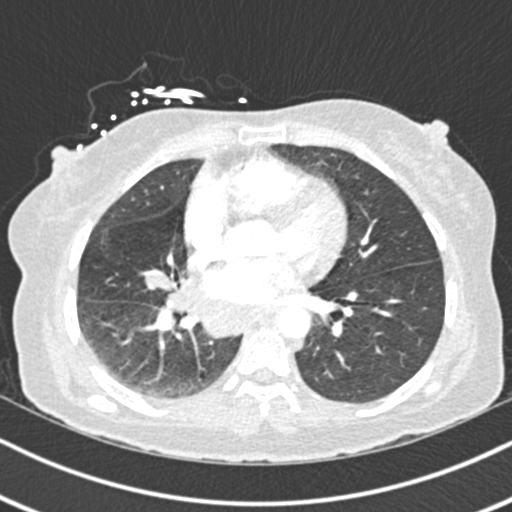
[im 139/278  mediastinal]
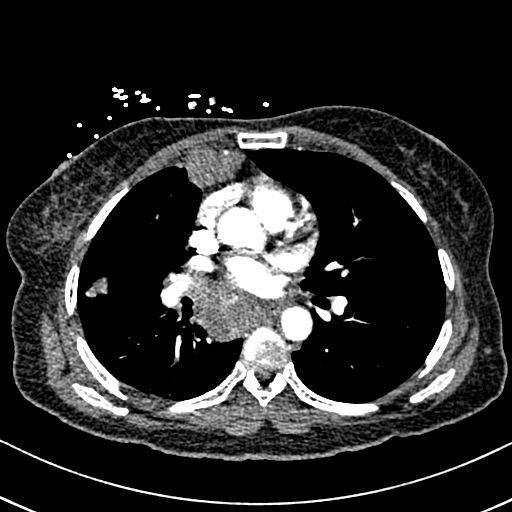
[im 153/278  lung]
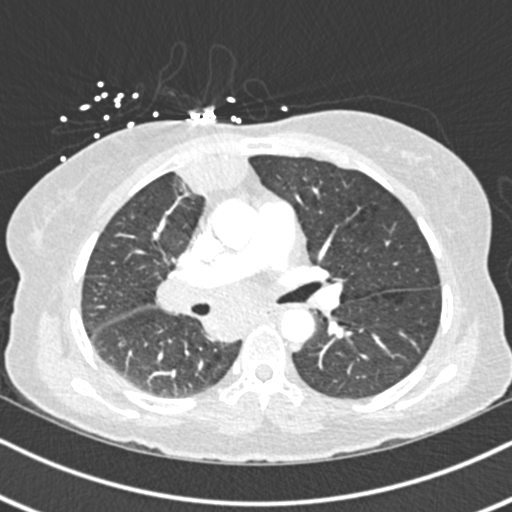
[im 181/278  mediastinal]
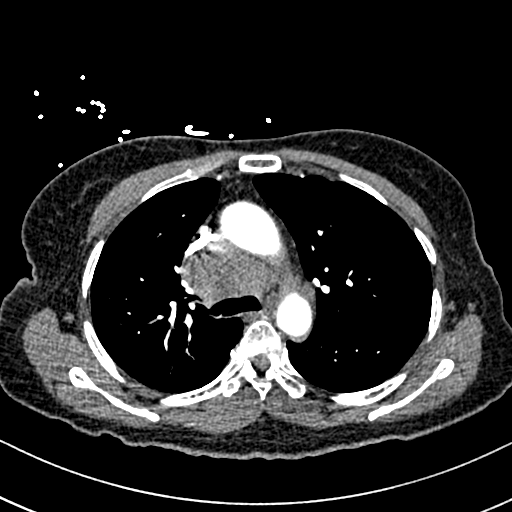
[im 194/278  lung]
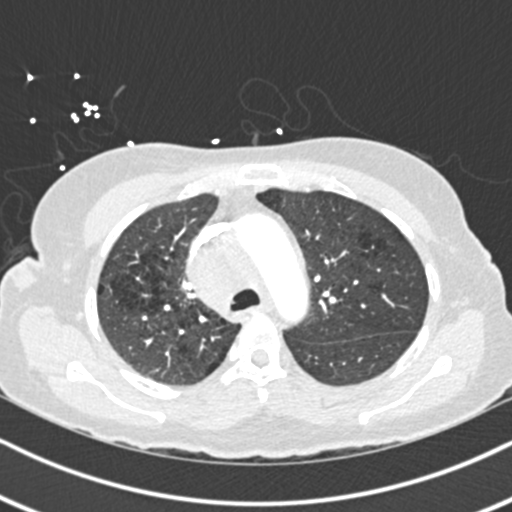
[im 208/278  mediastinal]
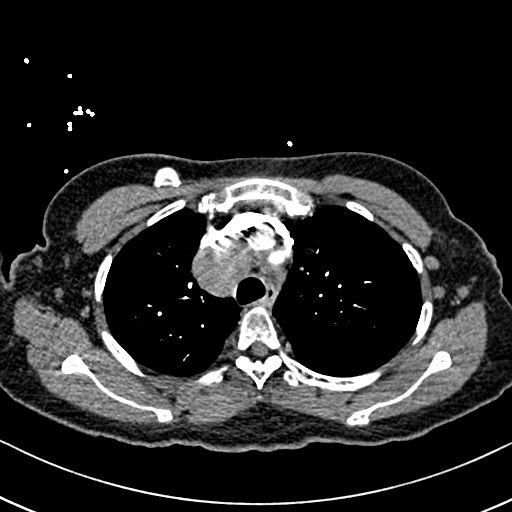
[im 222/278  lung]
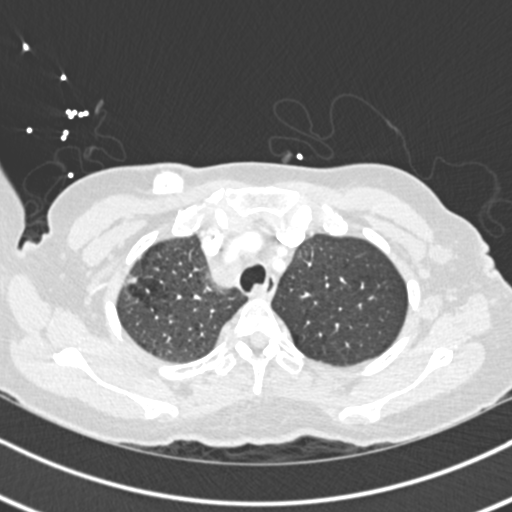
[im 250/278  mediastinal]
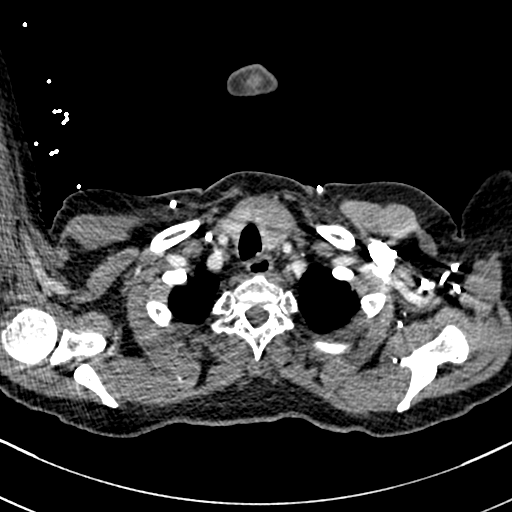
[im 264/278  lung]
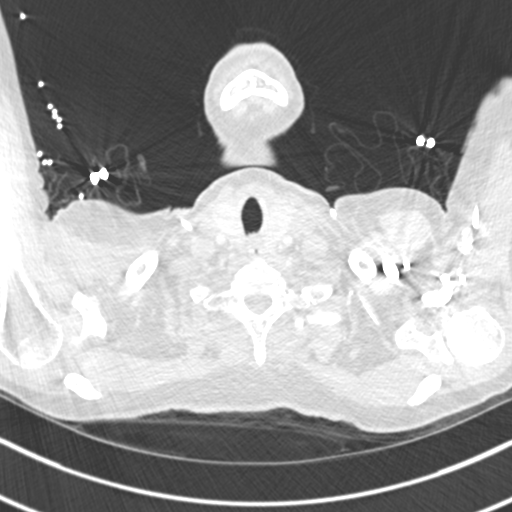

[Series 8: coronal mpr · coronal · 0.58mm/px · 1 of 129 slices shown]
[im 65/129  mediastinal]
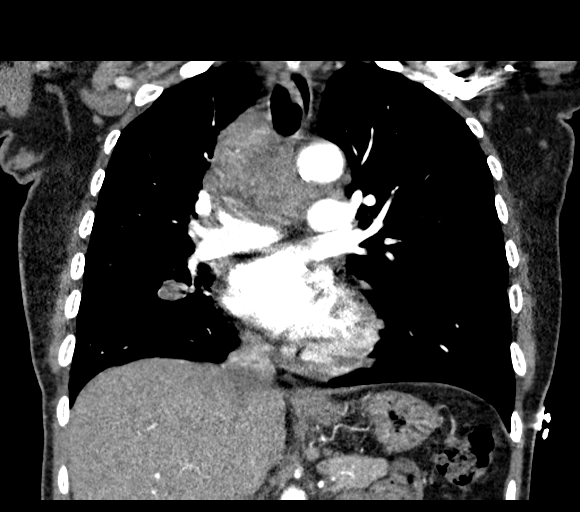

[16 of 36 positions shown; findings below may reference images not displayed]

FINDINGS: Cardiovascular: Thoracic aorta is well visualize without significant
atherosclerotic calcifications or aneurysmal dilatation. No
dissection is identified. No significant cardiac enlargement is
seen. No coronary calcifications are noted. The pulmonary artery
demonstrates a normal branching pattern. No definitive filling
defect is identified to suggest pulmonary embolism.

Mediastinum/Nodes: Soft tissue density is noted in the right
supraclavicular region which measures 3.4 by 2.5 cm. This was not as
well evaluated on the prior CT examination is consistent with nodal
metastatic disease. Right chest wall port is now seen in
satisfactory position. Considerable right paratracheal and
pretracheal adenopathy is noted this demonstrates some decrease in
size now measuring approximately 4 cm in AP dimension previously
measuring 5.2 cm. Additionally subcarinal and right hilar adenopathy
is noted. These are relatively stable from the prior exam. Anterior
mediastinal adenopathy is also noted stable from the prior exam. The
esophagus as visualized is within normal limits. Prominent lymph
nodes are noted in the left axilla largest of these measures
approximately 13 cm in short axis enlarged from previous exam.
Subcutaneous lesion in the right axilla is noted measuring 16 mm
best seen on image number 86 of series 5 which was not as well
appreciated on the prior exam due to the imaging technique.

Lungs/Pleura: Diffuse emphysematous changes are identified. The left
lung is well aerated without focal infiltrate or sizable effusion.
The previously seen soft tissue lesion along the medial aspect of
the left lower lobe is again identified and appears enlarged now
measuring 2.7 x 2.2 cm. It previously measured 1.8 x 1.0 cm. This is
again felt to be arising from the chest wall and consistent with a
focal metastatic lesion. Right middle lobe mass lesion is again
identified but appears slightly smaller now measuring 1.8 cm in
greatest dimension. This is decreased from 2.8 cm on the prior exam.
The degree of right middle lobe collapse has improved when compared
with the prior exam. The bronchial tree is now patent extending into
the right middle lobe.

Upper Abdomen: Few scattered hyperdensities are noted within the
liver likely representing hemangiomas. There stable from the prior
exam. Enlargement of both adrenal glands is now seen. The right
adrenal gland measures approximately 2.9 cm in greatest dimension.
The left adrenal gland measures approximately 3.0 cm in greatest
dimension. Given the findings in the chest this likely represents
focal metastatic disease and is new from the prior exam. Few
scattered small rounded densities are noted within the omentum which
may represent peritoneal metastatic disease. Irregularity of the
upper pole of the right kidney remains.

Musculoskeletal: Scattered lucencies are noted within the thoracic
spine as well as within the ribcage consistent with metastatic
disease. This is new from the prior exam bone scan may be helpful
for further evaluation as clinically indicated. The most prominent
of these lesions lies in the left half of the T9 vertebral body.
Better visualized on the current examination is a 3.4 cm soft tissue
lesion within the subcutaneous fat just to the right of the midline
in the midthoracic spine along the paraspinal musculature. A few
smaller subcutaneous lesions are noted in the posterior chest wall.

Review of the MIP images confirms the above findings.
IMPRESSION: No evidence of pulmonary embolism.

Findings consistent with the patient's known history of lung
carcinoma with metastatic disease. The overall size of the right
middle lobe mass lesion has decreased in the interval from the prior
exam. There has been some increase in metastatic disease
particularly in the thoracic spine and axillary regions bilaterally
as well as in the left paraspinal region and subcutaneous tissues in
the posterior chest wall. Right supraclavicular adenopathy is noted
which was not as well visualized on a prior exam due to the imaging
technique. Some decrease is noted within the pretracheal and
peritracheal adenopathy when compared with the prior exam.

Emphysema ([id number]).

## 2018-04-07 IMAGING — CR DG CHEST 1V PORT
1 series · 1 of 1 positions shown · non-contrast
Comparison: 08/28/2016

CLINICAL DATA: Acute respiratory distress

EXAM:
PORTABLE CHEST 1 VIEW

[portable]
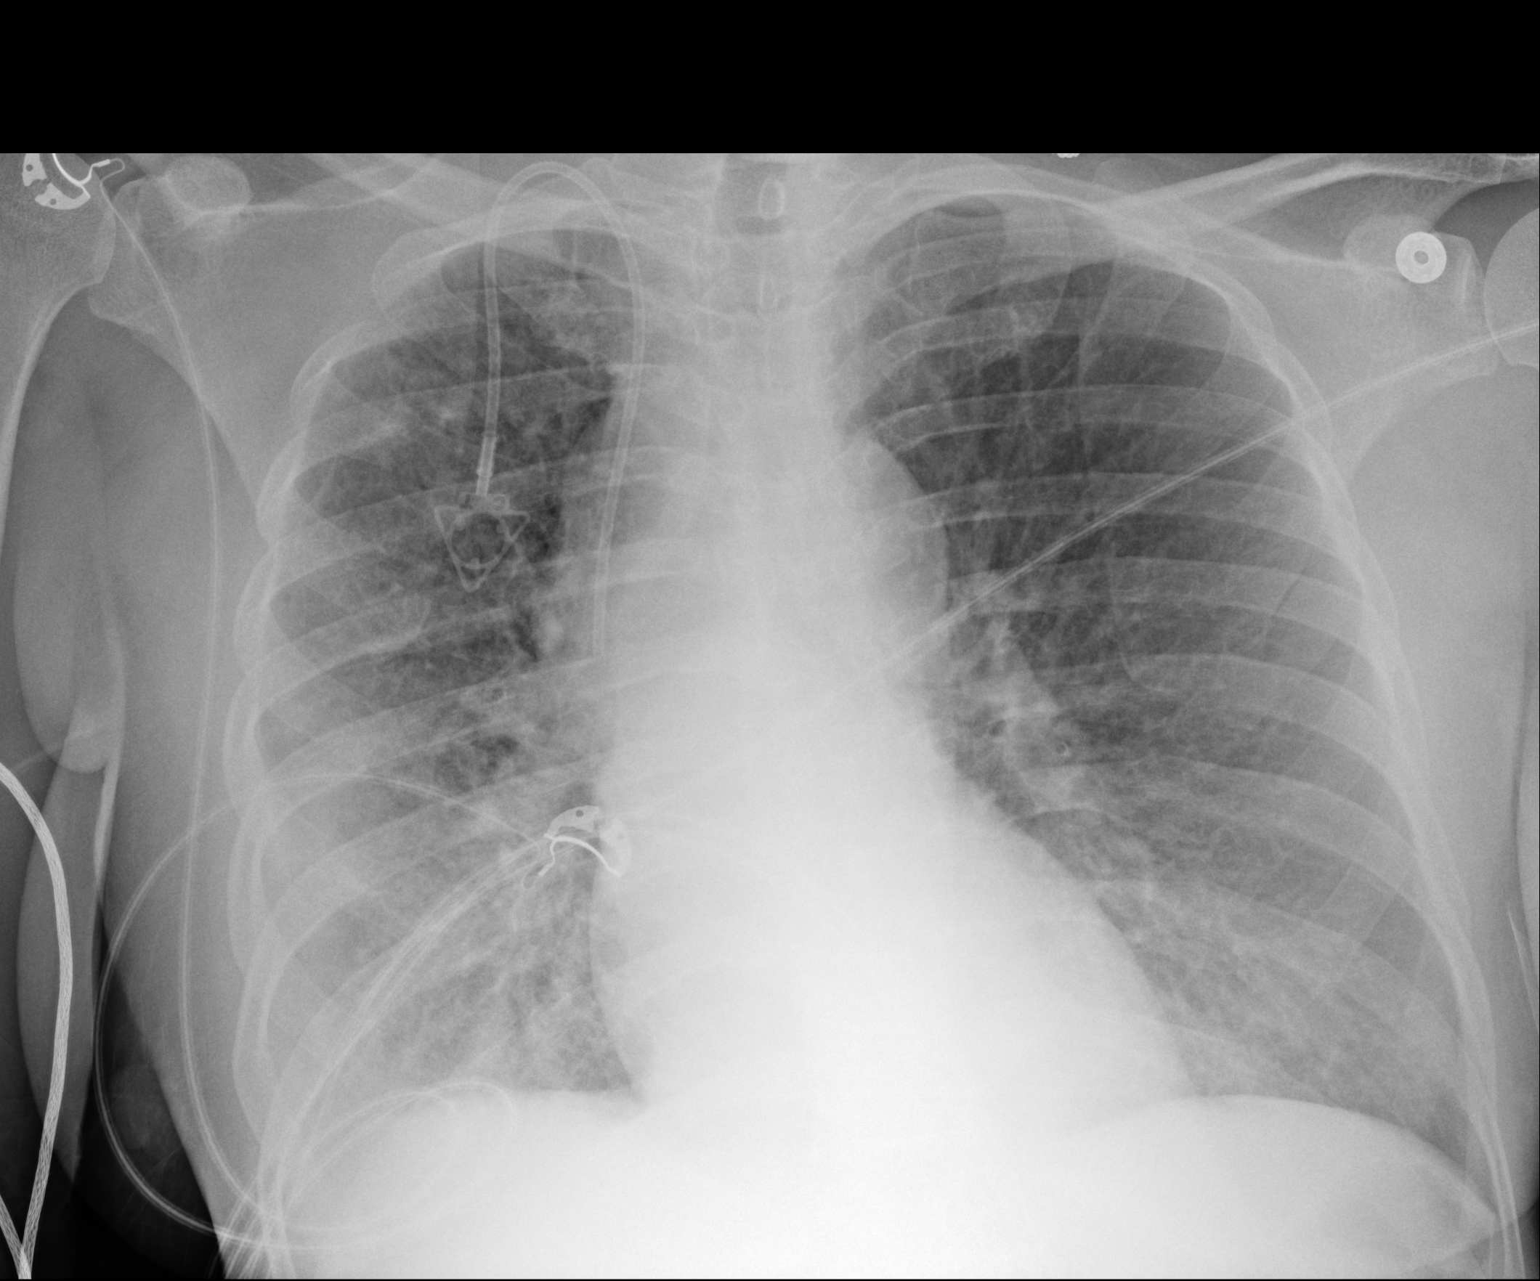

[1 of 1 positions shown; findings below may reference images not displayed]

FINDINGS: Right-sided central venous port tip overlies the SVC. Development of
bilateral right greater than left ground-glass density throughout
the lung fields. No large effusion. Stable cardiomediastinal
silhouette with enlarged mediastinal contour corresponding to CT
demonstrated mass/adenopathy. No pneumothorax.
IMPRESSION: 1. Interim development of bilateral right greater than left
ground-glass density which may be secondary to asymmetric edema, or
diffuse infection.
2. Stable enlarged mediastinal silhouette corresponding to
adenopathy demonstrated on CT
